# Patient Record
Sex: Female | Born: 1965 | Race: Black or African American | Hispanic: No | Smoking: Never smoker
Health system: Southern US, Community
[De-identification: ages and names within clinical notes are randomized; demographics above are authoritative.]

## PROBLEM LIST (undated history)

## (undated) DIAGNOSIS — I7300 Raynaud's syndrome without gangrene: Secondary | ICD-10-CM

## (undated) DIAGNOSIS — IMO0001 Reserved for inherently not codable concepts without codable children: Secondary | ICD-10-CM

## (undated) DIAGNOSIS — M349 Systemic sclerosis, unspecified: Secondary | ICD-10-CM

## (undated) DIAGNOSIS — C9000 Multiple myeloma not having achieved remission: Secondary | ICD-10-CM

## (undated) DIAGNOSIS — IMO0002 Reserved for concepts with insufficient information to code with codable children: Secondary | ICD-10-CM

## (undated) HISTORY — PX: NASAL SEPTUM SURGERY: SHX37

## (undated) HISTORY — DX: Reserved for inherently not codable concepts without codable children: IMO0001

## (undated) HISTORY — DX: Systemic sclerosis, unspecified: M34.9

## (undated) HISTORY — DX: Multiple myeloma not having achieved remission: C90.00

## (undated) HISTORY — DX: Reserved for concepts with insufficient information to code with codable children: IMO0002

## (undated) HISTORY — PX: UTERINE FIBROID EMBOLIZATION: SHX825

---

## 2013-02-14 ENCOUNTER — Emergency Department (INDEPENDENT_AMBULATORY_CARE_PROVIDER_SITE_OTHER)
Admission: EM | Admit: 2013-02-14 | Discharge: 2013-02-14 | Disposition: A | Discharge status: Home / Self Care | Payer: Self-pay | Source: Home / Self Care | Attending: Emergency Medicine | Admitting: Emergency Medicine

## 2013-02-14 ENCOUNTER — Encounter (HOSPITAL_COMMUNITY): Payer: Self-pay | Admitting: Emergency Medicine

## 2013-02-14 ENCOUNTER — Emergency Department (INDEPENDENT_AMBULATORY_CARE_PROVIDER_SITE_OTHER): Payer: Self-pay

## 2013-02-14 DIAGNOSIS — M65839 Other synovitis and tenosynovitis, unspecified forearm: Secondary | ICD-10-CM

## 2013-02-14 DIAGNOSIS — M65849 Other synovitis and tenosynovitis, unspecified hand: Secondary | ICD-10-CM

## 2013-02-14 DIAGNOSIS — M659 Synovitis and tenosynovitis, unspecified: Secondary | ICD-10-CM

## 2013-02-14 MED ORDER — METHYLPREDNISOLONE 4 MG PO KIT: PACK | ORAL | Status: DC

## 2013-02-14 NOTE — ED Provider Notes (Signed)
Medical screening examination/treatment/procedure(s) were performed by non-physician practitioner and as supervising physician I was immediately available for consultation/collaboration.  Raynald Blend, MD 02/14/13 [phone]

## 2013-02-14 NOTE — ED Provider Notes (Signed)
History    CSN: [id number] Arrival date & time 02/14/13  1243  First MD Initiated Contact with Patient 02/14/13 1408     Chief Complaint  Patient presents with  . Wrist Pain   (Consider location/radiation/quality/duration/timing/severity/associated sxs/prior Treatment) HPI  47 yo bf presents today with right wrist pain, stiffness, swelling.  States that she recently moved down to [place] and had been doing a lot of packing.  This activity aggravated current wrist symtpoms.  States that sep 2013 she did suffer a fall down some steps and landed on an outstretched right hand.  Was seen and advised that she did not have a fracture, but wrist has continued to be stiff since that injury.  Denies fever, chills, hx or inflammatory arthritis.  Pain aggravated with movement and lifting objects.    History reviewed. No pertinent past medical history. Past Surgical History  Procedure Laterality Date  . Nasal septum surgery     History reviewed. No pertinent family history. History  Substance Use Topics  . Smoking status: Never Smoker   . Smokeless tobacco: Not on file  . Alcohol Use: No   OB History   Grav Para Term Preterm Abortions TAB SAB Ect Mult Living                 Review of Systems  Constitutional: Negative.   HENT: Negative.   Eyes: Negative.   Respiratory: Negative.   Cardiovascular: Negative.   Gastrointestinal: Negative.   Endocrine: Negative.   Genitourinary: Negative.   Musculoskeletal:       Right wrist pain, swelling.    Skin: Negative.   Neurological: Negative.   Hematological: Negative.   Psychiatric/Behavioral: Negative.     Allergies  Review of patient's allergies indicates no known allergies.  Home Medications   Current Outpatient Rx  Name  Route  Sig  Dispense  Refill  . Multiple Vitamin (MULTIVITAMIN) tablet   Oral   Take 1 tablet by mouth daily.         . methylPREDNISolone (MEDROL DOSEPAK) 4 MG tablet      6 day dose pack.  Take as  directed.   21 tablet   0    BP 106/76  Pulse 73  Temp(Src) 97.8 F (36.6 C) (Oral)  Resp 16  SpO2 100% Physical Exam  Constitutional: She appears well-developed and well-nourished.  HENT:  Head: Normocephalic and atraumatic.  Eyes: Pupils are equal, round, and reactive to light.  Neck: Normal range of motion.  Cardiovascular: Normal rate and regular rhythm.   Pulmonary/Chest: Effort normal.  Musculoskeletal:  Right wrist decreased ROM.  Wrist flexor tendons tender to palpation.  Some swelling.  Positive tinel's  Pain with resisted wrist flexion.  No signs of infection.   Skin: Skin is warm and dry.  Psychiatric: She has a normal mood and affect.    ED Course  Procedures (including critical care time) Labs Reviewed - No data to display Dg Wrist Complete Right  02/14/2013   *RADIOLOGY REPORT*  Clinical Data: Wrist pain  RIGHT WRIST - COMPLETE 3+ VIEW  Comparison: None.  Findings: Four views of the wrist demonstrate no acute fracture or malalignment.  No significant widening of the scaphoid lunate interspace.  The scaphoid appears intact.  There is mild soft tissue swelling dorsal to the wrist joint.  No chondrocalcinosis. Normal bony mineralization.  Joint spaces are maintained.  There is mild volar tilt of the lunate.  IMPRESSION:  1.  No acute fracture or malalignment.  2.  Mild volar/palmar tilt of the lunate.  This can be seen with volar intercalated segmental instability (VISI), but can also be a normal finding in a relatively lax wrist.   Original Report Authenticated By: Malachy Moan, M.D.   1. Tenosynovitis, wrist     MDM  Will f/u with dr Salvatore Marvel in 1-2 weeks if continues to be symptomatic.  Instructions reviewed and she voices understanding.  Will work on ROM of her wrist.  May need formal therapy vs further imaging with MRI scan.   Meds ordered this encounter  Medications  . Multiple Vitamin (MULTIVITAMIN) tablet    Sig: Take 1 tablet by mouth daily.  .  methylPREDNISolone (MEDROL DOSEPAK) 4 MG tablet    Sig: 6 day dose pack.  Take as directed.    Dispense:  21 tablet    Refill:  0    Zonia Kief, PA-C 02/14/13 1620

## 2013-02-14 NOTE — ED Notes (Signed)
Pt c/o right wrist swelling x 3 days. Has been moving furniture and had lots of activity prior to onset of symptoms. Has used wrist bandage and Aleve for pain with relief. Was at PT today and told she should have it looked at by a doctor. Patient is alert and oriented.

## 2013-10-18 ENCOUNTER — Emergency Department (HOSPITAL_BASED_OUTPATIENT_CLINIC_OR_DEPARTMENT_OTHER)
Admission: EM | Admit: 2013-10-18 | Discharge: 2013-10-19 | Disposition: A | Discharge status: Home / Self Care | Payer: BC Managed Care – PPO | Attending: Emergency Medicine | Admitting: Emergency Medicine

## 2013-10-18 ENCOUNTER — Encounter (HOSPITAL_BASED_OUTPATIENT_CLINIC_OR_DEPARTMENT_OTHER): Payer: Self-pay | Admitting: Emergency Medicine

## 2013-10-18 ENCOUNTER — Emergency Department (HOSPITAL_BASED_OUTPATIENT_CLINIC_OR_DEPARTMENT_OTHER): Payer: BC Managed Care – PPO

## 2013-10-18 DIAGNOSIS — Y92838 Other recreation area as the place of occurrence of the external cause: Secondary | ICD-10-CM

## 2013-10-18 DIAGNOSIS — Z79899 Other long term (current) drug therapy: Secondary | ICD-10-CM | POA: Insufficient documentation

## 2013-10-18 DIAGNOSIS — S060X9A Concussion with loss of consciousness of unspecified duration, initial encounter: Secondary | ICD-10-CM | POA: Insufficient documentation

## 2013-10-18 DIAGNOSIS — W1809XA Striking against other object with subsequent fall, initial encounter: Secondary | ICD-10-CM | POA: Insufficient documentation

## 2013-10-18 DIAGNOSIS — S0990XA Unspecified injury of head, initial encounter: Secondary | ICD-10-CM

## 2013-10-18 DIAGNOSIS — Y93A1 Activity, exercise machines primarily for cardiorespiratory conditioning: Secondary | ICD-10-CM | POA: Insufficient documentation

## 2013-10-18 DIAGNOSIS — Y9239 Other specified sports and athletic area as the place of occurrence of the external cause: Secondary | ICD-10-CM | POA: Insufficient documentation

## 2013-10-18 MED ORDER — IBUPROFEN 800 MG PO TABS
ORAL_TABLET | ORAL | Status: AC
  Administered 2013-10-19: 800 mg
  Filled 2013-10-18: qty 1

## 2013-10-18 MED ORDER — ACETAMINOPHEN 325 MG PO TABS
ORAL_TABLET | ORAL | Status: AC
  Filled 2013-10-18: qty 2

## 2013-10-18 NOTE — ED Provider Notes (Signed)
CSN: [id number]     Arrival date & time 10/18/13  2302 History  This chart was scribed for Lety Cullens Alfonso Patten, MD by Eston Mould, ED Scribe. This patient was seen in room MH10/MH10 and the patient's care was started at 11:50 PM.   Chief Complaint  Patient presents with  . Headache   Patient is a 48 y.o. female presenting with headaches. The history is provided by the patient. No language interpreter was used.  Headache Pain location:  Generalized Quality:  Sharp and dull Radiates to:  Does not radiate Onset quality:  Gradual Duration:  2 weeks Timing:  Constant Progression:  Waxing and waning Chronicity:  New Context: not activity, not exposure to bright light, not caffeine, not eating, not intercourse and not loud noise   Relieved by:  Nothing Worsened by:  Nothing tried Ineffective treatments:  None tried Associated symptoms: no abdominal pain, no cough, no fever, no nausea, no neck pain, no neck stiffness, no numbness, no photophobia, no seizures, no sore throat, no swollen glands, no visual change and no vomiting   Risk factors: no family hx of SAH    HPI Comments: Cameo Schmiesing is a 48 y.o. female who presents to the Emergency Department complaining of waxing and waning HA that began 2 weeks ago. Pt states about 2 weeks ago she fell while at the gym and states she LOC but is unsure how long. She states she frequently goes to the gym and sits in the sauna for long periods of time. Pt denies taking any OTC medications. Pt denies nausea and emesis.   History reviewed. No pertinent past medical history. Past Surgical History  Procedure Laterality Date  . Nasal septum surgery     History reviewed. No pertinent family history. History  Substance Use Topics  . Smoking status: Never Smoker   . Smokeless tobacco: Not on file  . Alcohol Use: No   OB History   Grav Para Term Preterm Abortions TAB SAB Ect Mult Living                 Review of Systems   Constitutional: Negative for fever.  HENT: Negative for sore throat.   Eyes: Negative for photophobia.  Respiratory: Negative for cough.   Gastrointestinal: Negative for nausea, vomiting and abdominal pain.  Musculoskeletal: Negative for neck pain and neck stiffness.  Neurological: Positive for headaches. Negative for seizures, facial asymmetry, speech difficulty, weakness, light-headedness and numbness.  All other systems reviewed and are negative.    Allergies  Review of patient's allergies indicates no known allergies.  Home Medications   Current Outpatient Rx  Name  Route  Sig  Dispense  Refill  . methylPREDNISolone (MEDROL DOSEPAK) 4 MG tablet      6 day dose pack.  Take as directed.   21 tablet   0   . Multiple Vitamin (MULTIVITAMIN) tablet   Oral   Take 1 tablet by mouth daily.          There were no vitals taken for this visit.  Physical Exam  Constitutional: She is oriented to person, place, and time. She appears well-developed and well-nourished. No distress.  sITTING COMFORTABLY IN THE ROOM WITH THE LIGHTS ON  HENT:  Head: Normocephalic and atraumatic. Head is without raccoon's eyes and without Battle's sign.  Right Ear: No mastoid tenderness. Tympanic membrane is not injected. No hemotympanum.  Left Ear: No mastoid tenderness. Tympanic membrane is not injected. No hemotympanum.  Mouth/Throat: Oropharynx is clear and  moist. No oropharyngeal exudate.  No hemotympanum L and R. Mastoids are normal. Trachea is midline.  nO TEMPORAL TENDERNESS  Eyes: EOM are normal. Pupils are equal, round, and reactive to light.  Neck: Normal range of motion. Neck supple. No thyromegaly present.  No meningismus .   Cardiovascular: Normal rate and normal heart sounds.   Pulmonary/Chest: Effort normal and breath sounds normal. No stridor. She has no wheezes. She has no rales.  Abdominal: Soft. Bowel sounds are normal. There is no tenderness. There is no rebound and no guarding.   Soft non-tender abdomen.  Musculoskeletal: Normal range of motion. She exhibits no edema and no tenderness.  No midline or stepoffs. C-spine normal. 5/5 strength and sensation to extremities.  Neurological: She is alert and oriented to person, place, and time. She has normal reflexes. She displays normal reflexes. No cranial nerve deficit. Coordination normal.  Cranial nerves 1-12.   Skin: Skin is warm and dry. She is not diaphoretic.  Psychiatric: She has a normal mood and affect.    ED Course  Procedures  DIAGNOSTIC STUDIES:   COORDINATION OF CARE: 11:55 PM-Discussed treatment plan which includes informed pt will return to her room when CT results are in and will administer pain medication while in ED. Pt agreed to plan.   Labs Review Labs Reviewed - No data to display Imaging Review No results found.   EKG Interpretation None     MDM   Final diagnoses:  None   Post concussive syndrome Patient has tried nothing, have advised tylenol and motrin.  Follow up with your family doctor, no activity until symptoms resolve  No exam data present I personally performed the services described in this documentation, which was scribed in my presence. The recorded information has been reviewed and is accurate.     Carlisle Beers, MD 10/19/13 [phone]

## 2013-10-18 NOTE — ED Notes (Signed)
Pt states that she fell at the gym 2 weeks ago, hit right parietal/occipatal side of head on treadmill, states that she lost consiousness but unsure how long she was out, was helped up by other members to lounge, she sat there and felt much better. Pt states prior to episode she had not eaten and had sat in sauna for a longer period of time. Today presents because of headache that  Waxes and wanes to frontal portion of head and back of head.

## 2013-10-19 MED ORDER — ACETAMINOPHEN 500 MG PO TABS
ORAL_TABLET | ORAL | Status: AC
Start: 2013-10-19 — End: 2013-10-19
  Administered 2013-10-19: 1000 mg
  Filled 2013-10-19: qty 2

## 2013-10-19 NOTE — Discharge Instructions (Signed)
Concussion, Adult  A concussion is a brain injury. It is caused by:  · A hit to the head.  · A quick and sudden movement (jolt) of the head or neck.  A concussion is usually not life-threatening. Even so, it can cause serious problems. If you had a concussion before, you may have concussion-like problems after a hit to your head.  HOME CARE  General Instructions  · Follow your doctor's directions carefully.  · Take medicines only as told by your doctor.  · Only take medicines your doctor says are safe.  · Do not drink alcohol until your doctor says it is OK. Alcohol and some drugs can slow down healing. They can also put you at risk for further injury.  · If your are having trouble remembering things, write them down.  · Try to do one thing at a time if you get distracted easily. For example, do not watch TV while making dinner.  · Talk to your family members or close friends when making important decisions.  · Follow up with your doctor as told.  · Watch your symptoms. Tell others to do the same. Serious problems can sometimes happen after a concussion. Older adults are more likely to have these problems.  · Tell your teachers, school nurse, school counselor, coach, athletic trainer, or work manager about your concussion. Tell them about what you can or cannot do. They should watch to see if:  · It gets even harder for you to pay attention or concentrate.  · It gets even harder for you to remember things or learn new things.  · You need more time than normal to finish things.  · You become annoyed (irritable) more than before.  · You are not able to deal with stress as well.  · You have more problems than before.  · Rest. Make sure you:  · Get plenty of sleep at night.  · Go to sleep early.  · Go to bed at the same time every day. Try to wake up at the same time.  · Rest during the day.  · Take naps when you feel tired.  · Limit activities where you have to think a lot or concentrate. These include:  · Doing  homework.  · Doing work related to a job.  · Watching TV.  · Using the computer.  Returning To Your Regular Activities  Return to your normal activities slowly, not all at once. You must give your body and brain enough time to heal.   · Do not play sports or do other athletic activities until your doctor says it is OK.  · Ask your doctor when you can drive, ride a bicycle, or work other vehicles or machines. Never do these things if you feel dizzy.  · Ask your doctor about when you can return to work or school.  Preventing Another Concussion  It is very important to avoid another brain injury, especially before you have healed. In rare cases, another injury can lead to permanent brain damage, brain swelling, or death. The risk of this is greatest during the first 7 10 after your injury. Avoid injuries by:   · Wearing a seat belt when riding in a car.  · Not drinking too much alcohol.  · Avoiding activities that could lead to a second concussion (such as contact sports).  · Wearing a helmet when doing activities like:  · Biking.  · Skiing.  · Skateboarding.  · Skating.  ·   Making your home safer by:  · Removing things from the floor or stairways that could make you trip.  · Using grab bars in bathrooms and handrails by stairs.  · Placing non-slip mats on floors and in bathtubs.  · Improve lighting in dark areas.  GET HELP IF:  · It gets even harder for you to pay attention or concentrate.  · It gets even harder for you to remember things or learn new things.  · You need more time than normal to finish things.  · You become annoyed (irritable) more than before.  · You are not able to deal with stress as well.  · You have more problems than before.  · You have problems keeping your balance.  · You are not able to react quickly when you should.  Get help if you have any of these problems for more than 2 weeks:   · Lasting (chronic) headaches.  · Dizziness or trouble balancing.  · Feeling sick to your stomach  (nausea).  · Seeing (vision) problems.  · Being affected by noises or light more than normal.  · Feeling sad, low, down in the dumps, blue, gloomy, or empty (depressed).  · Mood changes (mood swings).  · Feeling of fear or nervousness about what may happen (anxiety).  · Feeling annoyed.  · Memory problems.  · Problems concentrating or paying attention.  · Sleep problems.  · Feeling tired all the time.  GET HELP RIGHT AWAY IF:   · You have bad headaches or your headaches get worse.  · You have weakness (even if it is in one hand, leg, or part of the face).  · You have loss of feeling (numbness).  · You feel off balance.  · You keep throwing up (vomiting).  · You feel tired.  · One black center of your eye (pupil) is larger than the other.  · You twitch or shake violently (convulse).  · Your speech is not clear (slurred).  · You are more confused, easily angered (agitated), or annoyed than before.  · You have more trouble resting than before.  · You are unable to recognize people or places.  · You have neck pain.  · It is difficult to wake you up.  · You have unusual behavior changes.  · You pass out (lose consciousness).  MAKE SURE YOU:   · Understand these instructions.  · Will watch your condition.  · Will get help right away if you are not doing well or get worse.  Document Released: 07/27/2009 Document Revised: 05/29/2013 Document Reviewed: 02/28/2013  [employer]® Patient Information ©2014 [employer].

## 2014-06-27 ENCOUNTER — Ambulatory Visit (INDEPENDENT_AMBULATORY_CARE_PROVIDER_SITE_OTHER): Payer: BC Managed Care – PPO | Admitting: Emergency Medicine

## 2014-06-27 ENCOUNTER — Other Ambulatory Visit: Payer: Self-pay | Admitting: Emergency Medicine

## 2014-06-27 ENCOUNTER — Ambulatory Visit (INDEPENDENT_AMBULATORY_CARE_PROVIDER_SITE_OTHER): Payer: BC Managed Care – PPO

## 2014-06-27 VITALS — BP 131/85 | HR 101 | Temp 98.0°F | Resp 18 | Ht 64.0 in | Wt 142.6 lb

## 2014-06-27 DIAGNOSIS — M349 Systemic sclerosis, unspecified: Secondary | ICD-10-CM

## 2014-06-27 DIAGNOSIS — M25512 Pain in left shoulder: Secondary | ICD-10-CM

## 2014-06-27 LAB — POCT CBC
Granulocyte percent: 66.3 %G (ref 37–80)
HCT, POC: 37 % — AB (ref 37.7–47.9)
Hemoglobin: 11.2 g/dL — AB (ref 12.2–16.2)
Lymph, poc: 3.2 (ref 0.6–3.4)
MCH: 29.6 pg (ref 27–31.2)
MCHC: 30.2 g/dL — AB (ref 31.8–35.4)
MCV: 97.7 fL — AB (ref 80–97)
MID (CBC): 0.8 (ref 0–0.9)
MPV: 6.7 fL (ref 0–99.8)
PLATELET COUNT, POC: 301 10*3/uL (ref 142–424)
POC Granulocyte: 7.9 — AB (ref 2–6.9)
POC LYMPH %: 27.2 % (ref 10–50)
POC MID %: 6.5 %M (ref 0–12)
RBC: 3.79 M/uL — AB (ref 4.04–5.48)
RDW, POC: 15.4 %
WBC: 11.9 10*3/uL — AB (ref 4.6–10.2)

## 2014-06-27 NOTE — Progress Notes (Addendum)
Subjective:    Patient ID: Samantha Moyer, female    DOB: 06/29/1966, 48 y.o.   MRN: [id number] This chart was scribed for [place]. Everlene Farrier, MD by Steva Colder, ED Scribe. The patient was seen in room 9 at 7:30 PM.   Chief Complaint  Patient presents with  . Arm Pain    l arm    HPI Samantha Moyer is a 48 y.o. female who presents today complaining of left arm pain onset 1 week. She states that she went to [facility] and was Rx for her left arm pain with no relief. She states that she didn't get an X-ray. She states that any movement or lifting over five lbs causing her pain. She denies hip pain and any other symptoms.   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 She states that no one follows her for her condition. 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 She states that she was informed by her doctors to move to a warmer climate. She states that her scleroderma comes on by stress. She states that she caught the H1N1 virus in 2009 and was intubated for 5 days and that is when she was dx with scelroderma. She states that she has an appointment in December.    There are no active problems to display for this patient.  Past Medical History  Diagnosis Date  . Scleroderma    Past Surgical History  Procedure Laterality Date  . Nasal septum surgery     No Known Allergies Prior to Admission medications   Medication Sig Start Date End Date Taking? Authorizing Provider  methylPREDNISolone (MEDROL DOSEPAK) 4 MG tablet 6 day dose pack.  Take as directed. 02/14/13   Benjiman Core, PA-C  Multiple Vitamin (MULTIVITAMIN) tablet Take 1 tablet by mouth daily.    Historical Provider, MD     Review of Systems  Musculoskeletal: Positive for arthralgias (left shoulder ).       No hip pain  All other systems reviewed and are negative.      Objective:   Physical Exam  Constitutional: She is  oriented to person, place, and time. She appears well-developed and well-nourished. No distress.  Alert and cooperative  HENT:  Head: Normocephalic and atraumatic.  Eyes: EOM are normal.  Neck: Neck supple. No tracheal deviation present.  Cardiovascular: Normal rate, regular rhythm and normal heart sounds.   Pulmonary/Chest: Effort normal. No respiratory distress.  Musculoskeletal: Normal range of motion. She exhibits tenderness.  Tenderness with any abduction of the L shoulder. Pain with internal and external rotation of the L shoulder. Tenderness of the proximal L arm. Resorption of the bone to the tips of all fingers. Radial and ulnar pulses are nl.   Neurological: She is alert and oriented to person, place, and time.  Skin: Skin is warm and dry.  Psychiatric: She has a normal mood and affect. Her behavior is normal.  Nursing note and vitals reviewed.    [facility] reading (PRIMARY) by  Dr. Fredrich Birks appears to be a radiolucency involving the humerus head. Normal bony architecture is gone very concerning for a lytic lesion please comment  Results for orders placed or performed in visit on 06/27/14  POCT CBC  Result Value Ref Range   WBC 11.9 (A) 4.6 - 10.2 K/uL   Lymph, poc 3.2 0.6 - 3.4   POC LYMPH  PERCENT 27.2 10 - 50 %L   MID (cbc) 0.8 0 - 0.9   POC MID % 6.5 0 - 12 %M   POC Granulocyte 7.9 (A) 2 - 6.9   Granulocyte percent 66.3 37 - 80 %G   RBC 3.79 (A) 4.04 - 5.48 M/uL   Hemoglobin 11.2 (A) 12.2 - 16.2 g/dL   HCT, POC 37.0 (A) 37.7 - 47.9 %   MCV 97.7 (A) 80 - 97 fL   MCH, POC 29.6 27 - 31.2 pg   MCHC 30.2 (A) 31.8 - 35.4 g/dL   RDW, POC 15.4 %   Platelet Count, POC 301 142 - 424 K/uL   MPV 6.7 0 - 99.8 fL     BP 131/85 mmHg  Pulse 101  Temp(Src) 98 F (36.7 C) (Oral)  Resp 18  Wt 142 lb 9.6 oz (64.683 kg)  Assessment & Plan:  COORDINATION OF CARE: 7:41 PM-Discussed treatment plan which includes left shoulder and left arm X-ray with pt at bedside and pt agreed to  plan. X-ray reveals a large lytic lesion involving the humerus. I did not see any suspicious lesions on chest x-ray. Differential includes cancer versus aseptic changes versus infection. We'll proceed with MRI of the shoulder referral to Dr. Amil Amen regarding her scleroderma referral to orthopedic surgery.  I personally performed the services described in this documentation, which was scribed in my presence. The recorded information has been reviewed and is accurate.

## 2014-06-28 LAB — COMPLETE METABOLIC PANEL WITH GFR
ALK PHOS: 81 U/L (ref 39–117)
ALT: 14 U/L (ref 0–35)
AST: 21 U/L (ref 0–37)
Albumin: 3.1 g/dL — ABNORMAL LOW (ref 3.5–5.2)
BUN: 11 mg/dL (ref 6–23)
CO2: 29 mEq/L (ref 19–32)
CREATININE: 0.8 mg/dL (ref 0.50–1.10)
Calcium: 9.7 mg/dL (ref 8.4–10.5)
Chloride: 99 mEq/L (ref 96–112)
GFR, Est African American: >89 mL/min
GFR, Est Non African American: 86 mL/min
Glucose, Bld: 86 mg/dL (ref 70–99)
Potassium: 4.4 mEq/L (ref 3.5–5.3)
Sodium: 139 mEq/L (ref 135–145)
Total Bilirubin: 0.2 mg/dL (ref 0.2–1.2)
Total Protein: 9.7 g/dL — ABNORMAL HIGH (ref 6.0–8.3)

## 2014-06-28 LAB — SEDIMENTATION RATE: SED RATE: 118 mm/h — AB (ref 0–22)

## 2014-07-01 ENCOUNTER — Telehealth: Payer: Self-pay | Admitting: Physician Assistant

## 2014-07-01 DIAGNOSIS — Z1239 Encounter for other screening for malignant neoplasm of breast: Secondary | ICD-10-CM

## 2014-07-01 LAB — PROTEIN, TOTAL: TOTAL PROTEIN: 9.7 g/dL — AB (ref 6.0–8.3)

## 2014-07-01 MED ORDER — HYDROCODONE-ACETAMINOPHEN 5-325 MG PO TABS: 1.0000 | ORAL_TABLET | Freq: Four times a day (QID) | ORAL | Status: DC | PRN

## 2014-07-01 MED ORDER — TRAMADOL HCL 50 MG PO TABS: 50.0000 mg | ORAL_TABLET | Freq: Three times a day (TID) | ORAL | Status: DC | PRN

## 2014-07-01 NOTE — Telephone Encounter (Signed)
Lm for pt to pick up scripts- rtn call if any questions.

## 2014-07-01 NOTE — Telephone Encounter (Signed)
Pt is only taking Naproxen. She is having trouble sleeping at night. She is afraid it is getting worse. She can not stand the feeling like her arm is coming out of socket. She would like something stronger for during the day that she will still be able to function and something for night.

## 2014-07-01 NOTE — Telephone Encounter (Signed)
Absolutely.  She will need to come into the office to pick up a Rx.

## 2014-07-01 NOTE — Telephone Encounter (Signed)
Patient called back has mri scheduled for this Thursday the 12th. She is having problems at night sleeping cause of the pain- she says this is because her bone is deterioting or not enough blood in bone- per radiologist. She has medication but the pain makes it very difficult to sleep; she says it feels like her bone is tearing on the the inside for about 3 weeks. What can we do to help her through this, please advise.   Best: [phone]

## 2014-07-01 NOTE — Telephone Encounter (Signed)
LMOM about getting patient to call and schedule her MRI.  Asked her to please call us back if she was having problems.

## 2014-07-02 ENCOUNTER — Ambulatory Visit (INDEPENDENT_AMBULATORY_CARE_PROVIDER_SITE_OTHER): Payer: BC Managed Care – PPO | Admitting: Family Medicine

## 2014-07-02 ENCOUNTER — Encounter (HOSPITAL_COMMUNITY): Payer: Self-pay | Admitting: Emergency Medicine

## 2014-07-02 ENCOUNTER — Ambulatory Visit (INDEPENDENT_AMBULATORY_CARE_PROVIDER_SITE_OTHER): Payer: BC Managed Care – PPO

## 2014-07-02 ENCOUNTER — Emergency Department (HOSPITAL_COMMUNITY)
Admission: EM | Admit: 2014-07-02 | Discharge: 2014-07-03 | Disposition: A | Discharge status: Home / Self Care | Payer: BC Managed Care – PPO | Attending: Emergency Medicine | Admitting: Emergency Medicine

## 2014-07-02 ENCOUNTER — Other Ambulatory Visit: Payer: BC Managed Care – PPO

## 2014-07-02 VITALS — BP 100/60 | HR 75 | Temp 97.6°F | Resp 16 | Ht 64.5 in | Wt 141.0 lb

## 2014-07-02 DIAGNOSIS — R51 Headache: Secondary | ICD-10-CM | POA: Insufficient documentation

## 2014-07-02 DIAGNOSIS — M899 Disorder of bone, unspecified: Secondary | ICD-10-CM | POA: Insufficient documentation

## 2014-07-02 DIAGNOSIS — R5383 Other fatigue: Secondary | ICD-10-CM

## 2014-07-02 DIAGNOSIS — T300 Burn of unspecified body region, unspecified degree: Secondary | ICD-10-CM

## 2014-07-02 DIAGNOSIS — R112 Nausea with vomiting, unspecified: Secondary | ICD-10-CM | POA: Diagnosis not present

## 2014-07-02 DIAGNOSIS — R519 Headache, unspecified: Secondary | ICD-10-CM

## 2014-07-02 DIAGNOSIS — Z79899 Other long term (current) drug therapy: Secondary | ICD-10-CM | POA: Diagnosis not present

## 2014-07-02 DIAGNOSIS — R42 Dizziness and giddiness: Secondary | ICD-10-CM

## 2014-07-02 DIAGNOSIS — M349 Systemic sclerosis, unspecified: Secondary | ICD-10-CM

## 2014-07-02 DIAGNOSIS — R110 Nausea: Secondary | ICD-10-CM

## 2014-07-02 DIAGNOSIS — IMO0002 Reserved for concepts with insufficient information to code with codable children: Secondary | ICD-10-CM

## 2014-07-02 DIAGNOSIS — R142 Eructation: Secondary | ICD-10-CM

## 2014-07-02 DIAGNOSIS — R012 Other cardiac sounds: Secondary | ICD-10-CM

## 2014-07-02 LAB — COMPREHENSIVE METABOLIC PANEL
ALK PHOS: 85 U/L (ref 39–117)
ALT: 11 U/L (ref 0–35)
AST: 21 U/L (ref 0–37)
Albumin: 2.8 g/dL — ABNORMAL LOW (ref 3.5–5.2)
Anion gap: 16 — ABNORMAL HIGH (ref 5–15)
BILIRUBIN TOTAL: 0.3 mg/dL (ref 0.3–1.2)
BUN: 10 mg/dL (ref 6–23)
CHLORIDE: 97 meq/L (ref 96–112)
CO2: 27 meq/L (ref 19–32)
CREATININE: 0.61 mg/dL (ref 0.50–1.10)
Calcium: 9.9 mg/dL (ref 8.4–10.5)
GFR calc Af Amer: >90 mL/min (ref >90)
Glucose, Bld: 84 mg/dL (ref 70–99)
POTASSIUM: 4.2 meq/L (ref 3.7–5.3)
Sodium: 140 mEq/L (ref 137–147)
Total Protein: 9.6 g/dL — ABNORMAL HIGH (ref 6.0–8.3)

## 2014-07-02 LAB — CBC WITH DIFFERENTIAL/PLATELET
BASOS ABS: 0 10*3/uL (ref 0.0–0.1)
Basophils Relative: 0 % (ref 0–1)
Eosinophils Absolute: 0.1 10*3/uL (ref 0.0–0.7)
Eosinophils Relative: 1 % (ref 0–5)
HEMATOCRIT: 34.4 % — AB (ref 36.0–46.0)
HEMOGLOBIN: 10.9 g/dL — AB (ref 12.0–15.0)
LYMPHS PCT: 24 % (ref 12–46)
Lymphs Abs: 2.8 10*3/uL (ref 0.7–4.0)
MCH: 30.7 pg (ref 26.0–34.0)
MCHC: 31.7 g/dL (ref 30.0–36.0)
MCV: 96.9 fL (ref 78.0–100.0)
MONO ABS: 0.7 10*3/uL (ref 0.1–1.0)
Monocytes Relative: 7 % (ref 3–12)
NEUTROS ABS: 7.8 10*3/uL — AB (ref 1.7–7.7)
NEUTROS PCT: 68 % (ref 43–77)
Platelets: 282 10*3/uL (ref 150–400)
RBC: 3.55 MIL/uL — ABNORMAL LOW (ref 3.87–5.11)
RDW: 12.9 % (ref 11.5–15.5)
WBC: 11.4 10*3/uL — AB (ref 4.0–10.5)

## 2014-07-02 LAB — TROPONIN I

## 2014-07-02 MED ORDER — ONDANSETRON 4 MG PO TBDP
4.0000 mg | ORAL_TABLET | Freq: Once | ORAL | Status: AC
  Administered 2014-07-02: 4 mg via ORAL
  Filled 2014-07-02: qty 1

## 2014-07-02 MED ORDER — SILVER SULFADIAZINE 1 % EX CREA: 1.0000 "application " | TOPICAL_CREAM | Freq: Every day | CUTANEOUS | Status: DC

## 2014-07-02 MED ORDER — ONDANSETRON HCL 4 MG/2ML IJ SOLN
4.0000 mg | Freq: Once | INTRAMUSCULAR | Status: DC
  Filled 2014-07-02: qty 2

## 2014-07-02 MED ORDER — MECLIZINE HCL 25 MG PO TABS: 25.0000 mg | ORAL_TABLET | Freq: Once | ORAL | Status: DC

## 2014-07-02 MED ORDER — SODIUM CHLORIDE 0.9 % IV SOLN
1000.0000 mL | INTRAVENOUS | Status: DC
  Administered 2014-07-03: 1000 mL via INTRAVENOUS

## 2014-07-02 MED ORDER — SODIUM CHLORIDE 0.9 % IV SOLN
1000.0000 mL | Freq: Once | INTRAVENOUS | Status: AC
  Administered 2014-07-03: 1000 mL via INTRAVENOUS

## 2014-07-02 MED ORDER — ONDANSETRON 4 MG PO TBDP
4.0000 mg | ORAL_TABLET | Freq: Once | ORAL | Status: AC
  Administered 2014-07-02: 4 mg via ORAL

## 2014-07-02 NOTE — ED Notes (Signed)
Pt. reports nausea , vomitting , lightheaded with headache onset today seen at [facility]  today sent here for further evaluation .

## 2014-07-02 NOTE — Telephone Encounter (Signed)
If the nausea and dizziness persist, she should be evaluated either here or in the ED.  If it's gone, I recommend that she try either 1/2 dose of the tramadol, or try the hydrocodone (she could start with a half dose of that if desired).  We expect to know more when we get the MRI results.

## 2014-07-02 NOTE — Telephone Encounter (Signed)
Pt called again. States she took the Tramadol last night at 9 and woke up this morning feeling very nauseous and dizzy. Has not taken the Hydrocodone. Pt doesn't know what to do. Please advise. Thanks

## 2014-07-02 NOTE — Progress Notes (Addendum)
Subjective:   This chart was scribed for Samantha Agreste, MD at [facility] by Rayfield Citizen, medical scribe. This patient was seen in room 10 and the patient's care was started at 6:41 PM.     Patient ID: Samantha Moyer, female    DOB: 07-21-1966, 48 y.o.   MRN: [id number]  Chief Complaint  Patient presents with  . Nausea    From medication   There are no active problems to display for this patient.  Past Medical History  Diagnosis Date  . Scleroderma    Past Surgical History  Procedure Laterality Date  . Nasal septum surgery     No Known Allergies Prior to Admission medications   Medication Sig Start Date End Date Taking? Authorizing Provider  HYDROcodone-acetaminophen (NORCO/VICODIN) 5-325 MG per tablet Take 1 tablet by mouth every 6 (six) hours as needed for moderate pain. 07/01/14  Yes Sarah Alleen Borne, PA-C  methylPREDNISolone (MEDROL DOSEPAK) 4 MG tablet 6 day dose pack.  Take as directed. 02/14/13  Yes Benjiman Core, PA-C  Multiple Vitamin (MULTIVITAMIN) tablet Take 1 tablet by mouth daily.   Yes Historical Provider, MD  traMADol (ULTRAM) 50 MG tablet Take 1 tablet (50 mg total) by mouth every 8 (eight) hours as needed. 07/01/14  Yes Mancel Bale, PA-C   History   Social History  . Marital Status: Divorced    Spouse Name: N/A    Number of Children: N/A  . Years of Education: N/A   Occupational History  . Not on file.   Social History Main Topics  . Smoking status: Never Smoker   . Smokeless tobacco: Not on file  . Alcohol Use: No  . Drug Use: No  . Sexual Activity: Not on file   Other Topics Concern  . Not on file   Social History Narrative   HPI  Samantha Moyer is a 48 y.o. female  PCP: Salena Saner., MD  Patient was seen 5 days PTA by Dr. Everlene Farrier for left shoulder pain and left arm pain. History of scleroderma without specific treatment. She had an x-ray that showed a lytic lesion involving the humerus. Scheduled for an MRI of the  shoulder for further evaluation and was referred to Dr. Amil Amen for the scleroderma as well as orthopedic surgery for the shoulder pain. MRI is scheduled for tomorrow; see multiple phone messages in chart. (Patient notes that this was rescheduled for today but she was unable to make the appointment due to symptoms below.) Prescribed hydrocodone for increased pain. Phone call this morning; reported that the tramadol caused her to feel nauseous and dizzy.   HPI Comments: Samantha Moyer is a 48 y.o. female with past medical history of scleroderma who presents to the [facility] complaining of 1 day of nausea and lightheadedness, worsening on ambulation. She took one dose of the tramadol at 21:30 last night; she has not taken the hydrocodone. She reports that her symptoms began this morning, about thirty minutes after waking; she was reading at onset. She ate a light meal at lunch and vomited 1x; she has been drinking small amounts of water. She denies diarrhea, urinary symptoms (dysuria, hematuria). 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   She also reports an intermittent frontal and right temple headache, described as a "brain freeze" sensation, for the past several weeks. The headaches resurfaced this afternoon and  were similar to prior headaches. She has a small burn from a hair styling injury on her scalp; she reports no change in her headache symptoms from this burn. She denies slurred speech or hemiparesis. She has no temporal headache at this time.   She also reports flushed feelings and sweating, particularly on her arms and groin, beginning last night before bed and continuing today.   She reports that she has been somnolent and fatigued today; she is concerned about falling asleep tonight. She lives alone and this bothers her in particular.   Patient  reports a history of a heart murmur; when she was "in [place], they told her there was a skip," based on an EKG. She denies any chest pain, pressure or heaviness.   Review of Systems  Cardiovascular: Negative for chest pain.  Gastrointestinal: Positive for nausea and vomiting. Negative for diarrhea.  Genitourinary: Negative for dysuria and hematuria.  Neurological: Positive for light-headedness and headaches. Negative for speech difficulty and weakness.      Objective:   Physical Exam  Constitutional: She is oriented to person, place, and time. She appears well-developed and well-nourished.  HENT:  Head: Normocephalic and atraumatic.  No tenderness along the temple or along the frontal sinuse; moist oral mucosa, no intraoral lesions  Eyes:  Slight headache with EOM testing; no nystagmus   Neck: No tracheal deviation present.  Cardiovascular: Normal rate.   Split S1 but otherwise no murmur  Pulmonary/Chest: Effort normal.  Abdominal: Soft. She exhibits no distension. There is no tenderness.  Neurological: She is alert and oriented to person, place, and time.  Grip strength equal; unsteady on Rhomberg but corrects; no pronator drift (guarding with left shoulder pain in elevation of left arm)   Skin: Skin is warm and dry.  Neck: No visible rash but faint erythema on anterior lateral neck Scalp: small (approx 1cm) second degree burn of the right frontal scalp Hands: callused ulcers on hands and clubbing on fingertips  Psychiatric: She has a normal mood and affect. Her behavior is normal.  Nursing note and vitals reviewed.  7:40 PM EKG; sinus rhythm; RSR prime in V1; left atrial enlargement; no acute findings otherwise   Filed Vitals:   07/02/14 1754  BP: 100/60  Pulse: 75  Temp: 97.6 F (36.4 C)  TempSrc: Oral  Resp: 16  Height: 5' 4.5" (1.638 m)  Weight: 141 lb (63.957 kg)  SpO2: 98%      Assessment & Plan:   7:19 PM Discussed coordination of care; patient will travel in a  private vehicle to the ED for further testing (including blood work, as patient is a tough stick). Will check EKG and abdominal xray in office.   8:18 PM [facility] Primary reading by Dr. Carlota Raspberry for acute abdominal series. Nonspecific bowel gas findings with suspected gastric bubble on left.   Meline Russaw is a 48 y.o. female Dizziness - Plan: EKG 12-Lead  Non-intractable vomiting with nausea, vomiting of unspecified type - Plan: ondansetron (ZOFRAN-ODT) disintegrating tablet 4 mg, DG Abd Acute W/Chest  Nonintractable headache, unspecified chronicity pattern, unspecified headache type  Split S1 (first heart sound) - Plan: EKG 12-Lead  Other fatigue  Scleroderma - Plan: DG Abd Acute W/Chest  Belching - Plan: DG Abd Acute W/Chest  Burn, second degree - Plan: silver sulfADIAZINE (SILVADENE) 1 % cream  Acute onset of nausea/vomiting, dizziness, and fatigue about 9:30 this am. Did have single tramadol yesterday evening, but persistent symptoms as day has progressed.  Underlying scleroderma may  impact gastric motility, but also with fatigue/sedation and dizziness.  Has had fronto-temporal HA's past 3 weeks with intermittent HA today, but not initially in office. Overall nonfocal neuro exam.  NO acute findings on EKG - possible atrial enlargement and split S1 On exam - may need echo at some point, but no chest pain or objective fever.   -Discussed CBC in office, send out labs vs ER eval tonight for possible labwork +/- imaging. She has had difficulty with blood draws by report, so decided on ER eval for bloodwork and other eval TBD. Triage nurse advised, sent by private vehicle.   2nd degree burn on scalp - appears stable.healing. Silvadene cream Rx for next 5-7 days, rtc precautions.     Meds ordered this encounter  Medications  . ondansetron (ZOFRAN-ODT) disintegrating tablet 4 mg    Sig:   . silver sulfADIAZINE (SILVADENE) 1 % cream    Sig: Apply 1 application topically daily.    Dispense:   50 g    Refill:  0   Patient Instructions  Go to [facility] for further evaluation as discussed for your lightheadedness, dizziness, nausea, and fatigue/sedation today. It would be less likely that the ultram yesterday would still be causing these symptoms, especially as not improving as day has progressed. I advised the triage staff at [facility], and they are expecting you.   For the burn on your head, I prescribed silvadene cream to use for the next week once per day and wash with soap and water each day. Return to the clinic or go to the nearest emergency room if any of your symptoms worsen or new symptoms occur.      I personally performed the services described in this documentation, which was scribed in my presence. The recorded information has been reviewed and considered, and addended by me as needed.

## 2014-07-02 NOTE — ED Notes (Signed)
IV insertion unsuccessful x 2. IV team to be consulted.

## 2014-07-02 NOTE — Telephone Encounter (Signed)
Pt called again and stated she may have to go to the ER because she doesn't think it is anything else we can do for her, spoke with the clinical and she agreed that if she doesn't want to come back Her that would be her bet, pt can be reached at [phone] if needed

## 2014-07-02 NOTE — Telephone Encounter (Signed)
States she rescheduled MRI for this coming Monday because the meds she was prescribed are making her sleepy and she cannot drive.

## 2014-07-02 NOTE — Patient Instructions (Signed)
Go to [facility] ER for further evaluation as discussed for your lightheadedness, dizziness, nausea, and fatigue/sedation today. It would be less likely that the ultram yesterday would still be causing these symptoms, especially as not improving as day has progressed. I advised the triage staff at [facility], and they are expecting you.   For the burn on your head, I prescribed silvadene cream to use for the next week once per day and wash with soap and water each day. Return to the clinic or go to the nearest emergency room if any of your symptoms worsen or new symptoms occur.

## 2014-07-02 NOTE — ED Provider Notes (Signed)
CSN: [id number]     Arrival date & time 07/02/14  2058 History   First MD Initiated Contact with Patient 07/02/14 2206     Chief Complaint  Patient presents with  . Emesis     (Consider location/radiation/quality/duration/timing/severity/associated sxs/prior Treatment) Patient is a 48 y.o. female presenting with vomiting. The history is provided by the patient.  Emesis She had nausea which started this morning. She vomited once but has had persistent nausea through the day. There is an associated dizziness which she described as a near syncopal sensation. Dizziness is worse with standing. She denies vertigo. She is also had a mild headache. She was seen at urgent care and was sent here for further evaluation. She relates symptoms to taking a dose of tramadol last night. Of note, she is treated for scleroderma. She had been receiving medication for left shoulder pain and has been on naproxen. She denies hearing loss or tinnitus. There's been no fever or chills. She denies chest pain.  Past Medical History  Diagnosis Date  . Scleroderma    Past Surgical History  Procedure Laterality Date  . Nasal septum surgery     Family History  Problem Relation Age of Onset  . Heart disease Mother   . Diabetes Mother   . Hypertension Mother   . Hyperlipidemia Mother    History  Substance Use Topics  . Smoking status: Never Smoker   . Smokeless tobacco: Not on file  . Alcohol Use: No   OB History    No data available     Review of Systems  Gastrointestinal: Positive for vomiting.  All other systems reviewed and are negative.     Allergies  Codeine and Tramadol  Home Medications   Prior to Admission medications   Medication Sig Start Date End Date Taking? Authorizing Provider  Multiple Vitamins-Minerals (ALIVE WOMENS ENERGY) TABS Take 1 tablet by mouth daily.   Yes Historical Provider, MD  naproxen (NAPROSYN) 500 MG tablet Take 500 mg by mouth 2 (two) times daily as needed for  moderate pain.   Yes Historical Provider, MD  naproxen sodium (ANAPROX) 220 MG tablet Take 220 mg by mouth daily as needed (for pain).   Yes Historical Provider, MD  traMADol (ULTRAM) 50 MG tablet Take 1 tablet (50 mg total) by mouth every 8 (eight) hours as needed. 07/01/14  Yes Mancel Bale, PA-C  HYDROcodone-acetaminophen (NORCO/VICODIN) 5-325 MG per tablet Take 1 tablet by mouth every 6 (six) hours as needed for moderate pain. 07/01/14   Mancel Bale, PA-C   BP 143/77 mmHg  Pulse 68  Temp(Src) 97.6 F (36.4 C)  Resp 16  Ht 5\' 5"  (1.651 m)  Wt 141 lb (63.957 kg)  BMI 23.46 kg/m2  SpO2 86% Physical Exam  Nursing note and vitals reviewed.  48 year old female, resting comfortably and in no acute distress. Vital signs are significant for borderline hypertension. Oxygen saturation is 86%, which is hypoxic, but follow-up oxygen saturation was 96% which is normal. Head is normocephalic and atraumatic. PERRLA, EOMI. Oropharynx is clear.healing burn is present in the midline of her forehead. Finding most Herbert Pun is present on and lateral gaze. This does not reproduce dizziness but she states that that has precipitated her headache. Neck is nontender and supple without adenopathy or JVD. Back is nontender and there is no CVA tenderness. Lungs are clear without rales, wheezes, or rhonchi. Chest is nontender. Heart has regular rate and rhythm without murmur. Abdomen is soft, flat, nontender  without masses or hepatosplenomegaly and peristalsis is hypoactive. Extremities have no cyanosis or edema, full range of motion is present. Skin is warm and dry without rash. Neurologic: Mental status is normal, cranial nerves are intact, there are no motor or sensory deficits. Dizziness is not reproduced by head movement.  ED Course  Procedures (including critical care time) Labs Review Results for orders placed or performed during the hospital encounter of 07/02/14  CBC with Differential  Result Value  Ref Range   WBC 11.4 (H) 4.0 - 10.5 K/uL   RBC 3.55 (L) 3.87 - 5.11 MIL/uL   Hemoglobin 10.9 (L) 12.0 - 15.0 g/dL   HCT 34.4 (L) 36.0 - 46.0 %   MCV 96.9 78.0 - 100.0 fL   MCH 30.7 26.0 - 34.0 pg   MCHC 31.7 30.0 - 36.0 g/dL   RDW 12.9 11.5 - 15.5 %   Platelets 282 150 - 400 K/uL   Neutrophils Relative % 68 43 - 77 %   Neutro Abs 7.8 (H) 1.7 - 7.7 K/uL   Lymphocytes Relative 24 12 - 46 %   Lymphs Abs 2.8 0.7 - 4.0 K/uL   Monocytes Relative 7 3 - 12 %   Monocytes Absolute 0.7 0.1 - 1.0 K/uL   Eosinophils Relative 1 0 - 5 %   Eosinophils Absolute 0.1 0.0 - 0.7 K/uL   Basophils Relative 0 0 - 1 %   Basophils Absolute 0.0 0.0 - 0.1 K/uL  Comprehensive metabolic panel  Result Value Ref Range   Sodium 140 137 - 147 mEq/L   Potassium 4.2 3.7 - 5.3 mEq/L   Chloride 97 96 - 112 mEq/L   CO2 27 19 - 32 mEq/L   Glucose, Bld 84 70 - 99 mg/dL   BUN 10 6 - 23 mg/dL   Creatinine, Ser 0.61 0.50 - 1.10 mg/dL   Calcium 9.9 8.4 - 10.5 mg/dL   Total Protein 9.6 (H) 6.0 - 8.3 g/dL   Albumin 2.8 (L) 3.5 - 5.2 g/dL   AST 21 0 - 37 U/L   ALT 11 0 - 35 U/L   Alkaline Phosphatase 85 39 - 117 U/L   Total Bilirubin 0.3 0.3 - 1.2 mg/dL   GFR calc non Af Amer >90 >90 mL/min   GFR calc Af Amer >90 >90 mL/min   Anion gap 16 (H) 5 - 15  Urinalysis, Routine w reflex microscopic  Result Value Ref Range   Color, Urine YELLOW YELLOW   APPearance CLOUDY (A) CLEAR   Specific Gravity, Urine 1.013 1.005 - 1.030   pH 6.0 5.0 - 8.0   Glucose, UA NEGATIVE NEGATIVE mg/dL   Hgb urine dipstick NEGATIVE NEGATIVE   Bilirubin Urine NEGATIVE NEGATIVE   Ketones, ur NEGATIVE NEGATIVE mg/dL   Protein, ur 30 (A) NEGATIVE mg/dL   Urobilinogen, UA 0.2 0.0 - 1.0 mg/dL   Nitrite NEGATIVE NEGATIVE   Leukocytes, UA NEGATIVE NEGATIVE  Troponin I  Result Value Ref Range   Troponin I <0.30 <0.30 ng/mL  Urine microscopic-add on  Result Value Ref Range   Squamous Epithelial / LPF FEW (A) RARE   WBC, UA 3-6 <3 WBC/hpf    Bacteria, UA RARE RARE   Dg Chest 2 View  06/28/2014   CLINICAL DATA:  Metastatic lesion involving the left humerus.  EXAM: CHEST  2 VIEW  COMPARISON:  Left humerus and shoulder radiographs - earlier same day  FINDINGS: Borderline enlarged cardiac silhouette and mediastinal contours with atherosclerotic plaque within the thoracic  aorta. Evaluation of the retrosternal clear space is obscured secondary to overlying soft tissues. Bibasilar heterogeneous interstitial opacities. No discrete focal airspace opacities or discrete pulmonary mass. No pleural effusion or pneumothorax. Known lytic lesion involving the medial aspect of the proximal humeral metaphysis is incompletely visualized. Regional soft tissues appear normal.  IMPRESSION: 1. Bibasilar interstitial opacities, nonspecific and while possibly represent atelectasis, atypical infection could have a similar appearance. 2. No discrete pulmonary mass. 3. Cardiomegaly without evidence of edema. 4. Known indeterminate lytic lesion involving the proximal humeral metaphysis is incompletely evaluated on this examination, though again worrisome for metastatic disease. Again, further evaluation with CT of the chest, abdomen and pelvis is recommended to evaluate for a primary malignancy.   Electronically Signed   By: Sandi Mariscal M.D.   On: 06/28/2014 08:02   Dg Shoulder Left  06/27/2014   CLINICAL DATA:  Shoulder pain, no trauma.  History of scleroderma.  EXAM: LEFT SHOULDER - 2+ VIEW  COMPARISON:  None.  FINDINGS: There is lytic destruction of the medial aspect of the left humeral neck. No displaced fracture is identified. The humeral head is properly located. No soft tissue abnormality.  IMPRESSION: Lytic lesion involving the medial aspect of the left proximal humerus. Differential considerations include metastasis, myeloma, or less likely osteomyelitis. Consider further evaluation with bone scan and/or correlation with lab exams to determine further appropriate  study such as PET-CT.   Electronically Signed   By: Conchita Paris M.D.   On: 06/27/2014 20:26   Dg Abd Acute W/chest  07/02/2014   CLINICAL DATA:  Nausea, lightheadedness, worsening with ambulation. Symptoms for 1 day. Diaphoresis.  EXAM: ACUTE ABDOMEN SERIES (ABDOMEN 2 VIEW & CHEST 1 VIEW)  COMPARISON:  06/27/2014  FINDINGS: Heart is borderline in size. Bibasilar interstitial opacities are stable since prior study. This could reflect atelectasis, a is scratch head atypical infection or chronic interstitial lung disease. No effusions.  Nonobstructive bowel gas pattern. No free air. No organomegaly or suspicious calcification. No acute bony abnormality.  IMPRESSION: No evidence of bowel obstruction or free air.  Stable interstitial opacities in the lung bases which could represent atelectasis, atypical infection or chronic interstitial lung disease.  Mild cardiomegaly.   Electronically Signed   By: Rolm Baptise M.D.   On: 07/02/2014 20:26   Dg Humerus Left  06/27/2014   CLINICAL DATA:  Shoulder pain.  EXAM: LEFT HUMERUS - 2+ VIEW  COMPARISON:  None.  FINDINGS: There is cortical erosion involving the proximal humerus extending from the metaphysis into the radial humeral head along the medial border. There is an irregular margin to this lytic process. No evidence of acute fracture dislocation.  IMPRESSION: Aggressive lytic lesion in the proximal femur is consistent with a metastatic lesion, primary bone tumor or less likely aggressive infection. Consider chest CT with contrast to evaluate for primary neoplasm.  These results will be called to the ordering clinician or representative by the Radiologist Assistant, and communication documented in the PACS or zVision Dashboard.   Electronically Signed   By: Suzy Bouchard M.D.   On: 06/27/2014 20:07     Imaging Review Dg Abd Acute W/chest  07/02/2014   CLINICAL DATA:  Nausea, lightheadedness, worsening with ambulation. Symptoms for 1 day. Diaphoresis.   EXAM: ACUTE ABDOMEN SERIES (ABDOMEN 2 VIEW & CHEST 1 VIEW)  COMPARISON:  06/27/2014  FINDINGS: Heart is borderline in size. Bibasilar interstitial opacities are stable since prior study. This could reflect atelectasis, a is scratch head atypical infection  or chronic interstitial lung disease. No effusions.  Nonobstructive bowel gas pattern. No free air. No organomegaly or suspicious calcification. No acute bony abnormality.  IMPRESSION: No evidence of bowel obstruction or free air.  Stable interstitial opacities in the lung bases which could represent atelectasis, atypical infection or chronic interstitial lung disease.  Mild cardiomegaly.   Electronically Signed   By: Rolm Baptise M.D.   On: 07/02/2014 20:26     EKG Interpretation   Date/Time:  Wednesday July 02 2014 23:42:35 EST Ventricular Rate:  65 PR Interval:  141 QRS Duration: 81 QT Interval:  468 QTC Calculation: 487 R Axis:   57 Text Interpretation:  Sinus rhythm Borderline prolonged QT interval  Otherwise within normal limits No old tracing to compare Confirmed by  [facility]  MD, Rochell Puett ([id number]) on 07/02/2014 11:58:48 PM      MDM   Final diagnoses:  None    Dizziness and nausea of uncertain cause. Possible labyrinthine disorder. Dizziness sounds orthostatic and orthostatic vital signs will be obtained. She'll be given therapeutic trial of meclizine. Of note, she had received a dose of ondansetron at urgent care and this did give some relief of her nausea.  Orthostatic vital signs do not show any significant change, but there is a moderate drop of blood pressure which does not reach the significant level. She states that there was only minimal dizziness with standing up. She has refused meclizine, but if she is feeling considerably better. She states that she is scheduled for an MRI of her shoulder and wonders if it can be done in the ED. I reviewed those records and she is being evaluated for lytic lesion in the proximal left  humerus. MRI has been ordered with and without contrast. There was extreme difficulty in getting an IV established for the patient. She is currently getting IV fluids. It seems reasonable to try to get MRI done while IV is already established. She'll be kept in the ED in order to obtain the MRI. Case is signed out to Dr. Randal Buba.  Delora Fuel, MD 51/70/01 [id number]

## 2014-07-02 NOTE — ED Notes (Signed)
IV team at bedside 

## 2014-07-03 ENCOUNTER — Telehealth: Payer: Self-pay | Admitting: Hematology & Oncology

## 2014-07-03 ENCOUNTER — Encounter: Payer: Self-pay | Admitting: Emergency Medicine

## 2014-07-03 ENCOUNTER — Ambulatory Visit (INDEPENDENT_AMBULATORY_CARE_PROVIDER_SITE_OTHER): Payer: BC Managed Care – PPO | Admitting: Emergency Medicine

## 2014-07-03 ENCOUNTER — Emergency Department (HOSPITAL_COMMUNITY): Payer: BC Managed Care – PPO

## 2014-07-03 ENCOUNTER — Other Ambulatory Visit: Payer: BC Managed Care – PPO

## 2014-07-03 VITALS — BP 114/76 | HR 80 | Temp 97.9°F | Resp 16 | Ht 65.0 in | Wt 141.0 lb

## 2014-07-03 DIAGNOSIS — M7592 Shoulder lesion, unspecified, left shoulder: Secondary | ICD-10-CM | POA: Insufficient documentation

## 2014-07-03 DIAGNOSIS — D649 Anemia, unspecified: Secondary | ICD-10-CM | POA: Insufficient documentation

## 2014-07-03 DIAGNOSIS — M349 Systemic sclerosis, unspecified: Secondary | ICD-10-CM

## 2014-07-03 DIAGNOSIS — R799 Abnormal finding of blood chemistry, unspecified: Secondary | ICD-10-CM | POA: Insufficient documentation

## 2014-07-03 LAB — URINALYSIS, ROUTINE W REFLEX MICROSCOPIC
BILIRUBIN URINE: NEGATIVE
GLUCOSE, UA: NEGATIVE mg/dL
HGB URINE DIPSTICK: NEGATIVE
KETONES UR: NEGATIVE mg/dL
Leukocytes, UA: NEGATIVE
Nitrite: NEGATIVE
PH: 6 (ref 5.0–8.0)
PROTEIN: 30 mg/dL — AB
Specific Gravity, Urine: 1.013 (ref 1.005–1.030)
Urobilinogen, UA: 0.2 mg/dL (ref 0.0–1.0)

## 2014-07-03 LAB — URINE MICROSCOPIC-ADD ON

## 2014-07-03 MED ORDER — GADOBENATE DIMEGLUMINE 529 MG/ML IV SOLN
10.0000 mL | Freq: Once | INTRAVENOUS | Status: AC | PRN
  Administered 2014-07-03: 10 mL via INTRAVENOUS

## 2014-07-03 NOTE — ED Provider Notes (Signed)
Results for orders placed or performed during the hospital encounter of 07/02/14  CBC with Differential  Result Value Ref Range   WBC 11.4 (H) 4.0 - 10.5 K/uL   RBC 3.55 (L) 3.87 - 5.11 MIL/uL   Hemoglobin 10.9 (L) 12.0 - 15.0 g/dL   HCT 34.4 (L) 36.0 - 46.0 %   MCV 96.9 78.0 - 100.0 fL   MCH 30.7 26.0 - 34.0 pg   MCHC 31.7 30.0 - 36.0 g/dL   RDW 12.9 11.5 - 15.5 %   Platelets 282 150 - 400 K/uL   Neutrophils Relative % 68 43 - 77 %   Neutro Abs 7.8 (H) 1.7 - 7.7 K/uL   Lymphocytes Relative 24 12 - 46 %   Lymphs Abs 2.8 0.7 - 4.0 K/uL   Monocytes Relative 7 3 - 12 %   Monocytes Absolute 0.7 0.1 - 1.0 K/uL   Eosinophils Relative 1 0 - 5 %   Eosinophils Absolute 0.1 0.0 - 0.7 K/uL   Basophils Relative 0 0 - 1 %   Basophils Absolute 0.0 0.0 - 0.1 K/uL  Comprehensive metabolic panel  Result Value Ref Range   Sodium 140 137 - 147 mEq/L   Potassium 4.2 3.7 - 5.3 mEq/L   Chloride 97 96 - 112 mEq/L   CO2 27 19 - 32 mEq/L   Glucose, Bld 84 70 - 99 mg/dL   BUN 10 6 - 23 mg/dL   Creatinine, Ser 0.61 0.50 - 1.10 mg/dL   Calcium 9.9 8.4 - 10.5 mg/dL   Total Protein 9.6 (H) 6.0 - 8.3 g/dL   Albumin 2.8 (L) 3.5 - 5.2 g/dL   AST 21 0 - 37 U/L   ALT 11 0 - 35 U/L   Alkaline Phosphatase 85 39 - 117 U/L   Total Bilirubin 0.3 0.3 - 1.2 mg/dL   GFR calc non Af Amer >90 >90 mL/min   GFR calc Af Amer >90 >90 mL/min   Anion gap 16 (H) 5 - 15  Urinalysis, Routine w reflex microscopic  Result Value Ref Range   Color, Urine YELLOW YELLOW   APPearance CLOUDY (A) CLEAR   Specific Gravity, Urine 1.013 1.005 - 1.030   pH 6.0 5.0 - 8.0   Glucose, UA NEGATIVE NEGATIVE mg/dL   Hgb urine dipstick NEGATIVE NEGATIVE   Bilirubin Urine NEGATIVE NEGATIVE   Ketones, ur NEGATIVE NEGATIVE mg/dL   Protein, ur 30 (A) NEGATIVE mg/dL   Urobilinogen, UA 0.2 0.0 - 1.0 mg/dL   Nitrite NEGATIVE NEGATIVE   Leukocytes, UA NEGATIVE NEGATIVE  Troponin I  Result Value Ref Range   Troponin I <0.30 <0.30 ng/mL   Urine microscopic-add on  Result Value Ref Range   Squamous Epithelial / LPF FEW (A) RARE   WBC, UA 3-6 <3 WBC/hpf   Bacteria, UA RARE RARE   Dg Chest 2 View  06/28/2014   CLINICAL DATA:  Metastatic lesion involving the left humerus.  EXAM: CHEST  2 VIEW  COMPARISON:  Left humerus and shoulder radiographs - earlier same day  FINDINGS: Borderline enlarged cardiac silhouette and mediastinal contours with atherosclerotic plaque within the thoracic aorta. Evaluation of the retrosternal clear space is obscured secondary to overlying soft tissues. Bibasilar heterogeneous interstitial opacities. No discrete focal airspace opacities or discrete pulmonary mass. No pleural effusion or pneumothorax. Known lytic lesion involving the medial aspect of the proximal humeral metaphysis is incompletely visualized. Regional soft tissues appear normal.  IMPRESSION: 1. Bibasilar interstitial opacities, nonspecific and while possibly  represent atelectasis, atypical infection could have a similar appearance. 2. No discrete pulmonary mass. 3. Cardiomegaly without evidence of edema. 4. Known indeterminate lytic lesion involving the proximal humeral metaphysis is incompletely evaluated on this examination, though again worrisome for metastatic disease. Again, further evaluation with CT of the chest, abdomen and pelvis is recommended to evaluate for a primary malignancy.   Electronically Signed   By: Sandi Mariscal M.D.   On: 06/28/2014 08:02   Mr Shoulder Left W Wo Contrast  07/03/2014   CLINICAL DATA:  Lytic lesion in the left proximal humerus.  EXAM: MRI OF THE LEFT SHOULDER WITHOUT AND WITH CONTRAST  TECHNIQUE: Multiplanar, multisequence MR imaging was performed both before and after administration of intravenous contrast.  CONTRAST:  31m MULTIHANCE GADOBENATE DIMEGLUMINE 529 MG/ML IV SOLN  COMPARISON:  None.  FINDINGS: There is a well-circumscribed, expansile mass in the surgical neck of the left proximal humerus measuring 3.9  x 3.8 x 3.8 cm. There is surrounding marrow edema. There is cortical destruction of the anterior medial cortex. There is an extraosseous soft tissue component along the inferior margin of the mass along the medial aspect of the proximal humeral diaphysis measuring 2.4 x 0.8 cm.  There is a second lesion measuring 12.5 mm in the proximal left humeral diaphysis with minimal endosteal scalloping. There are 2 other smaller T1 hypointense lesions measuring less than 1 cm.  There is left axillary lymphadenopathy with the largest lymph node measuring 11 mm in short axis. There is a enlarged subpectoral lymph node measuring 10 mm in short axis.  There is mild insertional tendinosis of the supraspinatus tendon and infraspinatus tendon without a discrete tear. The subscapularis tendon is intact. The teres minor tendon is intact. The labrum is grossly intact, but evaluation is limited by lack of intraarticular fluid. There is no joint effusion. There is no chondral defect. There is no dislocation. There are mild degenerative changes of the acromioclavicular joint. There is a Type II acromion. There is no subacromial/subdeltoid bursal fluid. The long head of the biceps tendon is intact. The muscles are normal in signal.  IMPRESSION: 1. Well-circumscribed expansile mass in the surgical neck of the left proximal humerus as described above. Smaller lesions in the proximal humeral diaphysis. Left axillary lymphadenopathy is present. Differential diagnosis includes multiple myeloma versus metastatic disease. Correlation with mammography is recommended as well as serum and urine protein electrophoresis.   Electronically Signed   By: HKathreen Devoid  On: 07/03/2014 08:35   Dg Shoulder Left  06/27/2014   CLINICAL DATA:  Shoulder pain, no trauma.  History of scleroderma.  EXAM: LEFT SHOULDER - 2+ VIEW  COMPARISON:  None.  FINDINGS: There is lytic destruction of the medial aspect of the left humeral neck. No displaced fracture is  identified. The humeral head is properly located. No soft tissue abnormality.  IMPRESSION: Lytic lesion involving the medial aspect of the left proximal humerus. Differential considerations include metastasis, myeloma, or less likely osteomyelitis. Consider further evaluation with bone scan and/or correlation with lab exams to determine further appropriate study such as PET-CT.   Electronically Signed   By: GConchita ParisM.D.   On: 06/27/2014 20:26   Dg Abd Acute W/chest  07/02/2014   CLINICAL DATA:  Nausea, lightheadedness, worsening with ambulation. Symptoms for 1 day. Diaphoresis.  EXAM: ACUTE ABDOMEN SERIES (ABDOMEN 2 VIEW & CHEST 1 VIEW)  COMPARISON:  06/27/2014  FINDINGS: Heart is borderline in size. Bibasilar interstitial opacities are stable since prior  study. This could reflect atelectasis, a is scratch head atypical infection or chronic interstitial lung disease. No effusions.  Nonobstructive bowel gas pattern. No free air. No organomegaly or suspicious calcification. No acute bony abnormality.  IMPRESSION: No evidence of bowel obstruction or free air.  Stable interstitial opacities in the lung bases which could represent atelectasis, atypical infection or chronic interstitial lung disease.  Mild cardiomegaly.   Electronically Signed   By: Rolm Baptise M.D.   On: 07/02/2014 20:26   Dg Humerus Left  06/27/2014   CLINICAL DATA:  Shoulder pain.  EXAM: LEFT HUMERUS - 2+ VIEW  COMPARISON:  None.  FINDINGS: There is cortical erosion involving the proximal humerus extending from the metaphysis into the radial humeral head along the medial border. There is an irregular margin to this lytic process. No evidence of acute fracture dislocation.  IMPRESSION: Aggressive lytic lesion in the proximal femur is consistent with a metastatic lesion, primary bone tumor or less likely aggressive infection. Consider chest CT with contrast to evaluate for primary neoplasm.  These results will be called to the ordering  clinician or representative by the Radiologist Assistant, and communication documented in the PACS or zVision Dashboard.   Electronically Signed   By: Suzy Bouchard M.D.   On: 06/27/2014 20:07    MRI results of given to the patient for follow-up with her primary care doctor. MRI was done because patient was a difficult IV stick MRI was to be done as an outpatient but since they've been she had an IV in her they decided to go forward with the MRI patient stayed in the department overnight awaiting MRI. MRI results didn't have anything directly to do with the patient's of treatment here or care plan here.  Fredia Sorrow, MD 07/03/14 [phone]

## 2014-07-03 NOTE — Discharge Instructions (Signed)
Follow-up with your doctors at the urgent care. Will most likely require evaluation by hematology oncology. Continue your current meds. Return as needed.

## 2014-07-03 NOTE — Telephone Encounter (Signed)
Pt is in the office signing in to be seen.

## 2014-07-03 NOTE — Telephone Encounter (Signed)
Called and spoke with patient.  She had a mammogram about a year ago in [place] which was normal and she has done a SBE monthly since then and has felt nothing abnl.  I will do a referral for screening mammogram so patient can be set up here in [place].

## 2014-07-03 NOTE — Telephone Encounter (Signed)
FYI

## 2014-07-03 NOTE — Progress Notes (Signed)
   Subjective:    Patient ID: Samantha Moyer, female    DOB: 10/03/1965, 48 y.o.   MRN: [id number]  HPIpatient here to follow-up recent visit to the emergency room. She has a lytic lesion in the proximal humerus. MRI was done and showed a large lytic lesion in the proximal humerus associated with secondary lesions and axillary adenopathy. Patient wants to review the findings on her MRI. She was seen last night in the emergency room and evaluated for dizziness and the symptoms were felt to be secondary to medication and are resolved today. Of note she has had a previous CT of the head.   Review of Systems     Objective:   Physical Exam No examination performed today       Assessment & Plan:  I discussed the results of her MRI with the patient and a member of her Bible study class. I spoke with Dr. Marin Olp. He is going to contact the patient this afternoon and make arrangements to see the patient with consultation to be obtained with Dr. Dr. Latanya Maudlin.patient agrees with treatment plan.

## 2014-07-03 NOTE — Telephone Encounter (Signed)
Dr. Everlene Farrier,   Patient is at [facility] - has the results of MRI - she is coming in to see you after ten this morning.    Several Holes in her bone.    [phone] (H)

## 2014-07-03 NOTE — ED Notes (Signed)
MD at bedside. 

## 2014-07-03 NOTE — Telephone Encounter (Signed)
Left pt message to call for appointment °

## 2014-07-03 NOTE — Telephone Encounter (Signed)
I spoke w NEW PATIENT today to remind them of their appointment with Dr. Ennever. Also, advised them to bring all medication bottles and insurance card information. ° °

## 2014-07-03 NOTE — ED Notes (Addendum)
Pt returned from MRI °

## 2014-07-04 ENCOUNTER — Ambulatory Visit (HOSPITAL_BASED_OUTPATIENT_CLINIC_OR_DEPARTMENT_OTHER)
Admission: RE | Admit: 2014-07-04 | Discharge: 2014-07-04 | Disposition: A | Discharge status: Home / Self Care | Payer: BC Managed Care – PPO | Source: Ambulatory Visit | Attending: Hematology & Oncology | Admitting: Hematology & Oncology

## 2014-07-04 ENCOUNTER — Encounter: Payer: Self-pay | Admitting: Family

## 2014-07-04 ENCOUNTER — Ambulatory Visit (HOSPITAL_BASED_OUTPATIENT_CLINIC_OR_DEPARTMENT_OTHER): Payer: BC Managed Care – PPO | Admitting: Lab

## 2014-07-04 ENCOUNTER — Ambulatory Visit: Payer: BC Managed Care – PPO

## 2014-07-04 ENCOUNTER — Ambulatory Visit (HOSPITAL_BASED_OUTPATIENT_CLINIC_OR_DEPARTMENT_OTHER): Payer: BC Managed Care – PPO | Admitting: Family

## 2014-07-04 VITALS — BP 119/79 | HR 81 | Temp 97.8°F | Resp 14 | Ht 65.0 in | Wt 141.0 lb

## 2014-07-04 DIAGNOSIS — R799 Abnormal finding of blood chemistry, unspecified: Secondary | ICD-10-CM

## 2014-07-04 DIAGNOSIS — M5136 Other intervertebral disc degeneration, lumbar region: Secondary | ICD-10-CM | POA: Diagnosis not present

## 2014-07-04 DIAGNOSIS — M7592 Shoulder lesion, unspecified, left shoulder: Secondary | ICD-10-CM

## 2014-07-04 DIAGNOSIS — C9000 Multiple myeloma not having achieved remission: Secondary | ICD-10-CM | POA: Insufficient documentation

## 2014-07-04 DIAGNOSIS — M899 Disorder of bone, unspecified: Secondary | ICD-10-CM

## 2014-07-04 DIAGNOSIS — M25512 Pain in left shoulder: Secondary | ICD-10-CM

## 2014-07-04 LAB — CMP (CANCER CENTER ONLY)
ALT(SGPT): 15 U/L (ref 10–47)
AST: 25 U/L (ref 11–38)
Albumin: 2.9 g/dL — ABNORMAL LOW (ref 3.3–5.5)
Alkaline Phosphatase: 59 U/L (ref 26–84)
BILIRUBIN TOTAL: 0.5 mg/dL (ref 0.20–1.60)
BUN, Bld: 13 mg/dL (ref 7–22)
CALCIUM: 10 mg/dL (ref 8.0–10.3)
CO2: 27 mEq/L (ref 18–33)
CREATININE: 0.7 mg/dL (ref 0.6–1.2)
Chloride: 97 mEq/L — ABNORMAL LOW (ref 98–108)
Glucose, Bld: 86 mg/dL (ref 73–118)
Potassium: 3.9 mEq/L (ref 3.3–4.7)
Sodium: 143 mEq/L (ref 128–145)
Total Protein: 9.8 g/dL — ABNORMAL HIGH (ref 6.4–8.1)

## 2014-07-04 LAB — CBC WITH DIFFERENTIAL (CANCER CENTER ONLY)
BASO#: 0 10*3/uL (ref 0.0–0.2)
BASO%: 0.2 % (ref 0.0–2.0)
EOS%: 1.3 % (ref 0.0–7.0)
Eosinophils Absolute: 0.1 10*3/uL (ref 0.0–0.5)
HCT: 35.7 % (ref 34.8–46.6)
HEMOGLOBIN: 11.2 g/dL — AB (ref 11.6–15.9)
LYMPH#: 1.8 10*3/uL (ref 0.9–3.3)
LYMPH%: 20.6 % (ref 14.0–48.0)
MCH: 30.8 pg (ref 26.0–34.0)
MCHC: 31.4 g/dL — ABNORMAL LOW (ref 32.0–36.0)
MCV: 98 fL (ref 81–101)
MONO#: 0.6 10*3/uL (ref 0.1–0.9)
MONO%: 6.2 % (ref 0.0–13.0)
NEUT#: 6.4 10*3/uL (ref 1.5–6.5)
NEUT%: 71.7 % (ref 39.6–80.0)
Platelets: 270 10*3/uL (ref 145–400)
RBC: 3.64 10*6/uL — ABNORMAL LOW (ref 3.70–5.32)
RDW: 12.7 % (ref 11.1–15.7)
WBC: 8.9 10*3/uL (ref 3.9–10.0)

## 2014-07-04 LAB — CHCC SATELLITE - SMEAR

## 2014-07-04 NOTE — Progress Notes (Signed)
Hematology/Oncology Consultation   Name: Samantha Moyer      MRN: [id number]    Location: Room/bed info not found  Date: 07/04/2014 Time:1:38 PM   REFERRING PHYSICIAN:  Remo Lipps A Daub  REASON FOR CONSULT:  MRI revealed large lytic lesion in the proximal humerus associated with secondary lesions and axillary adenopathy.    DIAGNOSIS: Probable Myeloma  HISTORY OF PRESENT ILLNESS:  Samantha Moyer is a very pleasant 48 yo African American female. She began having pain in her left shoulder and arm the last week of October. She went to the urgent care and was given naproxen and flexeril. These did not help her. Sh returned to the urgent care on 11/6 and xray revealed what appeared to be "tears" in the humerus. On 11/11 she became sick with nausea and vomiting after taking tramedol for pain. On 11/12 an MRI showed large lytic lesion in the proximal humerus associated with secondary lesions and axillary adenopathy.  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 She has skin tightening which makes the sensitivity to cold much worse. She also has feels that there is a lump in her throat sometimes when she swallows.  She has also been having headaches she describes as "brain freezes." They last 10-15 seconds and are worse when cold air hits her head. She has been wearing a hat.  Her sister had a benign brain tumor. Maternal grandmother had a brain tumor. She is unsure if it was malignant or benign. Her brother passed away from metastatic cancer but she is unsure of the primary source.   She is a Patent attorney. She is originally from [place], [place] but has been living in [facility] for the last year. She does not smoke or drink.  Her only surgeries are for a deviated septum and a laser removal of fibroids from her uterus. She denies fever, chills, n/v, cough, rash, dizziness, SOB,  chest pain, palpitations, abdominal pain, diarrhea, blood in urine or stool. She has had a little constipation with her pain medication.  No swelling, tenderness, numbness or tingling in her extremities.  Her appetite is ok and she drinks plenty of water. Her weight is stable.    ROS: All other 10 point review of systems is negative.   PAST MEDICAL HISTORY:   Past Medical History  Diagnosis Date  . Scleroderma     ALLERGIES: Allergies  Allergen Reactions  . Codeine Nausea Only  . Tramadol Nausea And Vomiting and Other (See Comments)      MEDICATIONS:  Current Outpatient Prescriptions on File Prior to Visit  Medication Sig Dispense Refill  . Multiple Vitamins-Minerals (ALIVE WOMENS ENERGY) TABS Take 1 tablet by mouth daily.    . naproxen (NAPROSYN) 500 MG tablet Take 500 mg by mouth 2 (two) times daily as needed for moderate pain.    . traMADol (ULTRAM) 50 MG tablet Take 1 tablet (50 mg total) by mouth every 8 (eight) hours as needed. 30 tablet 0  . HYDROcodone-acetaminophen (NORCO/VICODIN) 5-325 MG per tablet Take 1 tablet by mouth every 6 (six) hours as needed for moderate pain. 20 tablet 0   No current facility-administered medications on file prior to visit.     PAST SURGICAL HISTORY Past Surgical History  Procedure Laterality Date  . Nasal septum surgery      FAMILY HISTORY: Family History  Problem Relation Age  of Onset  . Heart disease Mother   . Diabetes Mother   . Hypertension Mother   . Hyperlipidemia Mother     SOCIAL HISTORY:  reports that she has never smoked. She has never used smokeless tobacco. She reports that she does not drink alcohol or use illicit drugs.  PERFORMANCE STATUS: The patient's performance status is 1 - Symptomatic but completely ambulatory  PHYSICAL EXAM: Most Recent Vital Signs: Blood pressure 119/79, pulse 81, temperature 97.8 F (36.6 C), temperature source Oral, resp. rate 14, height 5' 5"  (1.651 m), weight 141 lb (63.957  kg). BP 119/79 mmHg  Pulse 81  Temp(Src) 97.8 F (36.6 C) (Oral)  Resp 14  Ht 5' 5"  (1.651 m)  Wt 141 lb (63.957 kg)  BMI 23.46 kg/m2  General Appearance:    Alert, cooperative, no distress, appears stated age  Head:    Normocephalic, without obvious abnormality, atraumatic  Eyes:    PERRL, conjunctiva/corneas clear, EOM's intact, fundi    benign, both eyes        Throat:   Lips, mucosa, and tongue normal; teeth and gums normal  Neck:   Supple, symmetrical, trachea midline, no adenopathy;    thyroid:  no enlargement/tenderness/nodules; no carotid   bruit or JVD  Back:     Symmetric, no curvature, ROM normal, no CVA tenderness  Lungs:     Clear to auscultation bilaterally, respirations unlabored  Chest Wall:    No tenderness or deformity   Heart:    Regular rate and rhythm, S1 and S2 normal, no murmur, rub   or gallop     Abdomen:     Soft, non-tender, bowel sounds active all four quadrants,    no masses, no organomegaly        Extremities:   She has her left arm in a sling today, this helps with the pain, no trauma, no cyanosis or edema  Pulses:   2+ and symmetric all extremities  Skin:   Skin color, texture, turgor normal, no rashes or lesions  Lymph nodes:   Cervical, supraclavicular, and axillary nodes normal  Neurologic:   CNII-XII intact, normal strength, sensation and reflexes    throughout    LABORATORY DATA:  Results for orders placed or performed in visit on 07/04/14 (from the past 48 hour(s))  CBC with Differential [facility])     Status: Abnormal   Collection Time: 07/04/14  9:38 AM  Result Value Ref Range   WBC 8.9 3.9 - 10.0 10e3/uL   RBC 3.64 (L) 3.70 - 5.32 10e6/uL   HGB 11.2 (L) 11.6 - 15.9 g/dL   HCT 35.7 34.8 - 46.6 %   MCV 98 81 - 101 fL   MCH 30.8 26.0 - 34.0 pg   MCHC 31.4 (L) 32.0 - 36.0 g/dL   RDW 12.7 11.1 - 15.7 %   Platelets 270 145 - 400 10e3/uL   NEUT# 6.4 1.5 - 6.5 10e3/uL   LYMPH# 1.8 0.9 - 3.3 10e3/uL   MONO# 0.6 0.1 - 0.9  10e3/uL   Eosinophils Absolute 0.1 0.0 - 0.5 10e3/uL   BASO# 0.0 0.0 - 0.2 10e3/uL   NEUT% 71.7 39.6 - 80.0 %   LYMPH% 20.6 14.0 - 48.0 %   MONO% 6.2 0.0 - 13.0 %   EOS% 1.3 0.0 - 7.0 %   BASO% 0.2 0.0 - 2.0 %  [facility] - Smear     Status: None   Collection Time: 07/04/14  9:38 AM  Result Value  Ref Range   Smear Result Smear Available   COMPREHENSIVE METABOLIC PANEL ([facility] REFLEX ONLY)     Status: Abnormal   Collection Time: 07/04/14  9:38 AM  Result Value Ref Range   Sodium 143 128 - 145 mEq/L   Potassium 3.9 3.3 - 4.7 mEq/L   Chloride 97 (L) 98 - 108 mEq/L   CO2 27 18 - 33 mEq/L   Glucose, Bld 86 73 - 118 mg/dL   BUN, Bld 13 7 - 22 mg/dL   Creat 0.7 0.6 - 1.2 mg/dl   Total Bilirubin 0.50 0.20 - 1.60 mg/dl   Alkaline Phosphatase 59 26 - 84 U/L   AST 25 11 - 38 U/L   ALT(SGPT) 15 10 - 47 U/L   Total Protein 9.8 (H) 6.4 - 8.1 g/dL   Albumin 2.9 (L) 3.3 - 5.5 g/dL   Calcium 10.0 8.0 - 10.3 mg/dL      RADIOGRAPHY: Mr Shoulder Left W Wo Contrast  07/03/2014   CLINICAL DATA:  Lytic lesion in the left proximal humerus.  EXAM: MRI OF THE LEFT SHOULDER WITHOUT AND WITH CONTRAST  TECHNIQUE: Multiplanar, multisequence MR imaging was performed both before and after administration of intravenous contrast.  CONTRAST:  79m MULTIHANCE GADOBENATE DIMEGLUMINE 529 MG/ML IV SOLN  COMPARISON:  None.  FINDINGS: There is a well-circumscribed, expansile mass in the surgical neck of the left proximal humerus measuring 3.9 x 3.8 x 3.8 cm. There is surrounding marrow edema. There is cortical destruction of the anterior medial cortex. There is an extraosseous soft tissue component along the inferior margin of the mass along the medial aspect of the proximal humeral diaphysis measuring 2.4 x 0.8 cm.  There is a second lesion measuring 12.5 mm in the proximal left humeral diaphysis with minimal endosteal scalloping. There are 2 other smaller T1 hypointense lesions measuring less than 1 cm.  There is  left axillary lymphadenopathy with the largest lymph node measuring 11 mm in short axis. There is a enlarged subpectoral lymph node measuring 10 mm in short axis.  There is mild insertional tendinosis of the supraspinatus tendon and infraspinatus tendon without a discrete tear. The subscapularis tendon is intact. The teres minor tendon is intact. The labrum is grossly intact, but evaluation is limited by lack of intraarticular fluid. There is no joint effusion. There is no chondral defect. There is no dislocation. There are mild degenerative changes of the acromioclavicular joint. There is a Type II acromion. There is no subacromial/subdeltoid bursal fluid. The long head of the biceps tendon is intact. The muscles are normal in signal.  IMPRESSION: 1. Well-circumscribed expansile mass in the surgical neck of the left proximal humerus as described above. Smaller lesions in the proximal humeral diaphysis. Left axillary lymphadenopathy is present. Differential diagnosis includes multiple myeloma versus metastatic disease. Correlation with mammography is recommended as well as serum and urine protein electrophoresis.   Electronically Signed   By: HKathreen Devoid  On: 07/03/2014 08:35   Dg Abd Acute W/chest  07/02/2014   CLINICAL DATA:  Nausea, lightheadedness, worsening with ambulation. Symptoms for 1 day. Diaphoresis.  EXAM: ACUTE ABDOMEN SERIES (ABDOMEN 2 VIEW & CHEST 1 VIEW)  COMPARISON:  06/27/2014  FINDINGS: Heart is borderline in size. Bibasilar interstitial opacities are stable since prior study. This could reflect atelectasis, a is scratch head atypical infection or chronic interstitial lung disease. No effusions.  Nonobstructive bowel gas pattern. No free air. No organomegaly or suspicious calcification. No acute bony abnormality.  IMPRESSION:  No evidence of bowel obstruction or free air.  Stable interstitial opacities in the lung bases which could represent atelectasis, atypical infection or chronic  interstitial lung disease.  Mild cardiomegaly.   Electronically Signed   By: Rolm Baptise M.D.   On: 07/02/2014 20:26       PATHOLOGY: None   ASSESSMENT/PLAN:  Ms. Stubbe is a very pleasant 48 yo African American female. She began having pain in her left shoulder and arm the last week of October. On 11/12 an MRI showed large lytic lesion in the proximal humerus associated with secondary lesions and axillary adenopathy. She has her left arm in a sling today. This helps with pain.  This appears to be myeloma.  She is going to have a bone survey today and also go se Dr. Karin Golden.  Her total protein was 9.8 and albumin 2.9. We will wait and see what the rest of her lab testing shows.   She may need a bone marrow biopsy for confirmation of diagnosis.  All questions were answered. She knows to call the clinic with any problems, questions or concerns. We can certainly see her much sooner if necessary. The patient was discussed with and also seen by Dr. Marin Olp and he is in agreement with the aforementioned.   [facility]    Addendum:  I saw and examined the patient with Sarah. I have to believe that she will have myeloma.I did do a bone survey on her. She does have lytic lesions in the calvarium.  I referred her to Dr. Rhona Raider of orthopedic surgery. I think that her left arm will need to be fixed. I would then received with radiation therapy for this.  She has an elevated total protein and a decreased albumin.  Her IgA level is always 4000. I think this is highly indicative of what we are looking at.  We did give her a 24-hour urine to do.  On her exam, I really cannot find anything specific. There is no hepatosplenomegaly. There is no lymphadenopathy.  It is not surprising that she has an elevated IgA level. Typically, IgA myeloma tends to be more aggressive.  We will see what Dr. Rhona Raider has to say. Again, I would like to think that she will need surgery to fix the left  humerus.  We did give her a prayer blanket. She is very appreciative of this.  We did have an excellent prayer session.  We spent about an hour with her today.

## 2014-07-07 ENCOUNTER — Other Ambulatory Visit: Payer: BC Managed Care – PPO

## 2014-07-07 ENCOUNTER — Other Ambulatory Visit: Payer: Self-pay | Admitting: Family

## 2014-07-07 DIAGNOSIS — M7592 Shoulder lesion, unspecified, left shoulder: Secondary | ICD-10-CM

## 2014-07-07 LAB — PROTEIN ELECTROPHORESIS, SERUM
ALBUMIN ELP: 34.3 % — AB (ref 55.8–66.1)
ALPHA-1-GLOBULIN: 2.6 % — AB (ref 2.9–4.9)
Alpha-2-Globulin: 7.7 % (ref 7.1–11.8)
Beta 2: 3.9 % (ref 3.2–6.5)
Beta Globulin: 42.8 % — ABNORMAL HIGH (ref 4.7–7.2)
GAMMA GLOBULIN: 8.7 % — AB (ref 11.1–18.8)
M-Spike, %: 0.25 g/dL
Total Protein, Serum Electrophoresis: 9.9 g/dL — ABNORMAL HIGH (ref 6.0–8.3)

## 2014-07-08 LAB — LACTATE DEHYDROGENASE: LDH: 135 U/L (ref 94–250)

## 2014-07-08 LAB — PROTEIN ELECTROPHORESIS, SERUM, WITH REFLEX
ALPHA-1-GLOBULIN: 3 % (ref 2.9–4.9)
ALPHA-2-GLOBULIN: 7.9 % (ref 7.1–11.8)
Albumin ELP: 33 % — ABNORMAL LOW (ref 55.8–66.1)
BETA 2: 4.4 % (ref 3.2–6.5)
BETA GLOBULIN: 42.7 % — AB (ref 4.7–7.2)
GAMMA GLOBULIN: 9 % — AB (ref 11.1–18.8)
M-Spike, %: 0.19 g/dL
Total Protein, Serum Electrophoresis: 9.1 g/dL — ABNORMAL HIGH (ref 6.0–8.3)

## 2014-07-08 LAB — KAPPA/LAMBDA LIGHT CHAINS
KAPPA FREE LGHT CHN: 0.24 mg/dL — AB (ref 0.33–1.94)
Kappa:Lambda Ratio: 0 — ABNORMAL LOW (ref 0.26–1.65)
Lambda Free Lght Chn: 461 mg/dL — ABNORMAL HIGH (ref 0.57–2.63)

## 2014-07-08 LAB — IGG, IGA, IGM
IGA: 3890 mg/dL — AB (ref 69–380)
IGG (IMMUNOGLOBIN G), SERUM: 837 mg/dL (ref 690–1700)
IGM, SERUM: 17 mg/dL — AB (ref 52–322)

## 2014-07-08 LAB — IFE INTERPRETATION

## 2014-07-08 LAB — BETA 2 MICROGLOBULIN, SERUM: Beta-2 Microglobulin: 4.07 mg/L — ABNORMAL HIGH (ref <=2.51)

## 2014-07-09 LAB — UIFE/LIGHT CHAINS/TP QN, 24-HR UR
ALPHA 1 UR: DETECTED — AB
ALPHA 2 UR: DETECTED — AB
Albumin, U: DETECTED
BETA UR: DETECTED — AB
Gamma Globulin, Urine: DETECTED — AB
TOTAL PROTEIN, URINE-UR/DAY: 3620 mg/d — AB (ref <150)
Time: 24 hours
Total Protein, Urine: 181 mg/dL — ABNORMAL HIGH (ref 5–24)
Volume, Urine: 2000 mL

## 2014-07-09 LAB — KAPPA/LAMBDA LIGHT CHAINS, FREE, WITH RATIO, 24HR. URINE
KAPPA LIGHT CHAIN, FREE U: 9.59 mg/L (ref 1.35–24.19)
Kappa/Lambda, Free Ratio: 0 — ABNORMAL LOW (ref 2.04–10.37)
Lambda Light Chain, Free U: 4390 mg/L — ABNORMAL HIGH (ref 0.24–6.66)

## 2014-07-10 ENCOUNTER — Telehealth: Payer: Self-pay | Admitting: Hematology & Oncology

## 2014-07-10 ENCOUNTER — Other Ambulatory Visit: Payer: Self-pay

## 2014-07-10 NOTE — Telephone Encounter (Signed)
Faxed medical records today to:  [facility]  P: [phone] [phone] F: [phone]       COPY SCANNED

## 2014-07-16 ENCOUNTER — Ambulatory Visit: Payer: BC Managed Care – PPO

## 2014-07-21 ENCOUNTER — Ambulatory Visit (HOSPITAL_BASED_OUTPATIENT_CLINIC_OR_DEPARTMENT_OTHER): Payer: BC Managed Care – PPO | Admitting: Hematology & Oncology

## 2014-07-21 ENCOUNTER — Telehealth: Payer: Self-pay | Admitting: Hematology & Oncology

## 2014-07-21 ENCOUNTER — Encounter: Payer: Self-pay | Admitting: Hematology & Oncology

## 2014-07-21 ENCOUNTER — Other Ambulatory Visit (HOSPITAL_BASED_OUTPATIENT_CLINIC_OR_DEPARTMENT_OTHER): Payer: BC Managed Care – PPO | Admitting: Lab

## 2014-07-21 VITALS — BP 100/58 | HR 85 | Temp 97.3°F | Resp 14 | Ht 65.0 in | Wt 138.0 lb

## 2014-07-21 DIAGNOSIS — C9000 Multiple myeloma not having achieved remission: Secondary | ICD-10-CM

## 2014-07-21 DIAGNOSIS — M7592 Shoulder lesion, unspecified, left shoulder: Secondary | ICD-10-CM

## 2014-07-21 HISTORY — DX: Multiple myeloma not having achieved remission: C90.00

## 2014-07-21 LAB — CBC WITH DIFFERENTIAL (CANCER CENTER ONLY)
BASO#: 0 10*3/uL (ref 0.0–0.2)
BASO%: 0.1 % (ref 0.0–2.0)
EOS%: 1.6 % (ref 0.0–7.0)
Eosinophils Absolute: 0.1 10*3/uL (ref 0.0–0.5)
HCT: 32.5 % — ABNORMAL LOW (ref 34.8–46.6)
HGB: 9.7 g/dL — ABNORMAL LOW (ref 11.6–15.9)
LYMPH#: 2.1 10*3/uL (ref 0.9–3.3)
LYMPH%: 24.1 % (ref 14.0–48.0)
MCH: 29.7 pg (ref 26.0–34.0)
MCHC: 29.8 g/dL — AB (ref 32.0–36.0)
MCV: 99 fL (ref 81–101)
MONO#: 1 10*3/uL — ABNORMAL HIGH (ref 0.1–0.9)
MONO%: 11.7 % (ref 0.0–13.0)
NEUT#: 5.4 10*3/uL (ref 1.5–6.5)
NEUT%: 62.5 % (ref 39.6–80.0)
Platelets: 408 10*3/uL — ABNORMAL HIGH (ref 145–400)
RBC: 3.27 10*6/uL — ABNORMAL LOW (ref 3.70–5.32)
RDW: 12.6 % (ref 11.1–15.7)
WBC: 8.6 10*3/uL (ref 3.9–10.0)

## 2014-07-21 LAB — CMP (CANCER CENTER ONLY)
ALBUMIN: 2.5 g/dL — AB (ref 3.3–5.5)
ALK PHOS: 64 U/L (ref 26–84)
ALT(SGPT): 18 U/L (ref 10–47)
AST: 23 U/L (ref 11–38)
BILIRUBIN TOTAL: 0.4 mg/dL (ref 0.20–1.60)
BUN: 8 mg/dL (ref 7–22)
CO2: 31 mEq/L (ref 18–33)
CREATININE: 0.7 mg/dL (ref 0.6–1.2)
Calcium: 10 mg/dL (ref 8.0–10.3)
Chloride: 99 mEq/L (ref 98–108)
GLUCOSE: 96 mg/dL (ref 73–118)
POTASSIUM: 5.2 meq/L — AB (ref 3.3–4.7)
Sodium: 146 mEq/L — ABNORMAL HIGH (ref 128–145)
Total Protein: 10.2 g/dL — ABNORMAL HIGH (ref 6.4–8.1)

## 2014-07-21 LAB — CHCC SATELLITE - SMEAR

## 2014-07-21 LAB — LACTATE DEHYDROGENASE: LDH: 99 U/L (ref 94–250)

## 2014-07-21 MED ORDER — FAMCICLOVIR 500 MG PO TABS: ORAL_TABLET | ORAL | Status: DC

## 2014-07-21 MED ORDER — DEXAMETHASONE 4 MG PO TABS: ORAL_TABLET | ORAL | Status: DC

## 2014-07-21 MED ORDER — LENALIDOMIDE 25 MG PO CAPS: ORAL_CAPSULE | ORAL | Status: DC

## 2014-07-21 NOTE — Telephone Encounter (Signed)
Samantha Moyer in scheduling will put ct under review. Pt aware they will call to schedule

## 2014-07-21 NOTE — Progress Notes (Signed)
Hematology and Oncology Follow Up Visit  Samantha Moyer [id number] 07/04/1966 48 y.o. 07/21/2014   Principle Diagnosis:   IgA lambda myeloma  Current Therapy:    Status post surgical fixation of pathologic left humerus fracture  Patient to start radiation therapy to the left humerus.  Patient to start Velcade/Revlimid/Decadron/Zometa next week     Interim History:  Ms.  Samantha Moyer is back for follow-up. This is her second office visit. When we first saw her, she had a pathologic fracture of the left humerus. We sent her to Dr. Dalldorf. He went ahead and fixed the fracture. He per arrived in her left humerus.  We went ahead and did staging studies on her. She had a bone survey done area and she had numerous lytic lesions. No obvious fractures were noted outside that with the left humerus.  Blood studies showed an SPEP with 5 monoclonal spikes. I think she had a total of approximately 4 g/L of monoclonal protein. She had an IgA spike of 3890 milligrams per deciliter. Her serum lambda light chain was 461 mg/dL.  A 24-hour urine showed 3620 milligrams per day of protein. The vast majority of this was lambda light chain.  Her LDH is only 99. Her beta-2 microglobulin is 4.08. Her albumin is 2.5.  Is clear that she has very aggressive myeloma. We need to do a bone marrow test on her. I need to see what her cytogenetics are. I suspect that she will have abnormal cytogenetics.  Her appetite is down a little bit. I told her how important was to eat. She really needs to get in good protein. I told her that the protein that she eats is different than the myeloma protein and that which she takes in will not affect her myeloma.  She recovered well from her surgery. She still not moving her left arm all that much. I told her how important it was to move the left arm. I don't want to see her shoulder become "frozen".  Overall, her performance status is ECOG 2.  I told her that she really is not  able to work for another month or so. We can fill out disability forms for her.            Medications: Current outpatient prescriptions: HYDROcodone-acetaminophen (NORCO/VICODIN) 5-325 MG per tablet, Take 1 tablet by mouth every 6 (six) hours as needed for moderate pain., Disp: 20 tablet, Rfl: 0;  Multiple Vitamins-Minerals (ALIVE WOMENS ENERGY) TABS, Take 1 tablet by mouth daily., Disp: , Rfl: ;  dexamethasone (DECADRON) 4 MG tablet, Take 5 pills once a week with food, Disp: 80 tablet, Rfl: 3 famciclovir (FAMVIR) 500 MG tablet, Take 1 pill a day, Disp: 30 tablet, Rfl: 6;  Ibuprofen 200 MG CAPS, Take by mouth as needed., Disp: , Rfl: ;  lenalidomide (REVLIMID) 25 MG capsule, Take 1 pill at nighttime for 21 days on and 7 days off., Disp: 21 capsule, Rfl: 6  Allergies:  Allergies  Allergen Reactions  . Codeine Nausea Only  . Tramadol Nausea And Vomiting and Other (See Comments)    Past Medical History, Surgical history, Social history, and Family History were reviewed and updated.  Review of Systems: As above  Physical Exam:  height is 5' 5" (1.651 m) and weight is 138 lb (62.596 kg). Her oral temperature is 97.3 F (36.3 C). Her blood pressure is 100/58 and her pulse is 85. Her respiration is 14.   Thin African-American female. Head and neck exam shows no   ocular or oral lesions. She has no palpable cervical or supraclavicular lymph nodes. Lungs are clear. Cardiac exam regular rate and rhythm with no murmurs, rubs or bruits. Abdomen is soft. She has good bowel sounds in there is no fluid wave. There is no palpable liver or spleen tip. Extremities shows the healing left humeral repair scar. There is no swelling in her legs. She has good range of motion of her joints. She has good strength in her extremities. Skin exam shows no rashes, ecchymoses or petechia. Neurological exam shows no focal neurological deficits.  Lab Results  Component Value Date   WBC 8.6 07/21/2014   HGB 9.7*  07/21/2014   HCT 32.5* 07/21/2014   MCV 99 07/21/2014   PLT 408* 07/21/2014     Chemistry      Component Value Date/Time   NA 146* 07/21/2014 0950   NA 140 07/02/2014 2249   K 5.2* 07/21/2014 0950   K 4.2 07/02/2014 2249   CL 99 07/21/2014 0950   CL 97 07/02/2014 2249   CO2 31 07/21/2014 0950   CO2 27 07/02/2014 2249   BUN 8 07/21/2014 0950   BUN 10 07/02/2014 2249   CREATININE 0.7 07/21/2014 0950   CREATININE 0.61 07/02/2014 2249      Component Value Date/Time   CALCIUM 10.0 07/21/2014 0950   CALCIUM 9.9 07/02/2014 2249   ALKPHOS 64 07/21/2014 0950   ALKPHOS 85 07/02/2014 2249   AST 23 07/21/2014 0950   AST 21 07/02/2014 2249   ALT 18 07/21/2014 0950   ALT 11 07/02/2014 2249   BILITOT 0.40 07/21/2014 0950   BILITOT 0.3 07/02/2014 2249         Impression and Plan: Ms. Encalade is 48 year old African American female with IgA lambda myeloma. She presented with a pathologic fracture of the left upper arm.  I think the last's we have so far are highly indicative of aggressiveness of her disease.  Of note, her beta-2 microglobulin is high and her albumin is low. These are negative prognostic factors.  I spent about an hour with she and her friend. Her friend had a little tape recorder that she uses. Pap I went over her labs. I spleen or her that we really have to start her on systemic therapy. She will need to have a bone marrow biopsy done. I need to see what the cytogenetics are. I cannot have to suspect that they will be abnormal.  I think that she would do well with the Velcade/Revlimid/Decadron protocol. I think she has a very good chance of responding to this. We will also do monthly Zometa. The Velcade will be weekly for 3 weeks on and 1 week off. The Revlimid will be daily for 21 days on and 7 days off.  I spent most of the time told her about the complications. I gave her a prescription for Famvir. I also told her to take a full dose aspirin to help with  thromboembolic disease from the Revlimid. I told her to take the aspirin with food.  I explained the side effects. I told her to take the Revlimid at night as this can make her feel tired. I explained her about the blood counts possibly going down. I splinted her about the risk of shingles. I spleen or her about the possibility of a rash, nausea/vomiting. She understands all this.  I spoke with Dr. Sondra Come of radiation oncology. He will get her in this week to start radiation therapy. I think we  can do systemic therapy along with radiation therapy at the same time.  Again, I told her that ultimately, she will need a bone marrow biopsy. This probably would not be for another 4-6 months.  I answered all their questions.  She'll see Sarah in a couple weeks. I want to see her back the first day of her second cycle of treatment.   ENNEVER,PETER R, MD 11/30/20156:11 PM 

## 2014-07-22 ENCOUNTER — Other Ambulatory Visit: Payer: Self-pay | Admitting: *Deleted

## 2014-07-22 DIAGNOSIS — C9000 Multiple myeloma not having achieved remission: Secondary | ICD-10-CM

## 2014-07-23 ENCOUNTER — Telehealth: Payer: Self-pay | Admitting: Hematology & Oncology

## 2014-07-23 ENCOUNTER — Encounter: Payer: Self-pay | Admitting: *Deleted

## 2014-07-23 NOTE — Telephone Encounter (Signed)
Pt's FMLA papers completed and faxed today to:  [employer]: [phone] Ms. Roselie Awkward: [phone]  Pt will pick up hard copy on next appt 12/9

## 2014-07-23 NOTE — Progress Notes (Signed)
Received notice from [employer] that patient was enrolled into the Co-Pay program and her Revlimid will be available to her for a $25 copay.

## 2014-07-23 NOTE — Telephone Encounter (Signed)
S/w pt confirming new D/T for chemo edu class..... Samantha Moyer

## 2014-07-23 NOTE — Telephone Encounter (Signed)
[employer] has APPROVED the REVLIMID CAP and is good until 07/22/2017.       COPY SCANNED

## 2014-07-23 NOTE — Progress Notes (Addendum)
Histology and Location of Primary Cancer: IgA lambda myeloma  Sites of Visceral and Bony Metastatic Disease: left humerus  Location(s) of Symptomatic Metastases: left humerus  Past/Anticipated chemotherapy by medical oncology, if any: Per Dr. Marin Olp - patient to start Velcade/Revlimid/Decadron/Zometa next week.   Pain on a scale of 0-10 is: 7 in her left upper arm.  She has occasional pain in her hand.  She is taking dilaudid 2 mg q 4 hours.   Ambulatory status? Walker? Wheelchair?: ambulatory  SAFETY ISSUES:  Prior radiation? no  Pacemaker/ICD? no  Possible current pregnancy? no  Is the patient on methotrexate? no  Current Complaints / other details:  Had her left humerus pinned on 07/10/14 by Dr. Rhona Raider at [facility].  Patient is here with her friend.

## 2014-07-24 ENCOUNTER — Telehealth: Payer: Self-pay | Admitting: Hematology & Oncology

## 2014-07-24 ENCOUNTER — Ambulatory Visit
Admission: RE | Admit: 2014-07-24 | Discharge: 2014-07-24 | Disposition: A | Discharge status: Home / Self Care | Payer: BC Managed Care – PPO | Source: Ambulatory Visit | Attending: Radiation Oncology | Admitting: Radiation Oncology

## 2014-07-24 ENCOUNTER — Encounter: Payer: Self-pay | Admitting: Radiation Oncology

## 2014-07-24 VITALS — BP 105/51 | HR 92 | Temp 97.9°F | Resp 16 | Ht 65.0 in | Wt 138.9 lb

## 2014-07-24 DIAGNOSIS — Z510 Encounter for antineoplastic radiation therapy: Secondary | ICD-10-CM | POA: Diagnosis not present

## 2014-07-24 DIAGNOSIS — C9000 Multiple myeloma not having achieved remission: Secondary | ICD-10-CM | POA: Insufficient documentation

## 2014-07-24 HISTORY — DX: Raynaud's syndrome without gangrene: I73.00

## 2014-07-24 NOTE — Progress Notes (Signed)
Please see the Nurse Progress Note in the MD Initial Consult Encounter for this patient. 

## 2014-07-24 NOTE — Telephone Encounter (Signed)
Pt has been been enrolled in the [employer] Program for the calendar year of 2015.  Under this program, [employer] will provide assistance to reduce your patient's co-pay to $25 per prescription for REVLIMID. Benefit maximum of $10,000   Exp: 08/21/2014 Grant: $10,000 ID: [id number]     COPY SCANNED

## 2014-07-24 NOTE — Progress Notes (Signed)
Radiation Oncology         [id number]) [phone] ________________________________  Initial outpatient Consultation  Name: Samantha Moyer MRN: [id number]  Date: 07/24/2014  DOB: 1965-11-30  WJ:XBJY, Samantha Sayre, MD  Volanda Napoleon, MD   REFERRING PHYSICIAN: Volanda Napoleon, MD  DIAGNOSIS: IgA lambda myeloma   HISTORY OF PRESENT ILLNESS::Samantha Moyer is a 48 y.o. female who is seen out courtesy of Dr. Marin Olp for an opinion concerning radiation therapy as part of management of patient's recently diagnosed and advanced myeloma. The patient presented with pain involving theleft shoulder/ left humerus. Imaging revealed a lytic area in the proximal left humerus. MRI confirmed a large lesion in this area with some soft tissue extension. She was seen by Dr. Kathi Der and had orthopedic stabilization.  A biopsy was performed of the left proximal humerus which revealed plasma cell neoplasm. the patient is felt to have an aggressive myeloma on the basis of workup thus far. She will proceed with Velcade Revlimid and Decadron as well as Zometa for systemic management. Patient is been consultation for consideration for postoperative treatments directed at the left humerus.Marland Kitchen  PREVIOUS RADIATION THERAPY: No  PAST MEDICAL HISTORY:  has a past medical history of Scleroderma and IgA myeloma (07/21/2014).    PAST SURGICAL HISTORY: Past Surgical History  Procedure Laterality Date  . Nasal septum surgery      FAMILY HISTORY: family history includes Diabetes in her mother; Heart disease in her mother; Hyperlipidemia in her mother; Hypertension in her mother.  SOCIAL HISTORY:  reports that she has never smoked. She has never used smokeless tobacco. She reports that she does not drink alcohol or use illicit drugs.  ALLERGIES: Codeine and Tramadol  MEDICATIONS:  Current Outpatient Prescriptions  Medication Sig Dispense Refill  . dexamethasone (DECADRON) 4 MG tablet Take 5 pills once a week with food 80 tablet 3  .  famciclovir (FAMVIR) 500 MG tablet Take 1 pill a day 30 tablet 6  . HYDROcodone-acetaminophen (NORCO/VICODIN) 5-325 MG per tablet Take 1 tablet by mouth every 6 (six) hours as needed for moderate pain. 20 tablet 0  . Ibuprofen 200 MG CAPS Take by mouth as needed.    Marland Kitchen lenalidomide (REVLIMID) 25 MG capsule Take 1 pill at nighttime for 21 days on and 7 days off. 21 capsule 6  . Multiple Vitamins-Minerals (ALIVE WOMENS ENERGY) TABS Take 1 tablet by mouth daily.     No current facility-administered medications for this encounter.    REVIEW OF SYSTEMS:  A 15 point review of systems is documented in the electronic medical record. This was obtained by the nursing staff. However, I reviewed this with the patient to discuss relevant findings and make appropriate changes.  She continues to have pain in the left shoulder and left proximal arm. She also feels she may have some mild weakness in her left hand. Range of motion is limited in light of her pain. she denies any other areas of pain.   PHYSICAL EXAM:  BP 105/51 mmHg  Pulse 92  Temp(Src) 97.9 F (36.6 C) (Oral)  Resp 16  Ht 5' 5"  (1.651 m)  Wt 138 lb 14.4 oz (63.005 kg)  BMI 23.11 kg/m2  General Appearance:    Alert, cooperative, no distress, appears stated age  Head:    Normocephalic, without obvious abnormality, atraumatic  Eyes:    PERRL, conjunctiva/corneas clear, EOM's intact,         Nose:   Nares normal, septum midline, mucosa normal, no drainage  or sinus tenderness  Throat:   Lips, mucosa, and tongue normal; partial in place and gums normal  Neck:   Supple, symmetrical, trachea midline, no adenopathy;    thyroid:  no enlargement/tenderness/nodules; no carotid   bruit or JVD  Back:     Symmetric, no curvature, ROM normal, no CVA tenderness  Lungs:     Clear to auscultation bilaterally, respirations unlabored  Chest Wall:    No tenderness or deformity   Heart:    Regular rate and rhythm, S1 and S2 normal, no murmur, rub   or  gallop     Abdomen:     Soft, non-tender, bowel sounds active all four quadrants,    no masses, no organomegaly        Extremities:   Extremities normal, atraumatic, no cyanosis or edema  Pulses:   2+ and symmetric all extremities  Skin:   Skin color, texture, turgor normal, no rashes or lesions  Lymph nodes:   Cervical, supraclavicular, and axillary nodes normal  Neurologic:   normal strength except for mild decreased grip strength along the left side, the patient has limited left arm shoulder mobility in light of her pain,  sensation and reflexes    throughout     ECOG = 2  2 - Symptomatic, <50% in bed during the day (Ambulatory and capable of all self care but unable to carry out any work activities. Up and about more than 50% of waking hours)  LABORATORY DATA:  Lab Results  Component Value Date   WBC 8.6 07/21/2014   HGB 9.7* 07/21/2014   HCT 32.5* 07/21/2014   MCV 99 07/21/2014   PLT 408* 07/21/2014   NEUTROABS 5.4 07/21/2014   Lab Results  Component Value Date   NA 146* 07/21/2014   K 5.2* 07/21/2014   CL 99 07/21/2014   CO2 31 07/21/2014   GLUCOSE 96 07/21/2014   CREATININE 0.7 07/21/2014   CALCIUM 10.0 07/21/2014      RADIOGRAPHY: Dg Chest 2 View  06/28/2014   CLINICAL DATA:  Metastatic lesion involving the left humerus.  EXAM: CHEST  2 VIEW  COMPARISON:  Left humerus and shoulder radiographs - earlier same day  FINDINGS: Borderline enlarged cardiac silhouette and mediastinal contours with atherosclerotic plaque within the thoracic aorta. Evaluation of the retrosternal clear space is obscured secondary to overlying soft tissues. Bibasilar heterogeneous interstitial opacities. No discrete focal airspace opacities or discrete pulmonary mass. No pleural effusion or pneumothorax. Known lytic lesion involving the medial aspect of the proximal humeral metaphysis is incompletely visualized. Regional soft tissues appear normal.  IMPRESSION: 1. Bibasilar interstitial  opacities, nonspecific and while possibly represent atelectasis, atypical infection could have a similar appearance. 2. No discrete pulmonary mass. 3. Cardiomegaly without evidence of edema. 4. Known indeterminate lytic lesion involving the proximal humeral metaphysis is incompletely evaluated on this examination, though again worrisome for metastatic disease. Again, further evaluation with CT of the chest, abdomen and pelvis is recommended to evaluate for a primary malignancy.   Electronically Signed   By: Sandi Mariscal M.D.   On: 06/28/2014 08:02   Mr Shoulder Left W Wo Contrast  07/03/2014   CLINICAL DATA:  Lytic lesion in the left proximal humerus.  EXAM: MRI OF THE LEFT SHOULDER WITHOUT AND WITH CONTRAST  TECHNIQUE: Multiplanar, multisequence MR imaging was performed both before and after administration of intravenous contrast.  CONTRAST:  73m MULTIHANCE GADOBENATE DIMEGLUMINE 529 MG/ML IV SOLN  COMPARISON:  None.  FINDINGS: There is a  well-circumscribed, expansile mass in the surgical neck of the left proximal humerus measuring 3.9 x 3.8 x 3.8 cm. There is surrounding marrow edema. There is cortical destruction of the anterior medial cortex. There is an extraosseous soft tissue component along the inferior margin of the mass along the medial aspect of the proximal humeral diaphysis measuring 2.4 x 0.8 cm.  There is a second lesion measuring 12.5 mm in the proximal left humeral diaphysis with minimal endosteal scalloping. There are 2 other smaller T1 hypointense lesions measuring less than 1 cm.  There is left axillary lymphadenopathy with the largest lymph node measuring 11 mm in short axis. There is a enlarged subpectoral lymph node measuring 10 mm in short axis.  There is mild insertional tendinosis of the supraspinatus tendon and infraspinatus tendon without a discrete tear. The subscapularis tendon is intact. The teres minor tendon is intact. The labrum is grossly intact, but evaluation is limited by  lack of intraarticular fluid. There is no joint effusion. There is no chondral defect. There is no dislocation. There are mild degenerative changes of the acromioclavicular joint. There is a Type II acromion. There is no subacromial/subdeltoid bursal fluid. The long head of the biceps tendon is intact. The muscles are normal in signal.  IMPRESSION: 1. Well-circumscribed expansile mass in the surgical neck of the left proximal humerus as described above. Smaller lesions in the proximal humeral diaphysis. Left axillary lymphadenopathy is present. Differential diagnosis includes multiple myeloma versus metastatic disease. Correlation with mammography is recommended as well as serum and urine protein electrophoresis.   Electronically Signed   By: Kathreen Devoid   On: 07/03/2014 08:35   Dg Shoulder Left  06/27/2014   CLINICAL DATA:  Shoulder pain, no trauma.  History of scleroderma.  EXAM: LEFT SHOULDER - 2+ VIEW  COMPARISON:  None.  FINDINGS: There is lytic destruction of the medial aspect of the left humeral neck. No displaced fracture is identified. The humeral head is properly located. No soft tissue abnormality.  IMPRESSION: Lytic lesion involving the medial aspect of the left proximal humerus. Differential considerations include metastasis, myeloma, or less likely osteomyelitis. Consider further evaluation with bone scan and/or correlation with lab exams to determine further appropriate study such as PET-CT.   Electronically Signed   By: Conchita Paris M.D.   On: 06/27/2014 20:26   Dg Abd Acute W/chest  07/02/2014   CLINICAL DATA:  Nausea, lightheadedness, worsening with ambulation. Symptoms for 1 day. Diaphoresis.  EXAM: ACUTE ABDOMEN SERIES (ABDOMEN 2 VIEW & CHEST 1 VIEW)  COMPARISON:  06/27/2014  FINDINGS: Heart is borderline in size. Bibasilar interstitial opacities are stable since prior study. This could reflect atelectasis, a is scratch head atypical infection or chronic interstitial lung disease. No  effusions.  Nonobstructive bowel gas pattern. No free air. No organomegaly or suspicious calcification. No acute bony abnormality.  IMPRESSION: No evidence of bowel obstruction or free air.  Stable interstitial opacities in the lung bases which could represent atelectasis, atypical infection or chronic interstitial lung disease.  Mild cardiomegaly.   Electronically Signed   By: Rolm Baptise M.D.   On: 07/02/2014 20:26   Dg Bone Survey Met  07/04/2014   CLINICAL DATA:  Initial staging for multiple myeloma ; known lytic lesion of the proximal left humerus.  EXAM: METASTATIC BONE SURVEY  COMPARISON:  PA and lateral chest x-ray of June 27, 2014.  FINDINGS: The lungs are adequately inflated. The interstitial markings remain increased and are stable. There is no alveolar  infiltrate. The cardiac silhouette is normal in size. The pulmonary vascularity is not engorged. The trachea is midline.  The calvarium exhibits numerous lucent lesions of varying sizes up to 7 mm consistent with myelomatous involvement. The cervical, thoracic, and lumbar spines exhibit no lytic nor blastic lesion. There is mild disc space narrowing at L4-5. The bony pelvis is intact. There are no abnormal bony findings in the right humerus. On the left there is a lytic process involving the base of the femoral head and much of the femoral neck extending into the proximal diaphysis. There is no soft tissue calcification. More distally the shaft of the left humerus is normal. The forearms are unremarkable.  There is mild osteoarthritic change of both hips greater on the left than on the right. No lytic or blastic lesions of the femurs or tibias or fibulas are demonstrated.  IMPRESSION: 1. There are numerous subcentimeter lytic lesions within the calvarium. There is a lytic lesion involving the proximal left humeral as described. No other similar lytic processes are demonstrated elsewhere. 2. There are persistently increased interstitial markings in  both lungs without evidence of discrete infiltrate or mass. 3. There is mild degenerative disc space narrowing at L4-5. There is mild degenerative change of both hips.   Electronically Signed   By: David  Martinique   On: 07/04/2014 14:31   Dg Humerus Left  06/27/2014   CLINICAL DATA:  Shoulder pain.  EXAM: LEFT HUMERUS - 2+ VIEW  COMPARISON:  None.  FINDINGS: There is cortical erosion involving the proximal humerus extending from the metaphysis into the radial humeral head along the medial border. There is an irregular margin to this lytic process. No evidence of acute fracture dislocation.  IMPRESSION: Aggressive lytic lesion in the proximal femur is consistent with a metastatic lesion, primary bone tumor or less likely aggressive infection. Consider chest CT with contrast to evaluate for primary neoplasm.  These results will be called to the ordering clinician or representative by the Radiologist Assistant, and communication documented in the PACS or zVision Dashboard.   Electronically Signed   By: Suzy Bouchard M.D.   On: 06/27/2014 20:07      IMPRESSION:  IgA lambda myeloma with significant involvement of the left humerus.  The patient would be a good candidate for postoperative radiation treatments directed at the left proximal arm. I discussed the treatment course side effects and potential toxicities of radiation therapy in this situation with the patient. She appears to understand and wishes to proceed with planned course of treatment.  PLAN:simulation and planning in the near future with treatments to begin next week. Anticipate 10 postoperative treatments.   I spent 60 minutes minutes face to face with the patient and more than 50% of that time was spent in counseling and/or coordination of care.   ------------------------------------------------  Blair Promise, PhD, MD

## 2014-07-25 ENCOUNTER — Other Ambulatory Visit: Payer: Self-pay | Admitting: *Deleted

## 2014-07-25 ENCOUNTER — Other Ambulatory Visit: Payer: BC Managed Care – PPO

## 2014-07-25 DIAGNOSIS — C9000 Multiple myeloma not having achieved remission: Secondary | ICD-10-CM

## 2014-07-25 MED ORDER — HYDROMORPHONE HCL 2 MG PO TABS: 2.0000 mg | ORAL_TABLET | ORAL | Status: DC | PRN

## 2014-07-28 ENCOUNTER — Other Ambulatory Visit: Payer: BC Managed Care – PPO

## 2014-07-29 ENCOUNTER — Ambulatory Visit
Admission: RE | Admit: 2014-07-29 | Discharge: 2014-07-29 | Disposition: A | Discharge status: Home / Self Care | Payer: BC Managed Care – PPO | Source: Ambulatory Visit | Attending: Radiation Oncology | Admitting: Radiation Oncology

## 2014-07-29 DIAGNOSIS — Z510 Encounter for antineoplastic radiation therapy: Secondary | ICD-10-CM | POA: Diagnosis not present

## 2014-07-29 DIAGNOSIS — C9000 Multiple myeloma not having achieved remission: Secondary | ICD-10-CM

## 2014-07-29 NOTE — Progress Notes (Signed)
  Radiation Oncology         [phone]) [phone] ________________________________  Name: Dawson Hollman MRN: [id number]  Date: 07/29/2014  DOB: 11-15-1965  SIMULATION AND TREATMENT PLANNING NOTE    ICD-9-CM ICD-10-CM   1. IgA myeloma 203.00 C90.00     DIAGNOSIS:  Multiple myeloma involving the left humerus  NARRATIVE:  The patient was brought to the [facility].  Identity was confirmed.  All relevant records and images related to the planned course of therapy were reviewed.  The patient freely provided informed written consent to proceed with treatment after reviewing the details related to the planned course of therapy. The consent form was witnessed and verified by the simulation staff.  Then, the patient was set-up in a stable reproducible  supine position for radiation therapy.  CT images were obtained.  Surface markings were placed.  The CT images were loaded into the planning software.  Then the target and avoidance structures were contoured.  Treatment planning then occurred.  The radiation prescription was entered and confirmed.  Then, I designed and supervised the construction of a total of 3 medically necessary complex treatment devices.  I have requested : Isodose Plan.  I have ordered: dose calc.  PLAN:  The patient will receive 30 Gy in 10 fractions directed at the left humerus area.  ________________________________  -----------------------------------  Blair Promise, PhD, MD

## 2014-07-30 ENCOUNTER — Telehealth: Payer: Self-pay | Admitting: Hematology & Oncology

## 2014-07-30 ENCOUNTER — Encounter: Payer: Self-pay | Admitting: Hematology & Oncology

## 2014-07-30 ENCOUNTER — Ambulatory Visit (HOSPITAL_BASED_OUTPATIENT_CLINIC_OR_DEPARTMENT_OTHER): Payer: BC Managed Care – PPO

## 2014-07-30 DIAGNOSIS — C9000 Multiple myeloma not having achieved remission: Secondary | ICD-10-CM

## 2014-07-30 DIAGNOSIS — Z510 Encounter for antineoplastic radiation therapy: Secondary | ICD-10-CM | POA: Diagnosis not present

## 2014-07-30 DIAGNOSIS — Z5112 Encounter for antineoplastic immunotherapy: Secondary | ICD-10-CM

## 2014-07-30 MED ORDER — SODIUM CHLORIDE 0.9 % IJ SOLN
10.0000 mL | INTRAMUSCULAR | Status: DC | PRN
  Filled 2014-07-30: qty 10

## 2014-07-30 MED ORDER — PROCHLORPERAZINE MALEATE 10 MG PO TABS: 10.0000 mg | ORAL_TABLET | Freq: Four times a day (QID) | ORAL | Status: DC | PRN

## 2014-07-30 MED ORDER — ONDANSETRON HCL 8 MG PO TABS
8.0000 mg | ORAL_TABLET | Freq: Once | ORAL | Status: AC
  Administered 2014-07-30: 8 mg via ORAL

## 2014-07-30 MED ORDER — SODIUM CHLORIDE 0.9 % IV SOLN
Freq: Once | INTRAVENOUS | Status: AC
  Administered 2014-07-30: 09:00:00 via INTRAVENOUS

## 2014-07-30 MED ORDER — ONDANSETRON HCL 8 MG PO TABS: 8.0000 mg | ORAL_TABLET | Freq: Two times a day (BID) | ORAL | Status: DC

## 2014-07-30 MED ORDER — ZOLEDRONIC ACID 4 MG/100ML IV SOLN
4.0000 mg | Freq: Once | INTRAVENOUS | Status: AC
  Administered 2014-07-30: 4 mg via INTRAVENOUS
  Filled 2014-07-30: qty 100

## 2014-07-30 MED ORDER — ONDANSETRON HCL 8 MG PO TABS
ORAL_TABLET | ORAL | Status: AC
  Filled 2014-07-30: qty 1

## 2014-07-30 MED ORDER — BORTEZOMIB CHEMO SQ INJECTION 3.5 MG (2.5MG/ML)
1.3000 mg/m2 | Freq: Once | INTRAMUSCULAR | Status: AC
  Administered 2014-07-30: 2.25 mg via SUBCUTANEOUS
  Filled 2014-07-30: qty 2.25

## 2014-07-30 MED ORDER — LORAZEPAM 0.5 MG PO TABS: 0.5000 mg | ORAL_TABLET | Freq: Four times a day (QID) | ORAL | Status: DC | PRN

## 2014-07-30 NOTE — Patient Instructions (Addendum)
[facility] Discharge Instructions for Patients Receiving Chemotherapy  Today you received the following chemotherapy agents:  Velcade  To help prevent nausea and vomiting after your treatment, we encourage you to take your nausea medication.   If you develop nausea and vomiting that is not controlled by your nausea medication, call the clinic.   BELOW ARE SYMPTOMS THAT SHOULD BE REPORTED IMMEDIATELY:  *FEVER GREATER THAN 100.5 F  *CHILLS WITH OR WITHOUT FEVER  NAUSEA AND VOMITING THAT IS NOT CONTROLLED WITH YOUR NAUSEA MEDICATION  *UNUSUAL SHORTNESS OF BREATH  *UNUSUAL BRUISING OR BLEEDING  TENDERNESS IN MOUTH AND THROAT WITH OR WITHOUT PRESENCE OF ULCERS  *URINARY PROBLEMS  *BOWEL PROBLEMS  UNUSUAL RASH Items with * indicate a potential emergency and should be followed up as soon as possible.  Feel free to call the clinic you have any questions or concerns. The clinic phone number is [phone].   Zoledronic Acid injection (Hypercalcemia, Oncology) What is this medicine? ZOLEDRONIC ACID (ZOE le dron ik AS id) lowers the amount of calcium loss from bone. It is used to treat too much calcium in your blood from cancer. It is also used to prevent complications of cancer that has spread to the bone. This medicine may be used for other purposes; ask your health care provider or pharmacist if you have questions. COMMON BRAND NAME(S): Zometa What should I tell my health care provider before I take this medicine? They need to know if you have any of these conditions: -aspirin-sensitive asthma -cancer, especially if you are receiving medicines used to treat cancer -dental disease or wear dentures -infection -kidney disease -receiving corticosteroids like dexamethasone or prednisone -an unusual or allergic reaction to zoledronic acid, other medicines, foods, dyes, or preservatives -pregnant or trying to get pregnant -breast-feeding How should I use this  medicine? This medicine is for infusion into a vein. It is given by a health care professional in a hospital or clinic setting. Talk to your pediatrician regarding the use of this medicine in children. Special care may be needed. Overdosage: If you think you have taken too much of this medicine contact a poison control center or emergency room at once. NOTE: This medicine is only for you. Do not share this medicine with others. What if I miss a dose? It is important not to miss your dose. Call your doctor or health care professional if you are unable to keep an appointment. What may interact with this medicine? -certain antibiotics given by injection -NSAIDs, medicines for pain and inflammation, like ibuprofen or naproxen -some diuretics like bumetanide, furosemide -teriparatide -thalidomide This list may not describe all possible interactions. Give your health care provider a list of all the medicines, herbs, non-prescription drugs, or dietary supplements you use. Also tell them if you smoke, drink alcohol, or use illegal drugs. Some items may interact with your medicine. What should I watch for while using this medicine? Visit your doctor or health care professional for regular checkups. It may be some time before you see the benefit from this medicine. Do not stop taking your medicine unless your doctor tells you to. Your doctor may order blood tests or other tests to see how you are doing. Women should inform their doctor if they wish to become pregnant or think they might be pregnant. There is a potential for serious side effects to an unborn child. Talk to your health care professional or pharmacist for more information. You should make sure that you get enough  calcium and vitamin D while you are taking this medicine. Discuss the foods you eat and the vitamins you take with your health care professional. Some people who take this medicine have severe bone, joint, and/or muscle pain. This  medicine may also increase your risk for jaw problems or a broken thigh bone. Tell your doctor right away if you have severe pain in your jaw, bones, joints, or muscles. Tell your doctor if you have any pain that does not go away or that gets worse. Tell your dentist and dental surgeon that you are taking this medicine. You should not have major dental surgery while on this medicine. See your dentist to have a dental exam and fix any dental problems before starting this medicine. Take good care of your teeth while on this medicine. Make sure you see your dentist for regular follow-up appointments. What side effects may I notice from receiving this medicine? Side effects that you should report to your doctor or health care professional as soon as possible: -allergic reactions like skin rash, itching or hives, swelling of the face, lips, or tongue -anxiety, confusion, or depression -breathing problems -changes in vision -eye pain -feeling faint or lightheaded, falls -jaw pain, especially after dental work -mouth sores -muscle cramps, stiffness, or weakness -trouble passing urine or change in the amount of urine Side effects that usually do not require medical attention (report to your doctor or health care professional if they continue or are bothersome): -bone, joint, or muscle pain -constipation -diarrhea -fever -hair loss -irritation at site where injected -loss of appetite -nausea, vomiting -stomach upset -trouble sleeping -trouble swallowing -weak or tired This list may not describe all possible side effects. Call your doctor for medical advice about side effects. You may report side effects to FDA at 1-800-FDA-1088. Where should I keep my medicine? This drug is given in a hospital or clinic and will not be stored at home. NOTE: This sheet is a summary. It may not cover all possible information. If you have questions about this medicine, talk to your doctor, pharmacist, or health  care provider.  2015, [employer]vier/Gold Standard. (2013-01-17 13:03:13) Bortezomib injection What is this medicine? BORTEZOMIB (bor TEZ oh mib) is a chemotherapy drug. It slows the growth of cancer cells. This medicine is used to treat multiple myeloma, and certain lymphomas, such as mantle-cell lymphoma. This medicine may be used for other purposes; ask your health care provider or pharmacist if you have questions. COMMON BRAND NAME(S): Velcade What should I tell my health care provider before I take this medicine? They need to know if you have any of these conditions: -diabetes -heart disease -irregular heartbeat -liver disease -on hemodialysis -low blood counts, like low white blood cells, platelets, or hemoglobin -peripheral neuropathy -taking medicine for blood pressure -an unusual or allergic reaction to bortezomib, mannitol, boron, other medicines, foods, dyes, or preservatives -pregnant or trying to get pregnant -breast-feeding How should I use this medicine? This medicine is for injection into a vein or for injection under the skin. It is given by a health care professional in a hospital or clinic setting. Talk to your pediatrician regarding the use of this medicine in children. Special care may be needed. Overdosage: If you think you have taken too much of this medicine contact a poison control center or emergency room at once. NOTE: This medicine is only for you. Do not share this medicine with others. What if I miss a dose? It is important not to miss your dose.  Call your doctor or health care professional if you are unable to keep an appointment. What may interact with this medicine? This medicine may interact with the following medications: -ketoconazole -rifampin -ritonavir -St. John's Wort This list may not describe all possible interactions. Give your health care provider a list of all the medicines, herbs, non-prescription drugs, or dietary supplements you use. Also  tell them if you smoke, drink alcohol, or use illegal drugs. Some items may interact with your medicine. What should I watch for while using this medicine? Visit your doctor for checks on your progress. This drug may make you feel generally unwell. This is not uncommon, as chemotherapy can affect healthy cells as well as cancer cells. Report any side effects. Continue your course of treatment even though you feel ill unless your doctor tells you to stop. You may get drowsy or dizzy. Do not drive, use machinery, or do anything that needs mental alertness until you know how this medicine affects you. Do not stand or sit up quickly, especially if you are an older patient. This reduces the risk of dizzy or fainting spells. In some cases, you may be given additional medicines to help with side effects. Follow all directions for their use. Call your doctor or health care professional for advice if you get a fever, chills or sore throat, or other symptoms of a cold or flu. Do not treat yourself. This drug decreases your body's ability to fight infections. Try to avoid being around people who are sick. This medicine may increase your risk to bruise or bleed. Call your doctor or health care professional if you notice any unusual bleeding. You may need blood work done while you are taking this medicine. In some patients, this medicine may cause a serious brain infection that may cause death. If you have any problems seeing, thinking, speaking, walking, or standing, tell your doctor right away. If you cannot reach your doctor, urgently seek other source of medical care. Do not become pregnant while taking this medicine. Women should inform their doctor if they wish to become pregnant or think they might be pregnant. There is a potential for serious side effects to an unborn child. Talk to your health care professional or pharmacist for more information. Do not breast-feed an infant while taking this medicine. Check  with your doctor or health care professional if you get an attack of severe diarrhea, nausea and vomiting, or if you sweat a lot. The loss of too much body fluid can make it dangerous for you to take this medicine. What side effects may I notice from receiving this medicine? Side effects that you should report to your doctor or health care professional as soon as possible: -allergic reactions like skin rash, itching or hives, swelling of the face, lips, or tongue -breathing problems -changes in hearing -changes in vision -fast, irregular heartbeat -feeling faint or lightheaded, falls -pain, tingling, numbness in the hands or feet -right upper belly pain -seizures -swelling of the ankles, feet, hands -unusual bleeding or bruising -unusually weak or tired -vomiting -yellowing of the eyes or skin Side effects that usually do not require medical attention (report to your doctor or health care professional if they continue or are bothersome): -changes in emotions or moods -constipation -diarrhea -loss of appetite -headache -irritation at site where injected -nausea This list may not describe all possible side effects. Call your doctor for medical advice about side effects. You may report side effects to FDA at 1-800-FDA-1088. Where should  I keep my medicine? This drug is given in a hospital or clinic and will not be stored at home. NOTE: This sheet is a summary. It may not cover all possible information. If you have questions about this medicine, talk to your doctor, pharmacist, or health care provider.  2015, Elsevier/Gold Standard. (2013-06-03 12:46:32)

## 2014-07-30 NOTE — Telephone Encounter (Addendum)
[employer] -NPR for CHEMO   J3490 ondansetron 8 MG Tabs      J3489 Zoledronic Acid 4 MG/100ML Soln 100 mL Plas Cont    J7050 sodium chloride 0.9 % Soln    J9041 bortezomib IV 3.5 MG Solr   IgA Myeloma dx

## 2014-07-31 ENCOUNTER — Other Ambulatory Visit: Payer: Self-pay | Admitting: Radiology

## 2014-07-31 ENCOUNTER — Ambulatory Visit
Admission: RE | Admit: 2014-07-31 | Discharge: 2014-07-31 | Disposition: A | Discharge status: Home / Self Care | Payer: BC Managed Care – PPO | Source: Ambulatory Visit | Attending: Radiation Oncology | Admitting: Radiation Oncology

## 2014-07-31 DIAGNOSIS — Z510 Encounter for antineoplastic radiation therapy: Secondary | ICD-10-CM | POA: Diagnosis not present

## 2014-07-31 DIAGNOSIS — C9000 Multiple myeloma not having achieved remission: Secondary | ICD-10-CM

## 2014-07-31 NOTE — Progress Notes (Signed)
  Radiation Oncology         [phone] ________________________________  Name: Samantha Moyer MRN: [id number]  Date: 07/31/2014  DOB: 1966/01/08  Simulation Verification Note    ICD-9-CM ICD-10-CM   1. IgA myeloma 203.00 C90.00     Status: outpatient  NARRATIVE: The patient was brought to the treatment unit and placed in the planned treatment position. The clinical setup was verified. Then port films were obtained and uploaded to the radiation oncology medical record software.  The treatment beams were carefully compared against the planned radiation fields. The position location and shape of the radiation fields was reviewed. They targeted volume of tissue appears to be appropriately covered by the radiation beams. Organs at risk appear to be excluded as planned.  Based on my personal review, I approved the simulation verification. The patient's treatment will proceed as planned.  -----------------------------------  Blair Promise, PhD, MD

## 2014-08-01 ENCOUNTER — Encounter: Payer: Self-pay | Admitting: *Deleted

## 2014-08-01 ENCOUNTER — Ambulatory Visit
Admission: RE | Admit: 2014-08-01 | Discharge: 2014-08-01 | Disposition: A | Discharge status: Home / Self Care | Payer: BC Managed Care – PPO | Source: Ambulatory Visit | Attending: Radiation Oncology | Admitting: Radiation Oncology

## 2014-08-01 DIAGNOSIS — Z510 Encounter for antineoplastic radiation therapy: Secondary | ICD-10-CM | POA: Diagnosis not present

## 2014-08-01 DIAGNOSIS — C9000 Multiple myeloma not having achieved remission: Secondary | ICD-10-CM | POA: Insufficient documentation

## 2014-08-01 MED ORDER — RADIAPLEXRX EX GEL
Freq: Once | CUTANEOUS | Status: AC
  Administered 2014-08-01: 15:00:00 via TOPICAL

## 2014-08-01 NOTE — Progress Notes (Signed)
Samantha Moyer was given the Radiation Therapy and You book and discussed potential side effects/management of fatigue and skin changes.  She was given the skin care handout.  She was also given radiaplex gel and was instructed to apply it to the treatment area on her left arm twice a day, after treatment and bedtime.  She was educated about under treat day with Dr. Sondra Come on Tuesday's.  She was encouraged to call with any questions or concerns.  Samantha Moyer verbalized agreement.

## 2014-08-01 NOTE — Progress Notes (Signed)
Samantha Moyer Psychosocial Distress Screening Clinical Social Work  Clinical Social Work was referred by distress screening protocol.  The patient scored a 6 on the Psychosocial Distress Thermometer which indicates moderate distress. Clinical Social Worker met with pt after teaching to assess for distress and other psychosocial needs. Pt had her friend Samantha Moyer bring her today and she reports to have lots of support people to help her with transportation, emotional support, etc. Pt is concerned about finances and insurance CSW will refer to Development worker, community. Pt is also interested in completing HCPOA paperwork and CSW will meet with pt next Friday at 1pm to complete. CSW also reviewed Patient and [facility] resources. Pt registered for cooking class and is very interested in additional supports. Pt very appreciative of emotional support and discussion today. CSW to follow and see next week.   ONCBCN DISTRESS SCREENING 07/24/2014  Screening Type Initial Screening  Distress experienced in past week (1-10) 6  Practical problem type Insurance;Work/school  Emotional problem type Adjusting to illness  Physical Problem type Pain;Sleep/insomnia;Getting around;Bathing/dressing;Loss of appetitie  Physician notified of physical symptoms Yes    Clinical Social Worker follow up needed: Yes.    If yes, follow up plan: See above Loren Racer, [place] Worker [place]  [facility] Phone: [phone] Fax: [phone]

## 2014-08-03 ENCOUNTER — Inpatient Hospital Stay (HOSPITAL_COMMUNITY)
Admission: EM | Admit: 2014-08-03 | Discharge: 2014-08-08 | DRG: 871 | Disposition: A | Discharge status: Home / Self Care | Payer: BC Managed Care – PPO | Attending: Internal Medicine | Admitting: Internal Medicine

## 2014-08-03 ENCOUNTER — Encounter (HOSPITAL_COMMUNITY): Payer: Self-pay | Admitting: Emergency Medicine

## 2014-08-03 ENCOUNTER — Emergency Department (HOSPITAL_COMMUNITY): Payer: BC Managed Care – PPO

## 2014-08-03 DIAGNOSIS — E876 Hypokalemia: Secondary | ICD-10-CM | POA: Diagnosis not present

## 2014-08-03 DIAGNOSIS — A419 Sepsis, unspecified organism: Principal | ICD-10-CM | POA: Diagnosis present

## 2014-08-03 DIAGNOSIS — Z885 Allergy status to narcotic agent status: Secondary | ICD-10-CM | POA: Diagnosis not present

## 2014-08-03 DIAGNOSIS — D649 Anemia, unspecified: Secondary | ICD-10-CM | POA: Diagnosis present

## 2014-08-03 DIAGNOSIS — M25519 Pain in unspecified shoulder: Secondary | ICD-10-CM

## 2014-08-03 DIAGNOSIS — C9000 Multiple myeloma not having achieved remission: Secondary | ICD-10-CM

## 2014-08-03 DIAGNOSIS — M7592 Shoulder lesion, unspecified, left shoulder: Secondary | ICD-10-CM

## 2014-08-03 DIAGNOSIS — D6481 Anemia due to antineoplastic chemotherapy: Secondary | ICD-10-CM | POA: Diagnosis present

## 2014-08-03 DIAGNOSIS — R509 Fever, unspecified: Secondary | ICD-10-CM

## 2014-08-03 DIAGNOSIS — D6959 Other secondary thrombocytopenia: Secondary | ICD-10-CM | POA: Diagnosis present

## 2014-08-03 DIAGNOSIS — Z6823 Body mass index (BMI) 23.0-23.9, adult: Secondary | ICD-10-CM

## 2014-08-03 DIAGNOSIS — R21 Rash and other nonspecific skin eruption: Secondary | ICD-10-CM | POA: Diagnosis present

## 2014-08-03 DIAGNOSIS — M25512 Pain in left shoulder: Secondary | ICD-10-CM | POA: Diagnosis present

## 2014-08-03 DIAGNOSIS — D899 Disorder involving the immune mechanism, unspecified: Secondary | ICD-10-CM

## 2014-08-03 DIAGNOSIS — L03115 Cellulitis of right lower limb: Secondary | ICD-10-CM | POA: Diagnosis present

## 2014-08-03 DIAGNOSIS — E43 Unspecified severe protein-calorie malnutrition: Secondary | ICD-10-CM | POA: Diagnosis present

## 2014-08-03 DIAGNOSIS — A408 Other streptococcal sepsis: Secondary | ICD-10-CM

## 2014-08-03 DIAGNOSIS — E871 Hypo-osmolality and hyponatremia: Secondary | ICD-10-CM | POA: Diagnosis present

## 2014-08-03 DIAGNOSIS — D849 Immunodeficiency, unspecified: Secondary | ICD-10-CM

## 2014-08-03 DIAGNOSIS — I7300 Raynaud's syndrome without gangrene: Secondary | ICD-10-CM | POA: Diagnosis present

## 2014-08-03 LAB — CBC WITH DIFFERENTIAL/PLATELET
Basophils Absolute: 0 10*3/uL (ref 0.0–0.1)
Basophils Relative: 0 % (ref 0–1)
Eosinophils Absolute: 0.1 10*3/uL (ref 0.0–0.7)
Eosinophils Relative: 1 % (ref 0–5)
HCT: 30.3 % — ABNORMAL LOW (ref 36.0–46.0)
HEMOGLOBIN: 9.5 g/dL — AB (ref 12.0–15.0)
LYMPHS ABS: 0.2 10*3/uL — AB (ref 0.7–4.0)
Lymphocytes Relative: 4 % — ABNORMAL LOW (ref 12–46)
MCH: 30.3 pg (ref 26.0–34.0)
MCHC: 31.4 g/dL (ref 30.0–36.0)
MCV: 96.5 fL (ref 78.0–100.0)
MONOS PCT: 2 % — AB (ref 3–12)
Monocytes Absolute: 0.2 10*3/uL (ref 0.1–1.0)
Neutro Abs: 5.8 10*3/uL (ref 1.7–7.7)
Neutrophils Relative %: 93 % — ABNORMAL HIGH (ref 43–77)
Platelets: 124 10*3/uL — ABNORMAL LOW (ref 150–400)
RBC: 3.14 MIL/uL — AB (ref 3.87–5.11)
RDW: 13 % (ref 11.5–15.5)
WBC: 6.2 10*3/uL (ref 4.0–10.5)

## 2014-08-03 LAB — COMPREHENSIVE METABOLIC PANEL
ALBUMIN: 2.5 g/dL — AB (ref 3.5–5.2)
ALK PHOS: 78 U/L (ref 39–117)
ALT: 20 U/L (ref 0–35)
ANION GAP: 17 — AB (ref 5–15)
AST: 34 U/L (ref 0–37)
BILIRUBIN TOTAL: 0.4 mg/dL (ref 0.3–1.2)
BUN: 9 mg/dL (ref 6–23)
CO2: 20 mEq/L (ref 19–32)
CREATININE: 0.7 mg/dL (ref 0.50–1.10)
Calcium: 8.3 mg/dL — ABNORMAL LOW (ref 8.4–10.5)
Chloride: 96 mEq/L (ref 96–112)
GFR calc non Af Amer: >90 mL/min (ref >90)
GLUCOSE: 94 mg/dL (ref 70–99)
POTASSIUM: 3.9 meq/L (ref 3.7–5.3)
Sodium: 133 mEq/L — ABNORMAL LOW (ref 137–147)
TOTAL PROTEIN: 9.5 g/dL — AB (ref 6.0–8.3)

## 2014-08-03 LAB — URINALYSIS, ROUTINE W REFLEX MICROSCOPIC
BILIRUBIN URINE: NEGATIVE
Glucose, UA: NEGATIVE mg/dL
Ketones, ur: NEGATIVE mg/dL
Leukocytes, UA: NEGATIVE
NITRITE: NEGATIVE
Protein, ur: NEGATIVE mg/dL
SPECIFIC GRAVITY, URINE: 1.009 (ref 1.005–1.030)
UROBILINOGEN UA: 1 mg/dL (ref 0.0–1.0)
pH: 7 (ref 5.0–8.0)

## 2014-08-03 LAB — URINE MICROSCOPIC-ADD ON

## 2014-08-03 LAB — I-STAT CG4 LACTIC ACID, ED
LACTIC ACID, VENOUS: 0.95 mmol/L (ref 0.5–2.2)
Lactic Acid, Venous: 0.49 mmol/L — ABNORMAL LOW (ref 0.5–2.2)

## 2014-08-03 MED ORDER — VANCOMYCIN HCL IN DEXTROSE 1-5 GM/200ML-% IV SOLN
1000.0000 mg | Freq: Once | INTRAVENOUS | Status: AC
  Administered 2014-08-03: 1000 mg via INTRAVENOUS
  Filled 2014-08-03: qty 200

## 2014-08-03 MED ORDER — ACETAMINOPHEN 325 MG PO TABS
650.0000 mg | ORAL_TABLET | Freq: Once | ORAL | Status: AC
Start: 2014-08-03 — End: 2014-08-03
  Administered 2014-08-03: 650 mg via ORAL
  Filled 2014-08-03: qty 2

## 2014-08-03 MED ORDER — SODIUM CHLORIDE 0.9 % IV BOLUS (SEPSIS)
1000.0000 mL | Freq: Once | INTRAVENOUS | Status: AC
  Administered 2014-08-03: 1000 mL via INTRAVENOUS

## 2014-08-03 MED ORDER — PIPERACILLIN-TAZOBACTAM 3.375 G IVPB 30 MIN
3.3750 g | Freq: Once | INTRAVENOUS | Status: AC
  Administered 2014-08-03: 3.375 g via INTRAVENOUS
  Filled 2014-08-03: qty 50

## 2014-08-03 NOTE — ED Notes (Signed)
Pt arrived to the ED with the complaint of a fever and a rash.  Pt has received chemo and radiation treatment on Wednesday and Thursday.  Pt states she has had a slightly elevated fever since yesterday but today her fever spiked at 103.  Pt states she has a rash on her right leg that developed over the last few days.  Pt also is scheduled for surgery in the morning for a bone marrow biopsy

## 2014-08-03 NOTE — ED Notes (Signed)
MD at bedside at this time.

## 2014-08-03 NOTE — ED Provider Notes (Signed)
CSN: [id number]     Arrival date & time 08/03/14  1856 History   First MD Initiated Contact with Patient 08/03/14 1906     Chief Complaint  Patient presents with  . Fever  . Rash     (Consider location/radiation/quality/duration/timing/severity/associated sxs/prior Treatment) The history is provided by the patient. No language interpreter was used.  Samantha Moyer is a 48 year old female with past medical history myeloma currently going through chemotherapy and radiation-last chemotherapy was Wednesday, radiation was Thursday presenting to the ED with fever that started yesterday. Patient reported that she's been having intermittent chills and dizziness more so with walking. Reported that she's been experiencing mild abdominal pain feeling as if she has to pass gas continuously-reported that she has been passing gas without difficulty. Patient reported that she had a BM today - stated that the bowel movement was normal. Patient reported that she noticed a rash earlier today starting on her right thigh - stated that the rash is red without pain or discomfort - reported that she noticed spreading of the rash to her chest and nose. Stated that she has been experiencing shortness of breath even at rest and stated that she has been feeling fatigued very easily. Reported decreased appetite. Reported that her fever was approximately one of 3.54F today. Denied chest pain, shortness of breath, difficulty breathing, diarrhea, melena, hematochezia, vomiting, numbness, tingling, dysuria, hematuria, neck pain, neck stiffness, tongue swelling, difficulty swallowing, head injury, blurred vision, sudden loss of vision. PCP Dr. Everlene Farrier Oncologist Dr. Marin Olp   Past Medical History  Diagnosis Date  . Scleroderma   . IgA myeloma 07/21/2014  . Raynaud disease    Past Surgical History  Procedure Laterality Date  . Nasal septum surgery    . Uterine fibroid embolization     Family History  Problem Relation Age of  Onset  . Heart disease Mother   . Diabetes Mother   . Hypertension Mother   . Hyperlipidemia Mother   . Cancer Brother    History  Substance Use Topics  . Smoking status: Never Smoker   . Smokeless tobacco: Never Used     Comment: NEVER USED TOBACCO  . Alcohol Use: No   OB History    No data available     Review of Systems  Constitutional: Positive for fever and fatigue. Negative for chills.  Respiratory: Negative for chest tightness and shortness of breath.   Cardiovascular: Negative for chest pain.  Gastrointestinal: Positive for abdominal pain. Negative for nausea, vomiting, diarrhea, constipation, blood in stool and anal bleeding.  Genitourinary: Negative for dysuria, decreased urine volume, vaginal bleeding, vaginal discharge and vaginal pain.  Musculoskeletal: Negative for back pain and neck pain.  Neurological: Negative for dizziness, weakness and headaches.      Allergies  Codeine and Tramadol  Home Medications   Prior to Admission medications   Medication Sig Start Date End Date Taking? Authorizing Provider  aspirin 325 MG EC tablet Take 325 mg by mouth daily.   Yes Volanda Napoleon, MD  dexamethasone (DECADRON) 4 MG tablet Take 5 pills once a week with food Patient taking differently: Take 20 mg by mouth once a week. Take 5 pills once a week with food 07/21/14  Yes Volanda Napoleon, MD  famciclovir (FAMVIR) 500 MG tablet Take 1 pill a day Patient taking differently: Take 500 mg by mouth daily.  07/21/14  Yes Volanda Napoleon, MD  hyaluronate sodium (RADIAPLEXRX) GEL Apply 1 application topically once.   Yes Historical  Provider, MD  HYDROmorphone (DILAUDID) 2 MG tablet Take 1 tablet (2 mg total) by mouth every 4 (four) hours as needed for severe pain. 07/25/14  Yes Volanda Napoleon, MD  lenalidomide (REVLIMID) 25 MG capsule Take 1 pill at nighttime for 21 days on and 7 days off. Patient taking differently: Take 25 mg by mouth See admin instructions. Take 1 pill at  nighttime for 21 days on and 7 days off. 07/21/14  Yes Volanda Napoleon, MD  LORazepam (ATIVAN) 0.5 MG tablet Take 1 tablet (0.5 mg total) by mouth every 6 (six) hours as needed (Nausea or vomiting). 07/30/14  Yes Volanda Napoleon, MD  Multiple Vitamins-Minerals (ALIVE WOMENS ENERGY) TABS Take 1 tablet by mouth daily.   Yes Historical Provider, MD  ondansetron (ZOFRAN) 8 MG tablet Take 1 tablet (8 mg total) by mouth 2 (two) times daily. Start the day after chemo for 2 days. Then take as needed for nausea or vomiting. 07/30/14  Yes Volanda Napoleon, MD  PRESCRIPTION MEDICATION Dr. Marin Olp Comes on Wednesdays for treatment at the Cancer center   Yes Historical Provider, MD  prochlorperazine (COMPAZINE) 10 MG tablet Take 1 tablet (10 mg total) by mouth every 6 (six) hours as needed (Nausea or vomiting). 07/30/14  Yes Volanda Napoleon, MD   BP 109/56 mmHg  Pulse 98  Temp(Src) 101.5 F (38.6 C) (Rectal)  Resp 22  Ht 5\' 4"  (1.626 m)  Wt 137 lb (62.143 kg)  BMI 23.50 kg/m2  SpO2 100% Physical Exam  Constitutional: She is oriented to person, place, and time. She appears well-developed and well-nourished. No distress.  HENT:  Head: Normocephalic and atraumatic.  Mouth/Throat: Oropharynx is clear and moist. No oropharyngeal exudate.  Eyes: Conjunctivae and EOM are normal. Pupils are equal, round, and reactive to light. Right eye exhibits no discharge. Left eye exhibits no discharge.  Neck: Normal range of motion. Neck supple. No tracheal deviation present.  Cardiovascular: Regular rhythm and normal heart sounds.  Tachycardia present.   No murmur heard. Pulses:      Radial pulses are 2+ on the right side, and 2+ on the left side.       Dorsalis pedis pulses are 2+ on the right side, and 2+ on the left side.  Pulmonary/Chest: Effort normal and breath sounds normal. No respiratory distress. She has no wheezes. She has no rales. She exhibits no tenderness.  Patient is able to speak in full sentences without  difficulty  Negative use of accessory muscles Negative stridor Negative pain upon palpation to the chest wall  Abdominal: Soft. Bowel sounds are normal. She exhibits no distension. There is no tenderness. There is no rebound and no guarding.  Musculoskeletal: Normal range of motion. She exhibits no tenderness.  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 Negative swelling, erythema, warmth upon palpation to the left shoulder. Negative drainage. Steri-Strips properly placed.  Lymphadenopathy:    She has no cervical adenopathy.  Neurological: She is alert and oriented to person, place, and time. No cranial nerve deficit. She exhibits normal muscle tone. Coordination normal.  Cranial nerves III-XII grossly intact Strength 5+/5+ to upper and lower extremities bilaterally with resistance applied, equal distribution noted Equal grip strength bilaterally  Negative facial drooping Negative slurred speech  Negative aphasia Patient follows commands well  Patient responds to questions appropriately  Negative arm drift Fine motor skills intact  Skin: Skin is warm and dry. Rash noted. She is not diaphoretic.  Macular rash identified to the lateral aspect of the right upper thigh, left collar, and tip of nose. Negative vesicles identified. Negative pain upon palpation. Negative drainage. Rash is not unilateral.  Psychiatric: She has a normal mood and affect. Her behavior is normal. Thought content normal.  Nursing note and vitals reviewed.   ED Course  Procedures (including critical care time)  Results for orders placed or performed during the hospital encounter of 08/03/14  CBC WITH DIFFERENTIAL  Result Value Ref Range   WBC 6.2 4.0 - 10.5 K/uL   RBC 3.14 (L) 3.87 - 5.11 MIL/uL   Hemoglobin 9.5 (L) 12.0 - 15.0 g/dL   HCT 30.3 (L) 36.0 - 46.0 %   MCV 96.5 78.0 - 100.0 fL   MCH  30.3 26.0 - 34.0 pg   MCHC 31.4 30.0 - 36.0 g/dL   RDW 13.0 11.5 - 15.5 %   Platelets 124 (L) 150 - 400 K/uL   Neutrophils Relative % 93 (H) 43 - 77 %   Neutro Abs 5.8 1.7 - 7.7 K/uL   Lymphocytes Relative 4 (L) 12 - 46 %   Lymphs Abs 0.2 (L) 0.7 - 4.0 K/uL   Monocytes Relative 2 (L) 3 - 12 %   Monocytes Absolute 0.2 0.1 - 1.0 K/uL   Eosinophils Relative 1 0 - 5 %   Eosinophils Absolute 0.1 0.0 - 0.7 K/uL   Basophils Relative 0 0 - 1 %   Basophils Absolute 0.0 0.0 - 0.1 K/uL  Comprehensive metabolic panel  Result Value Ref Range   Sodium 133 (L) 137 - 147 mEq/L   Potassium 3.9 3.7 - 5.3 mEq/L   Chloride 96 96 - 112 mEq/L   CO2 20 19 - 32 mEq/L   Glucose, Bld 94 70 - 99 mg/dL   BUN 9 6 - 23 mg/dL   Creatinine, Ser 0.70 0.50 - 1.10 mg/dL   Calcium 8.3 (L) 8.4 - 10.5 mg/dL   Total Protein 9.5 (H) 6.0 - 8.3 g/dL   Albumin 2.5 (L) 3.5 - 5.2 g/dL   AST 34 0 - 37 U/L   ALT 20 0 - 35 U/L   Alkaline Phosphatase 78 39 - 117 U/L   Total Bilirubin 0.4 0.3 - 1.2 mg/dL   GFR calc non Af Amer >90 >90 mL/min   GFR calc Af Amer >90 >90 mL/min   Anion gap 17 (H) 5 - 15  Urinalysis, Routine w reflex microscopic  Result Value Ref Range   Color, Urine YELLOW YELLOW   APPearance CLEAR CLEAR   Specific Gravity, Urine 1.009 1.005 - 1.030   pH 7.0 5.0 - 8.0   Glucose, UA NEGATIVE NEGATIVE mg/dL   Hgb urine dipstick TRACE (A) NEGATIVE   Bilirubin Urine NEGATIVE NEGATIVE   Ketones, ur NEGATIVE NEGATIVE mg/dL   Protein, ur NEGATIVE NEGATIVE mg/dL   Urobilinogen, UA 1.0 0.0 - 1.0 mg/dL   Nitrite NEGATIVE NEGATIVE   Leukocytes, UA NEGATIVE NEGATIVE  Urine microscopic-add on  Result Value Ref Range   WBC, UA 0-2 <3 WBC/hpf  I-Stat CG4 Lactic Acid, ED  Result Value Ref Range   Lactic Acid, Venous 0.95 0.5 - 2.2 mmol/L    Labs Review Labs Reviewed  CBC WITH DIFFERENTIAL - Abnormal; Notable for the following:    RBC 3.14 (*)    Hemoglobin 9.5 (*)    HCT 30.3 (*)    Platelets 124 (*)  Neutrophils Relative % 93 (*)    Lymphocytes Relative 4 (*)    Lymphs Abs 0.2 (*)    Monocytes Relative 2 (*)    All other components within normal limits  COMPREHENSIVE METABOLIC PANEL - Abnormal; Notable for the following:    Sodium 133 (*)    Calcium 8.3 (*)    Total Protein 9.5 (*)    Albumin 2.5 (*)    Anion gap 17 (*)    All other components within normal limits  URINALYSIS, ROUTINE W REFLEX MICROSCOPIC - Abnormal; Notable for the following:    Hgb urine dipstick TRACE (*)    All other components within normal limits  CULTURE, BLOOD (ROUTINE X 2)  CULTURE, BLOOD (ROUTINE X 2)  URINE CULTURE  URINE MICROSCOPIC-ADD ON  I-STAT CG4 LACTIC ACID, ED  I-STAT CG4 LACTIC ACID, ED    Imaging Review Dg Chest Port 1 View  08/03/2014   CLINICAL DATA:  Recent cancer therapy with fever and cough  EXAM: PORTABLE CHEST - 1 VIEW  COMPARISON:  07/04/2014  FINDINGS: Cardiac shadow is within normal limits. The lungs are well aerated bilaterally. No focal confluent infiltrate is noted. Diffuse interstitial changes are again noted. Postsurgical changes are seen in the proximal left humerus. Lytic lesion is again noted in the proximal left humerus.  IMPRESSION: Chronic interstitial changes without acute abnormality.   Electronically Signed   By: Inez Catalina M.D.   On: 08/03/2014 20:02     EKG Interpretation None       Patient seen and assessed by attending physician, Dr. Doy Mince. Does not believe patient needs to have CT scan of abdomen secondary to benign exam as well for the abdomen.   10:12 PM Spoke with Dr. Marin Olp, Oncologist. Discussed case, labs, imaging, vitals, ED course in great detail. Agreed to admission. Oncology to see patient.  10:26 PM This provider spoke with Dr. Georges Mouse, on-call for [facility]. Discussed case, labs, imaging, vitals, ED course in great detail. Discussed discussion with oncology. Patient to be admitted to stepdown.   MDM   Final diagnoses:  Sepsis, due  to unspecified organism  Myeloma  Immunocompromised    Medications  vancomycin (VANCOCIN) IVPB 1000 mg/200 mL premix (1,000 mg Intravenous New Bag/Given 08/03/14 2206)  sodium chloride 0.9 % bolus 1,000 mL (1,000 mLs Intravenous New Bag/Given 08/03/14 2131)  acetaminophen (TYLENOL) tablet 650 mg (650 mg Oral Given 08/03/14 2050)  piperacillin-tazobactam (ZOSYN) IVPB 3.375 g (0 g Intravenous Stopped 08/03/14 2205)   Filed Vitals:   08/03/14 1957 08/03/14 2207 08/03/14 2207 08/03/14 2220  BP:  109/56    Pulse:  98    Temp: 103.1 F (39.5 C)  99.1 F (37.3 C) 101.5 F (38.6 C)  TempSrc: Rectal  Oral Rectal  Resp:  22    Height:      Weight:      SpO2:  100%     CBC negative elevated leukocytosis-patient does not appear to be neutropenic. Hemoglobin 9.5, hematocrit 30.3. CMP noted elevated anion gap of 17.0 mg/L, glucose 94. Proteins elevated at 9.5. Urinalysis unremarkable-trace of hemoglobin noted-negative findings of nitrates and leukocytes identified. Urine culture and blood cultures 2 are pending. Chest x-ray negative for acute cardiac pulmonary disease. Upon arrival to the ED for temperature identified to be 103.75F with a blood pressure of 117/72. Patient started on IV fluids, Tylenol administered. Patient started on IV antibiotics while in ED setting - vanco and zosyn for unknown cause of fever. Patient is  immunocompromised with chemo and radiation. Discussed plan for admission with patient in great detail agrees to plan of care. Patient to be admitted under the care of [facility], oncology to follow. Patient made it to [facility] for sepsis.    Jamse Mead, PA-C 08/03/14 2228  Artis Delay, MD 08/06/14 [phone]

## 2014-08-03 NOTE — H&P (Addendum)
Samantha Moyer is an 48 y.o. female.    Arlyss Queen (pcp) Burney Gauze (oncologist) Tobie Lords (rheumatologist)  Chief Complaint: fever HPI: 48 yo female with IgA myeloma c/o fever and lightheadedness starting last nite.  Pt notes slight cough (nonproductive).  Denies cp, palp, sob, n/v, diarrhea, dysuria, hematuria.  Pt notes having chemo this past Wed, and radiation on this past Thursday and Friday.  Pt presented to ED due to fever and source is unclear.  Pt admitted for fever and possible sepsis.   Past Medical History  Diagnosis Date  . Scleroderma     Tobie Lords  . IgA myeloma 07/21/2014  . Raynaud disease     Past Surgical History  Procedure Laterality Date  . Nasal septum surgery    . Uterine fibroid embolization      Family History  Problem Relation Age of Onset  . Heart disease Mother   . Diabetes Mother   . Hypertension Mother   . Hyperlipidemia Mother   . Cancer Brother    Social History:  reports that she has never smoked. She has never used smokeless tobacco. She reports that she does not drink alcohol or use illicit drugs.  Allergies:  Allergies  Allergen Reactions  . Codeine Nausea Only  . Tramadol Nausea And Vomiting and Other (See Comments)     (Not in a hospital admission)  Results for orders placed or performed during the hospital encounter of 08/03/14 (from the past 48 hour(s))  Urinalysis, Routine w reflex microscopic     Status: Abnormal   Collection Time: 08/03/14  7:40 PM  Result Value Ref Range   Color, Urine YELLOW YELLOW   APPearance CLEAR CLEAR   Specific Gravity, Urine 1.009 1.005 - 1.030   pH 7.0 5.0 - 8.0   Glucose, UA NEGATIVE NEGATIVE mg/dL   Hgb urine dipstick TRACE (A) NEGATIVE   Bilirubin Urine NEGATIVE NEGATIVE   Ketones, ur NEGATIVE NEGATIVE mg/dL   Protein, ur NEGATIVE NEGATIVE mg/dL   Urobilinogen, UA 1.0 0.0 - 1.0 mg/dL   Nitrite NEGATIVE NEGATIVE   Leukocytes, UA NEGATIVE NEGATIVE  Urine microscopic-add on      Status: None   Collection Time: 08/03/14  7:40 PM  Result Value Ref Range   WBC, UA 0-2 <3 WBC/hpf  CBC WITH DIFFERENTIAL     Status: Abnormal   Collection Time: 08/03/14  8:30 PM  Result Value Ref Range   WBC 6.2 4.0 - 10.5 K/uL   RBC 3.14 (L) 3.87 - 5.11 MIL/uL   Hemoglobin 9.5 (L) 12.0 - 15.0 g/dL   HCT 30.3 (L) 36.0 - 46.0 %   MCV 96.5 78.0 - 100.0 fL   MCH 30.3 26.0 - 34.0 pg   MCHC 31.4 30.0 - 36.0 g/dL   RDW 13.0 11.5 - 15.5 %   Platelets 124 (L) 150 - 400 K/uL   Neutrophils Relative % 93 (H) 43 - 77 %   Neutro Abs 5.8 1.7 - 7.7 K/uL   Lymphocytes Relative 4 (L) 12 - 46 %   Lymphs Abs 0.2 (L) 0.7 - 4.0 K/uL   Monocytes Relative 2 (L) 3 - 12 %   Monocytes Absolute 0.2 0.1 - 1.0 K/uL   Eosinophils Relative 1 0 - 5 %   Eosinophils Absolute 0.1 0.0 - 0.7 K/uL   Basophils Relative 0 0 - 1 %   Basophils Absolute 0.0 0.0 - 0.1 K/uL  Comprehensive metabolic panel     Status: Abnormal   Collection Time:  08/03/14  8:30 PM  Result Value Ref Range   Sodium 133 (L) 137 - 147 mEq/L   Potassium 3.9 3.7 - 5.3 mEq/L   Chloride 96 96 - 112 mEq/L   CO2 20 19 - 32 mEq/L   Glucose, Bld 94 70 - 99 mg/dL   BUN 9 6 - 23 mg/dL   Creatinine, Ser 0.70 0.50 - 1.10 mg/dL   Calcium 8.3 (L) 8.4 - 10.5 mg/dL   Total Protein 9.5 (H) 6.0 - 8.3 g/dL   Albumin 2.5 (L) 3.5 - 5.2 g/dL   AST 34 0 - 37 U/L   ALT 20 0 - 35 U/L   Alkaline Phosphatase 78 39 - 117 U/L   Total Bilirubin 0.4 0.3 - 1.2 mg/dL   GFR calc non Af Amer >90 >90 mL/min   GFR calc Af Amer >90 >90 mL/min    Comment: (NOTE) The eGFR has been calculated using the CKD EPI equation. This calculation has not been validated in all clinical situations. eGFR's persistently <90 mL/min signify possible Chronic Kidney Disease.    Anion gap 17 (H) 5 - 15  I-Stat CG4 Lactic Acid, ED     Status: None   Collection Time: 08/03/14  8:43 PM  Result Value Ref Range   Lactic Acid, Venous 0.95 0.5 - 2.2 mmol/L   Dg Chest Port 1  View  08/03/2014   CLINICAL DATA:  Recent cancer therapy with fever and cough  EXAM: PORTABLE CHEST - 1 VIEW  COMPARISON:  07/04/2014  FINDINGS: Cardiac shadow is within normal limits. The lungs are well aerated bilaterally. No focal confluent infiltrate is noted. Diffuse interstitial changes are again noted. Postsurgical changes are seen in the proximal left humerus. Lytic lesion is again noted in the proximal left humerus.  IMPRESSION: Chronic interstitial changes without acute abnormality.   Electronically Signed   By: Inez Catalina M.D.   On: 08/03/2014 20:02    Review of Systems  Constitutional: Positive for fever. Negative for chills, weight loss, malaise/fatigue and diaphoresis.  HENT: Negative for congestion, ear discharge, ear pain, hearing loss, nosebleeds, sore throat and tinnitus.   Eyes: Negative for blurred vision, double vision, photophobia, pain, discharge and redness.  Respiratory: Positive for cough. Negative for hemoptysis, sputum production, shortness of breath, wheezing and stridor.   Cardiovascular: Negative for chest pain, palpitations, orthopnea, claudication, leg swelling and PND.  Gastrointestinal: Negative for heartburn, nausea, vomiting, abdominal pain, diarrhea, constipation, blood in stool and melena.  Genitourinary: Negative for dysuria, urgency, frequency, hematuria and flank pain.  Musculoskeletal: Negative for myalgias, back pain, joint pain, falls and neck pain.  Skin: Positive for rash. Negative for itching.  Neurological: Negative for dizziness, tingling, tremors, sensory change, speech change, focal weakness, seizures, loss of consciousness, weakness and headaches.  Endo/Heme/Allergies: Negative for environmental allergies and polydipsia. Does not bruise/bleed easily.  Psychiatric/Behavioral: Negative for depression, suicidal ideas, hallucinations and substance abuse. The patient is not nervous/anxious and does not have insomnia.     Blood pressure 109/56,  pulse 98, temperature 101.5 F (38.6 C), temperature source Rectal, resp. rate 22, height 5' 4"  (1.626 m), weight 62.143 kg (137 lb), SpO2 100 %. Physical Exam  Constitutional: She is oriented to person, place, and time. She appears well-developed and well-nourished.  HENT:  Head: Normocephalic and atraumatic.  Eyes: Conjunctivae and EOM are normal. Pupils are equal, round, and reactive to light.  Neck: Normal range of motion. Neck supple. No JVD present. No tracheal deviation present. No thyromegaly  present.  Cardiovascular: Normal rate and regular rhythm.  Exam reveals no gallop and no friction rub.   No murmur heard. Respiratory: Effort normal and breath sounds normal. No respiratory distress. She has no wheezes. She has no rales.  GI: Soft. Bowel sounds are normal. She exhibits no distension. There is no tenderness. There is no rebound and no guarding.  Musculoskeletal: Normal range of motion. She exhibits no edema or tenderness.  Lymphadenopathy:    She has no cervical adenopathy.  Neurological: She is alert and oriented to person, place, and time. She has normal reflexes. She displays normal reflexes. No cranial nerve deficit. She exhibits normal muscle tone. Coordination normal.  Skin: Skin is warm and dry. Rash noted. No erythema. No pallor.  Maculopapular rash over the right proximal distal lower ext, + warmth,  ? Cellulitis   Psychiatric: She has a normal mood and affect. Her behavior is normal. Judgment and thought content normal.  steri strips in place over the left shoulder, radiation marks in place over left upper arm + sclerodactly  Assessment/Plan Fever  ? Cellulitis of the right thigh Blood culture x 2 sets, check esr, crp,  Check cardiac echo Pt received vanco and zosyn in ed, add levaquin due to complaints of cough Flu swab  Anemia/ Myeloma Check cbc in am Appreciate Dr. Dicie Beam input  Thrombocytopenia Check cbc in am  Hyponatremia Hydrate gently with normal  saline  Deran Barro 08/03/2014, 10:38 PM

## 2014-08-04 ENCOUNTER — Ambulatory Visit (HOSPITAL_COMMUNITY)
Admission: RE | Admit: 2014-08-04 | Discharge: 2014-08-04 | Disposition: A | Discharge status: Home / Self Care | Payer: BC Managed Care – PPO | Source: Ambulatory Visit | Attending: Hematology & Oncology | Admitting: Hematology & Oncology

## 2014-08-04 ENCOUNTER — Ambulatory Visit
Admission: RE | Admit: 2014-08-04 | Discharge: 2014-08-04 | Disposition: A | Discharge status: Home / Self Care | Payer: BC Managed Care – PPO | Source: Ambulatory Visit | Attending: Radiation Oncology | Admitting: Radiation Oncology

## 2014-08-04 ENCOUNTER — Ambulatory Visit (HOSPITAL_COMMUNITY): Admission: RE | Admit: 2014-08-04 | Payer: BC Managed Care – PPO | Source: Ambulatory Visit

## 2014-08-04 ENCOUNTER — Encounter (HOSPITAL_COMMUNITY): Payer: Self-pay

## 2014-08-04 ENCOUNTER — Encounter (HOSPITAL_COMMUNITY): Payer: Self-pay | Admitting: Radiology

## 2014-08-04 ENCOUNTER — Inpatient Hospital Stay (HOSPITAL_COMMUNITY): Payer: BC Managed Care – PPO

## 2014-08-04 ENCOUNTER — Telehealth: Payer: Self-pay | Admitting: Oncology

## 2014-08-04 DIAGNOSIS — C9000 Multiple myeloma not having achieved remission: Secondary | ICD-10-CM | POA: Insufficient documentation

## 2014-08-04 DIAGNOSIS — M25512 Pain in left shoulder: Secondary | ICD-10-CM

## 2014-08-04 DIAGNOSIS — R21 Rash and other nonspecific skin eruption: Secondary | ICD-10-CM

## 2014-08-04 DIAGNOSIS — E46 Unspecified protein-calorie malnutrition: Secondary | ICD-10-CM

## 2014-08-04 DIAGNOSIS — D649 Anemia, unspecified: Secondary | ICD-10-CM

## 2014-08-04 LAB — CBC WITH DIFFERENTIAL/PLATELET
Basophils Absolute: 0 10*3/uL (ref 0.0–0.1)
Basophils Relative: 0 % (ref 0–1)
EOS ABS: 0.1 10*3/uL (ref 0.0–0.7)
Eosinophils Relative: 1 % (ref 0–5)
HEMATOCRIT: 29 % — AB (ref 36.0–46.0)
HEMOGLOBIN: 8.9 g/dL — AB (ref 12.0–15.0)
LYMPHS ABS: 0.4 10*3/uL — AB (ref 0.7–4.0)
Lymphocytes Relative: 5 % — ABNORMAL LOW (ref 12–46)
MCH: 29.7 pg (ref 26.0–34.0)
MCHC: 30.7 g/dL (ref 30.0–36.0)
MCV: 96.7 fL (ref 78.0–100.0)
MONO ABS: 0.3 10*3/uL (ref 0.1–1.0)
MONOS PCT: 4 % (ref 3–12)
Neutro Abs: 7 10*3/uL (ref 1.7–7.7)
Neutrophils Relative %: 89 % — ABNORMAL HIGH (ref 43–77)
PLATELETS: 221 10*3/uL (ref 150–400)
RBC: 3 MIL/uL — ABNORMAL LOW (ref 3.87–5.11)
RDW: 13.1 % (ref 11.5–15.5)
WBC: 7.8 10*3/uL (ref 4.0–10.5)

## 2014-08-04 LAB — INFLUENZA PANEL BY PCR (TYPE A & B)
H1N1 flu by pcr: NOT DETECTED
Influenza A By PCR: NEGATIVE
Influenza B By PCR: NEGATIVE

## 2014-08-04 LAB — BONE MARROW EXAM

## 2014-08-04 LAB — COMPREHENSIVE METABOLIC PANEL
ALT: 21 U/L (ref 0–35)
ANION GAP: 14 (ref 5–15)
AST: 28 U/L (ref 0–37)
Albumin: 2.3 g/dL — ABNORMAL LOW (ref 3.5–5.2)
Alkaline Phosphatase: 67 U/L (ref 39–117)
BUN: 7 mg/dL (ref 6–23)
CALCIUM: 7.8 mg/dL — AB (ref 8.4–10.5)
CO2: 22 meq/L (ref 19–32)
Chloride: 103 mEq/L (ref 96–112)
Creatinine, Ser: 0.63 mg/dL (ref 0.50–1.10)
GFR calc Af Amer: >90 mL/min (ref >90)
GLUCOSE: 98 mg/dL (ref 70–99)
Potassium: 3.6 mEq/L — ABNORMAL LOW (ref 3.7–5.3)
SODIUM: 139 meq/L (ref 137–147)
Total Bilirubin: 0.5 mg/dL (ref 0.3–1.2)
Total Protein: 8.5 g/dL — ABNORMAL HIGH (ref 6.0–8.3)

## 2014-08-04 LAB — SEDIMENTATION RATE: Sed Rate: 130 mm/hr — ABNORMAL HIGH (ref 0–22)

## 2014-08-04 LAB — C-REACTIVE PROTEIN: CRP: 4.6 mg/dL — ABNORMAL HIGH (ref <0.60)

## 2014-08-04 MED ORDER — ONDANSETRON HCL 4 MG PO TABS
4.0000 mg | ORAL_TABLET | Freq: Four times a day (QID) | ORAL | Status: DC | PRN
  Administered 2014-08-06: 4 mg via ORAL
  Filled 2014-08-04: qty 1

## 2014-08-04 MED ORDER — FENTANYL CITRATE 0.05 MG/ML IJ SOLN
INTRAMUSCULAR | Status: DC | PRN
  Administered 2014-08-04: 50 ug via INTRAVENOUS

## 2014-08-04 MED ORDER — ACETAMINOPHEN 650 MG RE SUPP: 650.0000 mg | Freq: Four times a day (QID) | RECTAL | Status: DC | PRN

## 2014-08-04 MED ORDER — ENOXAPARIN SODIUM 40 MG/0.4ML ~~LOC~~ SOLN
40.0000 mg | SUBCUTANEOUS | Status: DC
  Administered 2014-08-04 – 2014-08-06 (×3): 40 mg via SUBCUTANEOUS
  Filled 2014-08-04 (×5): qty 0.4

## 2014-08-04 MED ORDER — MIDAZOLAM HCL 2 MG/2ML IJ SOLN
INTRAMUSCULAR | Status: DC | PRN
  Administered 2014-08-04 (×2): 1 mg via INTRAVENOUS

## 2014-08-04 MED ORDER — VANCOMYCIN HCL IN DEXTROSE 750-5 MG/150ML-% IV SOLN
750.0000 mg | Freq: Two times a day (BID) | INTRAVENOUS | Status: DC
  Administered 2014-08-04 – 2014-08-05 (×4): 750 mg via INTRAVENOUS
  Filled 2014-08-04 (×5): qty 150

## 2014-08-04 MED ORDER — FAMCICLOVIR 500 MG PO TABS
500.0000 mg | ORAL_TABLET | Freq: Three times a day (TID) | ORAL | Status: DC
  Administered 2014-08-04 – 2014-08-05 (×6): 500 mg via ORAL
  Filled 2014-08-04 (×10): qty 1

## 2014-08-04 MED ORDER — SODIUM CHLORIDE 0.9 % IJ SOLN
3.0000 mL | Freq: Two times a day (BID) | INTRAMUSCULAR | Status: DC
  Administered 2014-08-04: 3 mL via INTRAVENOUS

## 2014-08-04 MED ORDER — LEVOFLOXACIN IN D5W 500 MG/100ML IV SOLN
500.0000 mg | Freq: Every day | INTRAVENOUS | Status: DC
  Administered 2014-08-04 – 2014-08-05 (×3): 500 mg via INTRAVENOUS
  Filled 2014-08-04 (×4): qty 100

## 2014-08-04 MED ORDER — FAMCICLOVIR 500 MG PO TABS
500.0000 mg | ORAL_TABLET | Freq: Every day | ORAL | Status: DC
  Filled 2014-08-04: qty 1

## 2014-08-04 MED ORDER — PROCHLORPERAZINE MALEATE 10 MG PO TABS
10.0000 mg | ORAL_TABLET | Freq: Four times a day (QID) | ORAL | Status: DC | PRN
Start: 2014-08-04 — End: 2014-08-08
  Filled 2014-08-04: qty 1

## 2014-08-04 MED ORDER — ONDANSETRON HCL 4 MG/2ML IJ SOLN: 4.0000 mg | Freq: Four times a day (QID) | INTRAMUSCULAR | Status: DC | PRN

## 2014-08-04 MED ORDER — FENTANYL CITRATE 0.05 MG/ML IJ SOLN
INTRAMUSCULAR | Status: DC
Start: 2014-08-04 — End: 2014-08-04
  Filled 2014-08-04: qty 4

## 2014-08-04 MED ORDER — ONDANSETRON HCL 8 MG PO TABS: 8.0000 mg | ORAL_TABLET | Freq: Two times a day (BID) | ORAL | Status: DC

## 2014-08-04 MED ORDER — PIPERACILLIN-TAZOBACTAM 3.375 G IVPB
3.3750 g | Freq: Three times a day (TID) | INTRAVENOUS | Status: DC
  Administered 2014-08-04 – 2014-08-05 (×6): 3.375 g via INTRAVENOUS
  Filled 2014-08-04 (×7): qty 50

## 2014-08-04 MED ORDER — MIDAZOLAM HCL 2 MG/2ML IJ SOLN
INTRAMUSCULAR | Status: AC
  Filled 2014-08-04: qty 6

## 2014-08-04 MED ORDER — HYDROMORPHONE HCL 2 MG PO TABS
2.0000 mg | ORAL_TABLET | ORAL | Status: DC | PRN
  Administered 2014-08-04 – 2014-08-08 (×11): 2 mg via ORAL
  Filled 2014-08-04 (×12): qty 1

## 2014-08-04 MED ORDER — ASPIRIN EC 325 MG PO TBEC
325.0000 mg | DELAYED_RELEASE_TABLET | Freq: Every day | ORAL | Status: DC
  Administered 2014-08-04 – 2014-08-08 (×5): 325 mg via ORAL
  Filled 2014-08-04 (×5): qty 1

## 2014-08-04 MED ORDER — ACETAMINOPHEN 325 MG PO TABS
650.0000 mg | ORAL_TABLET | Freq: Four times a day (QID) | ORAL | Status: DC | PRN
  Administered 2014-08-04 (×2): 650 mg via ORAL
  Filled 2014-08-04 (×2): qty 2

## 2014-08-04 MED ORDER — LORAZEPAM 0.5 MG PO TABS
0.5000 mg | ORAL_TABLET | Freq: Four times a day (QID) | ORAL | Status: DC | PRN
  Administered 2014-08-04: 0.5 mg via ORAL
  Filled 2014-08-04: qty 1

## 2014-08-04 MED ORDER — SODIUM CHLORIDE 0.9 % IV SOLN
INTRAVENOUS | Status: AC
  Administered 2014-08-04: 02:00:00 via INTRAVENOUS

## 2014-08-04 NOTE — Procedures (Signed)
Procedure:  CT guided bone marrow biopsy Findings:  Aspirate and core samples obtained of right iliac bone marrow No complications.

## 2014-08-04 NOTE — Progress Notes (Signed)
ANTIBIOTIC CONSULT NOTE - INITIAL  Pharmacy Consult for Vancomycin and Zosyn  Indication: Sepsis  Allergies  Allergen Reactions  . Codeine Nausea Only  . Tramadol Nausea And Vomiting and Other (See Comments)    Patient Measurements: Height: 5' 4.5" (163.8 cm) Weight: 132 lb 3.2 oz (59.966 kg) IBW/kg (Calculated) : 55.85 Adjusted Body Weight:   Vital Signs: Temp: 99.3 F (37.4 C) (12/14 0439) Temp Source: Oral (12/14 0439) BP: 97/54 mmHg (12/14 0439) Pulse Rate: 104 (12/14 0439) Intake/Output from previous day:   Intake/Output from this shift:    Labs:  Recent Labs  08/03/14 2030  WBC 6.2  HGB 9.5*  PLT 124*  CREATININE 0.70   Estimated Creatinine Clearance: 74.2 mL/min (by C-G formula based on Cr of 0.7). No results for input(s): VANCOTROUGH, VANCOPEAK, VANCORANDOM, GENTTROUGH, GENTPEAK, GENTRANDOM, TOBRATROUGH, TOBRAPEAK, TOBRARND, AMIKACINPEAK, AMIKACINTROU, AMIKACIN in the last 72 hours.   Microbiology: No results found for this or any previous visit (from the past 720 hour(s)).  Medical History: Past Medical History  Diagnosis Date  . Scleroderma     Tobie Lords  . IgA myeloma 07/21/2014  . Raynaud disease     Medications:  Anti-infectives    Start     Dose/Rate Route Frequency Ordered Stop   08/04/14 1000  famciclovir [facility]) tablet 500 mg     500 mg Oral Daily 08/04/14 0100     08/04/14 1000  vancomycin (VANCOCIN) IVPB 750 mg/150 ml premix     750 mg150 mL/hr over 60 Minutes Intravenous Every 12 hours 08/04/14 0452     08/04/14 0600  piperacillin-tazobactam (ZOSYN) IVPB 3.375 g     3.375 g12.5 mL/hr over 240 Minutes Intravenous 3 times per day 08/04/14 0452     08/04/14 0115  levofloxacin (LEVAQUIN) IVPB 500 mg     500 mg100 mL/hr over 60 Minutes Intravenous Daily at bedtime 08/04/14 0100     08/03/14 2115  piperacillin-tazobactam (ZOSYN) IVPB 3.375 g     3.375 g100 mL/hr over 30 Minutes Intravenous  Once 08/03/14 2113 08/03/14 2205   08/03/14 2115  vancomycin (VANCOCIN) IVPB 1000 mg/200 mL premix     1,000 mg200 mL/hr over 60 Minutes Intravenous  Once 08/03/14 2113 08/03/14 2357     Assessment: Patient with Fever ? Cellulitis of the right thigh, sepsis.  First dose of antibiotics already given.  Goal of Therapy:  Vancomycin trough level 15-20 mcg/ml Zosyn based on renal function  Plan:  Measure antibiotic drug levels at steady state Follow up culture results Vancomycin 750mg  iv q12hr  Zosyn 3.375g IV Q8H infused over 4hrs.   Tyler Deis, Shea Stakes [place] 08/04/2014,5:01 AM

## 2014-08-04 NOTE — Consult Note (Signed)
[place]  [phone]   Requesting Provider: [facility]  Consulting Provider: Allen Kell  Primary Oncologist: Riley Churches CONSULTATION  NOTE  Reason for Consultation: Multiple Myeloma  HPI: Ms. Ottaway is a 48 year old woman with a history of IgA Multiple Myeloma. She had received her first cycle of chemotherapy with Velcade/Revlimid/Decadron/Zometa on 12/9. Last radiation was given on 12/11 with simulation on 12/10. She was admitted from Urgent Care to ED with elevated fevers up to 103 F and possible sepsis 12/12.  Patient had chills, dyspnea at rest and on exertion with non productive cough, fatigue, dizziness, abdominal pain without constipation or diarrhea, decreased appetite and a right thigh macular rash that began to spread to her left collar and nose. In addition she had known decreased range of motion and pain on the left shoulder following a recent surgery due to myeloma invasion in the region. No bleeding issues reported. Rest of ROS was negative. CXR was negative for acute changes. CBC showed normal WBC, slight anemia and thrombocytopenia due to recent chemo.  CMET was normally abnormal in the setting of myeloma, Urinalysis showed a trace of Hb otherwise unremarkable. Cultures pending. Sed rate and CRP is pending. She was placed on Vanco, Zosyn and Levaquin. Patient was scheduled for a bone marrow biopsy which had to be postponed due to these events that led to admission. She is to continue radiation during the hospitalization. We have been kindly informed of the patient's admission.    Brief Oncological History:  Principle Diagnosis:   IgA lambda myeloma  Current Therapy:   Status post surgical fixation of pathologic left humerus fracture 07/07/14  Radiation therapy to the left humerus with simulation on 12/9 and 1st radiation on 12/10.         Patient started C1D1 Velcade/Revlimid/Decadron/Zometa on  12/9  Past Medical History  Diagnosis Date  . Scleroderma     Tobie Lords  . IgA myeloma 07/21/2014  . Raynaud disease    Past Surgical History  Procedure Laterality Date  . Nasal septum surgery    . Uterine fibroid embolization      Left Humeral fracture ORIF 07/07/14  MEDICATIONS:  Scheduled Meds: . aspirin  325 mg Oral Daily  . enoxaparin (LOVENOX) injection  40 mg Subcutaneous Q24H  . famciclovir  500 mg Oral 3 times per day  . levofloxacin (LEVAQUIN) IV  500 mg Intravenous QHS  . piperacillin-tazobactam (ZOSYN)  IV  3.375 g Intravenous 3 times per day  . sodium chloride  3 mL Intravenous Q12H  . vancomycin  750 mg Intravenous Q12H   Continuous Infusions: . sodium chloride 75 mL/hr at 08/04/14 0157   PRN Meds:.acetaminophen **OR** acetaminophen, HYDROmorphone, LORazepam, ondansetron **OR** ondansetron (ZOFRAN) IV, prochlorperazine  ALLERGIES:  Allergies  Allergen Reactions  . Codeine Nausea Only  . Tramadol Nausea And Vomiting and Other (See Comments)    Family History  Problem Relation Age of Onset  . Heart disease Mother   . Diabetes Mother   . Hypertension Mother   . Hyperlipidemia Mother   . Cancer Brother      Past Surgical History  Procedure Laterality Date  . Nasal septum surgery    . Uterine fibroid embolization      History   Social History  . Marital Status: Divorced    Spouse Name: N/A    Number of Children: 0  . Years of Education: N/A   Occupational History  . education  Social History Main Topics  . Smoking status: Never Smoker   . Smokeless tobacco: Never Used     Comment: NEVER USED TOBACCO  . Alcohol Use: No  . Drug Use: No  . Sexual Activity: Not on file   Other Topics Concern  . Not on file   Social History Narrative     PHYSICAL EXAMINATION:   Filed Vitals:   08/04/14 0439  BP: 97/54  Pulse: 104  Temp: 99.3 F (37.4 C)  Resp: 16   Filed Weights   08/03/14 1928 08/04/14 0100 08/04/14 0351  Weight:  137 lb (62.143 kg) 137 lb 1.6 oz (62.188 kg) 132 lb 3.2 oz (59.6 kg)    48 year-old in no acute distress,conversant, alert and oriented to time, place and date.  General well-developed and thin  HEENT: Normocephalic, atraumatic.Sclera anicteric.Oral cavity without thrush or lesions. Neck:  Supple. No thyromegaly,no cervical or supraclavicular adenopathy  Lungs: Clear to auscultation. No wheezing, rhonchi or rales. Cardiac: Regular rate and rhythm, no murmur,rubs or gallops Abdomen: Soft nontender,bowel sounds x4. Nohepatosplenomegaly Extremities: No clubbing cyanosis or edema. No petechial rash. There is rash noted on the right lower extremity without open vesicles Neuro: No focal or motor deficits  LABORATORY/RADIOLOGY DATA:   Recent Labs Lab 08/03/14 2030 08/04/14 0545  WBC 6.2 7.8  HGB 9.5* 8.9*  HCT 30.3* 29.0*  PLT 124* 221  MCV 96.5 96.7  MCH 30.3 29.7  MCHC 31.4 30.7  RDW 13.0 13.1  LYMPHSABS 0.2* 0.4*  MONOABS 0.2 0.3  EOSABS 0.1 0.1  BASOSABS 0.0 0.0    CMP    Recent Labs Lab 08/03/14 2030 08/04/14 0545  NA 133* 139  K 3.9 3.6*  CL 96 103  CO2 20 22  GLUCOSE 94 98  BUN 9 7  CREATININE 0.70 0.63  CALCIUM 8.3* 7.8*  AST 34 28  ALT 20 21  ALKPHOS 78 67  BILITOT 0.4 0.5        Component Value Date/Time   BILITOT 0.5 08/04/2014 0545   BILITOT 0.40 07/21/2014 0950    Anemia panel:   No results for input(s): VITAMINB12, FOLATE, FERRITIN, TIBC, IRON, RETICCTPCT in the last 72 hours.  No results for input(s): TSH, T4TOTAL, T3FREE, THYROIDAB in the last 72 hours.  Invalid input(s): FREET3      Component Value Date/Time   ESRSEDRATE 130* 08/04/2014 0545    No results for input(s): INR, PROTIME in the last 168 hours.    Urinalysis    Component Value Date/Time   COLORURINE YELLOW 08/03/2014 1940   APPEARANCEUR CLEAR 08/03/2014 1940   LABSPEC 1.009 08/03/2014 1940   PHURINE 7.0 08/03/2014 1940   GLUCOSEU NEGATIVE 08/03/2014 1940    HGBUR TRACE* 08/03/2014 1940   BILIRUBINUR NEGATIVE 08/03/2014 1940   KETONESUR NEGATIVE 08/03/2014 1940   PROTEINUR NEGATIVE 08/03/2014 1940   UROBILINOGEN 1.0 08/03/2014 1940   NITRITE NEGATIVE 08/03/2014 1940   LEUKOCYTESUR NEGATIVE 08/03/2014 1940    Drugs of Abuse  No results found for: LABOPIA, COCAINSCRNUR, LABBENZ, AMPHETMU, THCU, LABBARB   Liver Function Tests:  Recent Labs Lab 08/03/14 2030 08/04/14 0545  AST 34 28  ALT 20 21  ALKPHOS 78 67  BILITOT 0.4 0.5  PROT 9.5* 8.5*  ALBUMIN 2.5* 2.3*   Most recent oncology data:  Blood studies showed an SPEP with 5 monoclonal spikes with a  total of approximately 4 g/L of monoclonal protein.  She had an IgA spike of 3890 milligrams per deciliter.  Her serum  lambda light chain was 461 mg/dL. A 24-hour urine showed 3620 milligrams per day of protein. The vast majority of this was lambda light chain. Her LDH is only 99. Her beta-2 microglobulin is 4.08.  Radiology Studies:  Dg Chest Port 1 View  08/03/2014   CLINICAL DATA:  Recent cancer therapy with fever and cough  EXAM: PORTABLE CHEST - 1 VIEW  COMPARISON:  07/04/2014  FINDINGS: Cardiac shadow is within normal limits. The lungs are well aerated bilaterally. No focal confluent infiltrate is noted. Diffuse interstitial changes are again noted. Postsurgical changes are seen in the proximal left humerus. Lytic lesion is again noted in the proximal left humerus.  IMPRESSION: Chronic interstitial changes without acute abnormality.   Electronically Signed   By: Inez Catalina M.D.   On: 08/03/2014 20:02    Patient: ZINEB, GLADE Collected: 07/10/2014 Client: [facility] Accession: [id number] Received: 07/11/2014 Melrose Nakayama, MD DOB: 1966/08/08 Age: 60 Gender: F Reported: 07/22/2014 [place]. Patient Ph: [phone] MRN #: [id number] [place], [place] [address] Client Acc#: Chart #: [id number] Phone: [phone] Fax: CC: REPORT OF SURGICAL PATHOLOGY FINAL  DIAGNOSIS Diagnosis Bone pathologic fracture, left shoulder pathological fracture - PLASMA CELL NEOPLASM, SEE COMMENT. Microscopic Comment The sections show multiple soft tissue fragments displaying a dense and relatively monomorphic infiltrate of plasmacytoid appearing cells, displaying partially clumped chromatin, and small to inconspicuous nucleoli. This is associated with brisk mitosis. Immunohistochemical stains for LCA, CD20, CD79a, CD3, CD138,kappa, and lambda were performed with appropriate controls. The atypical plasmacytoid cells are positive for LCA, CD138, CD79a (partial) and cytoplasmic lambda. They are negative for CD20 and kappa light chain. CD3 shows background staining and is considered equivocal. The findings are consistent with plasma cell neoplasm which mostly shows plasmablastic morphology. Compete hematologic evaluation is recommended. (BNS:ds 07/21/14) Susanne Greenhouse MD Pathologist, Electronic Signature  ASSESSMENT AND PLAN:  Right thigh rash Etiology unknown to date, rule out cellulitis versus viral (?shingles) Cultures are pending. Flu swab pending Continue antibiotics with Vanco, Zosyn for now  IgA Multiple Myeloma.  She had received her first cycle of chemotherapy with Velcade/Revlimid/Decadron/Zometa on 12/9.  Last radiation was given on 12/11 with simulation on 12/10. She will likely continue with radiation while in the hospital  Anemia Secondary to acute illness/sepsis, IVF, antibiotics and recent chemotherapy No bleeding issues are noted.  No transfusion is indicated at this time. Continue to monitor  Malnutrition Consider Nutrition consult while in hospital  DVT prophylaxis On Lovenox, as thrombocytopenia improved. DC Lovenox if platelets drop to 70k  Full Code  Other medical issues as per admitting team   Robinson, PA-C 08/04/2014, 8:36 AM   ADDENDUM:  I agree with the above note.  I'm not sure as to what is going on with the  temperature. I do worry about the possibility of shingles she has this rash on the right leg. This appears to be in the L4 dermatome.  I agree with the broad-spectrum antibiotic coverage.  She did have her bone marrow biopsy done today. I got a pulmonary report from the pathologist which said that she had about 60% plasma cells.  She has fairly aggressive myeloma. The chromosome studies will show [place] more information about the aggressiveness.  I spoke with radiation oncology. They will continue her radiation to the left arm.  We will have to see how her blood work looks area did she has to have her blood work followed daily.  I would think it would be unusual to have  a fever from her treatments but this is always a possibility.  I very much thank the hospital was for admitting her. I will follow along and help out as much as possible.  Laurey Arrow

## 2014-08-04 NOTE — Progress Notes (Signed)
[facility] Radiation Oncology Dept Therapy Treatment Record Phone [phone]   Radiation Therapy was administered to Samantha Moyer on: 08/04/2014  12:33 PM and was treatment # 3 out of a planned course of 10 treatments.

## 2014-08-04 NOTE — Telephone Encounter (Signed)
Agnes Lawrence, RN for report on Ms. Schoff.  She said that Eliette was gone for a bone biopsy.  Requested a call back when Arelene returned to make sure she was [facility] to go for radiation at 12:15.  Raquel Sarna called back and left a message stating that Rosita would be OK for treatment.

## 2014-08-04 NOTE — H&P (Signed)
Reason for Consult: IgA myeloma Chief Complaint: Chief Complaint  Patient presents with  . Fever  . Rash   Referring Physician(s): Dr. Marin Olp  History of Present Illness: Samantha Moyer is a 48 y.o. female with IgA lambda myeloma scheduled for bone marrow biopsy today. She was admitted from ED 12/13 with c/o fever, chills, and generalized weakness. Sepsis workup being done, oncology is aware of admission and requested bone marrow biopsy be done today. She denies any chest pain, shortness of breath or palpitations. She denies any active signs of bleeding or excessive bruising. The patient denies any history of sleep apnea or chronic oxygen use. She has previously tolerated sedation without complications.    Past Medical History  Diagnosis Date  . Scleroderma     Tobie Lords  . IgA myeloma 07/21/2014  . Raynaud disease     Past Surgical History  Procedure Laterality Date  . Nasal septum surgery    . Uterine fibroid embolization      Allergies: Codeine and Tramadol  Medications: Prior to Admission medications   Medication Sig Start Date End Date Taking? Authorizing Provider  aspirin 325 MG EC tablet Take 325 mg by mouth daily.   Yes Volanda Napoleon, MD  dexamethasone (DECADRON) 4 MG tablet Take 5 pills once a week with food Patient taking differently: Take 20 mg by mouth once a week. Take 5 pills once a week with food 07/21/14  Yes Volanda Napoleon, MD  famciclovir (FAMVIR) 500 MG tablet Take 1 pill a day Patient taking differently: Take 500 mg by mouth daily.  07/21/14  Yes Volanda Napoleon, MD  hyaluronate sodium (RADIAPLEXRX) GEL Apply 1 application topically once.   Yes Historical Provider, MD  HYDROmorphone (DILAUDID) 2 MG tablet Take 1 tablet (2 mg total) by mouth every 4 (four) hours as needed for severe pain. 07/25/14  Yes Volanda Napoleon, MD  lenalidomide (REVLIMID) 25 MG capsule Take 1 pill at nighttime for 21 days on and 7 days off. Patient taking differently:  Take 25 mg by mouth See admin instructions. Take 1 pill at nighttime for 21 days on and 7 days off. 07/21/14  Yes Volanda Napoleon, MD  LORazepam (ATIVAN) 0.5 MG tablet Take 1 tablet (0.5 mg total) by mouth every 6 (six) hours as needed (Nausea or vomiting). 07/30/14  Yes Volanda Napoleon, MD  Multiple Vitamins-Minerals (ALIVE WOMENS ENERGY) TABS Take 1 tablet by mouth daily.   Yes Historical Provider, MD  ondansetron (ZOFRAN) 8 MG tablet Take 1 tablet (8 mg total) by mouth 2 (two) times daily. Start the day after chemo for 2 days. Then take as needed for nausea or vomiting. 07/30/14  Yes Volanda Napoleon, MD  PRESCRIPTION MEDICATION Dr. Marin Olp Comes on Wednesdays for treatment at the Cancer center   Yes Historical Provider, MD  prochlorperazine (COMPAZINE) 10 MG tablet Take 1 tablet (10 mg total) by mouth every 6 (six) hours as needed (Nausea or vomiting). 07/30/14  Yes Volanda Napoleon, MD    Family History  Problem Relation Age of Onset  . Heart disease Mother   . Diabetes Mother   . Hypertension Mother   . Hyperlipidemia Mother   . Cancer Brother     History   Social History  . Marital Status: Divorced    Spouse Name: N/A    Number of Children: 0  . Years of Education: N/A   Occupational History  . education    Social History Main Topics  .  Smoking status: Never Smoker   . Smokeless tobacco: Never Used     Comment: NEVER USED TOBACCO  . Alcohol Use: No  . Drug Use: No  . Sexual Activity: None   Other Topics Concern  . None   Social History Narrative    Review of Systems: A 12 point ROS discussed and pertinent positives are indicated in the HPI above.  All other systems are negative.  Review of Systems  Vital Signs: BP 97/54 mmHg  Pulse 104  Temp(Src) 99.3 F (37.4 C) (Oral)  Resp 16  Ht 5' 4.5" (1.638 m)  Wt 132 lb 3.2 oz (59.966 kg)  BMI 22.35 kg/m2  SpO2 100%  Physical Exam  Constitutional: She is oriented to person, place, and time. No distress.  HENT:    Head: Normocephalic and atraumatic.  Neck: No tracheal deviation present.  Cardiovascular: Exam reveals no gallop and no friction rub.   No murmur heard. Tachycardic  Pulmonary/Chest: Effort normal and breath sounds normal.  Abdominal: Soft. Bowel sounds are normal.  Neurological: She is alert and oriented to person, place, and time.    Imaging: Dg Chest Port 1 View  08/03/2014   CLINICAL DATA:  Recent cancer therapy with fever and cough  EXAM: PORTABLE CHEST - 1 VIEW  COMPARISON:  07/04/2014  FINDINGS: Cardiac shadow is within normal limits. The lungs are well aerated bilaterally. No focal confluent infiltrate is noted. Diffuse interstitial changes are again noted. Postsurgical changes are seen in the proximal left humerus. Lytic lesion is again noted in the proximal left humerus.  IMPRESSION: Chronic interstitial changes without acute abnormality.   Electronically Signed   By: Inez Catalina M.D.   On: 08/03/2014 20:02   Labs:  CBC:  Recent Labs  07/04/14 0938 07/21/14 0950 08/03/14 2030 08/04/14 0545  WBC 8.9 8.6 6.2 7.8  HGB 11.2* 9.7* 9.5* 8.9*  HCT 35.7 32.5* 30.3* 29.0*  PLT 270 408* 124* 221   BMP:  Recent Labs  06/27/14 2025 07/02/14 2249 07/04/14 0938 07/21/14 0950 08/03/14 2030 08/04/14 0545  NA 139 140 143 146* 133* 139  K 4.4 4.2 3.9 5.2* 3.9 3.6*  CL 99 97 97* 99 96 103  CO2 _0 GLUCOSE 86 84 86 96 94 98  BUN _1 CALCIUM 9.7 9.9 10.0 10.0 8.3* 7.8*  CREATININE 0.80 0.61 0.7 0.7 0.70 0.63  GFRNONAA 86 >90  --   --  >90 >90  GFRAA >89 >90  --   --  >90 >90    LIVER FUNCTION TESTS:  Recent Labs  06/27/14 2025 07/02/14 2249 07/04/14 0938 07/21/14 0950 08/03/14 2030 08/04/14 0545  BILITOT 0.2 0.3 0.50 0.40 0.4 0.5  AST _2 34 28  ALT _3 ALKPHOS 81 85 59 64 78 67  PROT 9.7*  9.7* 9.6* 9.8* 10.2* 9.5* 8.5*  ALBUMIN 3.1* 2.8*  --   --  2.5* 2.3*    Assessment and Plan: IgA  Myeloma Anemia Scheduled for outpatient bone marrow biopsy today ED 12/13 with fever, chills, possible sepsis-workup being done Oncology requested to move forward with biopsy today.  Patient has been NPO, labs reviewed Risks and Benefits discussed with the patient. All of the patient's questions were answered, patient is agreeable to proceed. Consent signed and in chart.   Thank you for this interesting consult.  I greatly enjoyed meeting Samantha Moyer and look forward  to participating in their care.    I spent a total of 30 minutes face to face in clinical consultation, greater than 50% of which was counseling/coordinating care  Signed: Hedy Jacob 08/04/2014, 9:16 AM

## 2014-08-04 NOTE — Progress Notes (Signed)
[facility] PROGRESS NOTE  Samantha Moyer PTW:[id number] DOB: 06-15-1966 DOA: 08/03/2014 PCP: Jenny Reichmann, MD  Assessment/Plan: 1. Fever 1. Tmax of 101.5 at 2220 on 12/13, afebrile since then 2. No leukocytosis 3. Not entirely clear source. Pt did have erythematous patches over R LE, improved overnight with abx. On exam, L shoulder is markedly tender to minimal palpation, s/p rod insertion several weeks ago. Consider imaging to post-surgical shoulder to r/o infection 2. Anemia secondary to Myeloma 1. Stable 2. Monitor and cont to transfuse as needed 3. Thrombocytopenia 1. Improved overnight 2. Plts of 221 today 4. Hyponatremia 1. Resolved 2. Cont to monitor 5. Shoulder pain 1. Per above, pt is s/p recent surgery to L shoulder 2. Would image shoulder to r/o acute infectious source of above fever 6. DVT prophylaxis 1. Lovenox  Code Status: Full Family Communication: Pt in room (indicate person spoken with, relationship, and if by phone, the number) Disposition Plan: Pending   Consultants:  Oncology  Radiology  Procedures:    Antibiotics:  Zosyn 12/13>>>  Vancomycin 12/13>>>  HPI/Subjective: No acute events overnight. Pt reports erythema to R leg seems better. Complaining of marked L shoulder pain with minimal palpation  Objective: Filed Vitals:   08/04/14 0100 08/04/14 0351 08/04/14 0439 08/04/14 1256  BP: 103/67  97/54 94/61  Pulse: 87  104 99  Temp: 97.9 F (36.6 C)  99.3 F (37.4 C) 98.7 F (37.1 C)  TempSrc: Oral  Oral Oral  Resp: _0 Height: 5' 4.5" (1.638 m)     Weight: 62.188 kg (137 lb 1.6 oz) 59.966 kg (132 lb 3.2 oz)    SpO2: 99%  100% 100%   No intake or output data in the 24 hours ending 08/04/14 1442 Filed Weights   08/03/14 1928 08/04/14 0100 08/04/14 0351  Weight: 62.143 kg (137 lb) 62.188 kg (137 lb 1.6 oz) 59.966 kg (132 lb 3.2 oz)    Exam:   General:  Awake, in nad  Cardiovascular: regular, s1,  s2  Respiratory: normal resp effort, no wheezing  Abdomen: soft,nondistended  Musculoskeletal: perfused, no clubbing, L shoulder with post-op incesion w/o surrounding erythema or drainage, dressings in place, quite tender to minimal palpation   Data Reviewed: Basic Metabolic Panel:  Recent Labs Lab 08/03/14 2030 08/04/14 0545  NA 133* 139  K 3.9 3.6*  CL 96 103  CO2 20 22  GLUCOSE 94 98  BUN 9 7  CREATININE 0.70 0.63  CALCIUM 8.3* 7.8*   Liver Function Tests:  Recent Labs Lab 08/03/14 2030 08/04/14 0545  AST 34 28  ALT 20 21  ALKPHOS 78 67  BILITOT 0.4 0.5  PROT 9.5* 8.5*  ALBUMIN 2.5* 2.3*   No results for input(s): LIPASE, AMYLASE in the last 168 hours. No results for input(s): AMMONIA in the last 168 hours. CBC:  Recent Labs Lab 08/03/14 2030 08/04/14 0545  WBC 6.2 7.8  NEUTROABS 5.8 7.0  HGB 9.5* 8.9*  HCT 30.3* 29.0*  MCV 96.5 96.7  PLT 124* 221   Cardiac Enzymes: No results for input(s): CKTOTAL, CKMB, CKMBINDEX, TROPONINI in the last 168 hours. BNP (last 3 results) No results for input(s): PROBNP in the last 8760 hours. CBG: No results for input(s): GLUCAP in the last 168 hours.  No results found for this or any previous visit (from the past 240 hour(s)).   Studies: Ct Biopsy  08/04/2014   CLINICAL DATA:  IgA myeloma. The patient requires bone marrow biopsy.  EXAM:  CT GUIDED BIOPSY OF RIGHT ILIAC BONE MARROW  ANESTHESIA/SEDATION: Versed 2.0 mg IV, Fentanyl 100 mcg IV  Total Moderate Sedation Time  12 minutes.  PROCEDURE: The procedure risks, benefits, and alternatives were explained to the patient. Questions regarding the procedure were encouraged and answered. The patient understands and consents to the procedure.  The right gluteal region was prepped with Betadine. Sterile gown and sterile gloves were used for the procedure. Local anesthesia was provided with 1% Lidocaine.  Under CT guidance, an 11 gauge OnControl bone cutting needle was  advanced from a posterior approach into the right iliac bone. Needle positioning was confirmed with CT. Initial non heparinized and heparinized aspirate samples were obtained of bone marrow.  Core biopsy was performed wit through the outer needle.  COMPLICATIONS: None  FINDINGS: Inspection of initial aspirate did reveal visible particles. An intact core biopsy sample was obtained.  IMPRESSION: CT guided bone marrow biopsy of right posterior iliac bone with both aspirate and core samples obtained.   Electronically Signed   By: [name] M.D.   On: 08/04/2014 13:07   Dg Chest Port 1 View  08/03/2014   CLINICAL DATA:  Recent cancer therapy with fever and cough  EXAM: PORTABLE CHEST - 1 VIEW  COMPARISON:  07/04/2014  FINDINGS: Cardiac shadow is within normal limits. The lungs are well aerated bilaterally. No focal confluent infiltrate is noted. Diffuse interstitial changes are again noted. Postsurgical changes are seen in the proximal left humerus. Lytic lesion is again noted in the proximal left humerus.  IMPRESSION: Chronic interstitial changes without acute abnormality.   Electronically Signed   By: Inez Catalina M.D.   On: 08/03/2014 20:02    Scheduled Meds: . aspirin  325 mg Oral Daily  . enoxaparin (LOVENOX) injection  40 mg Subcutaneous Q24H  . famciclovir  500 mg Oral 3 times per day  . levofloxacin (LEVAQUIN) IV  500 mg Intravenous QHS  . midazolam      . piperacillin-tazobactam (ZOSYN)  IV  3.375 g Intravenous 3 times per day  . sodium chloride  3 mL Intravenous Q12H  . vancomycin  750 mg Intravenous Q12H   Continuous Infusions:   Active Problems:   Sepsis   Fever  Time spent: 73mn   Danijah Noh, [facility] Pager [phone] If 7PM-7AM, please contact night-coverage at [email or web address], password [email or web address]12/14/2015, 2:42 PM  LOS: 1 day

## 2014-08-05 ENCOUNTER — Ambulatory Visit
Admission: RE | Admit: 2014-08-05 | Discharge: 2014-08-05 | Disposition: A | Discharge status: Home / Self Care | Payer: BC Managed Care – PPO | Source: Ambulatory Visit | Attending: Radiation Oncology | Admitting: Radiation Oncology

## 2014-08-05 ENCOUNTER — Inpatient Hospital Stay (HOSPITAL_COMMUNITY): Payer: BC Managed Care – PPO

## 2014-08-05 DIAGNOSIS — D696 Thrombocytopenia, unspecified: Secondary | ICD-10-CM

## 2014-08-05 DIAGNOSIS — M25551 Pain in right hip: Secondary | ICD-10-CM

## 2014-08-05 DIAGNOSIS — M79602 Pain in left arm: Secondary | ICD-10-CM

## 2014-08-05 DIAGNOSIS — R6883 Chills (without fever): Secondary | ICD-10-CM | POA: Insufficient documentation

## 2014-08-05 DIAGNOSIS — C9000 Multiple myeloma not having achieved remission: Secondary | ICD-10-CM | POA: Insufficient documentation

## 2014-08-05 DIAGNOSIS — I369 Nonrheumatic tricuspid valve disorder, unspecified: Secondary | ICD-10-CM

## 2014-08-05 LAB — CBC
HCT: 28.7 % — ABNORMAL LOW (ref 36.0–46.0)
HEMOGLOBIN: 8.8 g/dL — AB (ref 12.0–15.0)
MCH: 29.7 pg (ref 26.0–34.0)
MCHC: 30.7 g/dL (ref 30.0–36.0)
MCV: 97 fL (ref 78.0–100.0)
Platelets: 176 10*3/uL (ref 150–400)
RBC: 2.96 MIL/uL — ABNORMAL LOW (ref 3.87–5.11)
RDW: 13.4 % (ref 11.5–15.5)
WBC: 5.5 10*3/uL (ref 4.0–10.5)

## 2014-08-05 LAB — COMPREHENSIVE METABOLIC PANEL
ALBUMIN: 2.1 g/dL — AB (ref 3.5–5.2)
ALK PHOS: 64 U/L (ref 39–117)
ALT: 25 U/L (ref 0–35)
AST: 54 U/L — ABNORMAL HIGH (ref 0–37)
Anion gap: 15 (ref 5–15)
BUN: 7 mg/dL (ref 6–23)
CHLORIDE: 99 meq/L (ref 96–112)
CO2: 22 meq/L (ref 19–32)
Calcium: 7.5 mg/dL — ABNORMAL LOW (ref 8.4–10.5)
Creatinine, Ser: 0.75 mg/dL (ref 0.50–1.10)
GFR calc Af Amer: >90 mL/min (ref >90)
GFR calc non Af Amer: >90 mL/min (ref >90)
Glucose, Bld: 100 mg/dL — ABNORMAL HIGH (ref 70–99)
POTASSIUM: 3.3 meq/L — AB (ref 3.7–5.3)
Sodium: 136 mEq/L — ABNORMAL LOW (ref 137–147)
Total Bilirubin: 0.4 mg/dL (ref 0.3–1.2)
Total Protein: 7.9 g/dL (ref 6.0–8.3)

## 2014-08-05 LAB — URINE CULTURE
Colony Count: NO GROWTH
Culture: NO GROWTH

## 2014-08-05 LAB — LACTATE DEHYDROGENASE: LDH: 372 U/L — ABNORMAL HIGH (ref 94–250)

## 2014-08-05 MED ORDER — POLYETHYLENE GLYCOL 3350 17 G PO PACK
17.0000 g | PACK | Freq: Once | ORAL | Status: AC
  Administered 2014-08-05: 17 g via ORAL
  Filled 2014-08-05: qty 1

## 2014-08-05 MED ORDER — RADIAPLEXRX EX GEL
Freq: Once | CUTANEOUS | Status: AC
  Administered 2014-08-05: 17:00:00 via TOPICAL
  Filled 2014-08-05: qty 170

## 2014-08-05 MED ORDER — POTASSIUM CHLORIDE CRYS ER 20 MEQ PO TBCR
20.0000 meq | EXTENDED_RELEASE_TABLET | Freq: Once | ORAL | Status: AC
  Administered 2014-08-05: 20 meq via ORAL
  Filled 2014-08-05 (×2): qty 1

## 2014-08-05 MED ORDER — DOCUSATE SODIUM 100 MG PO CAPS
100.0000 mg | ORAL_CAPSULE | Freq: Two times a day (BID) | ORAL | Status: DC
  Administered 2014-08-05 – 2014-08-06 (×4): 100 mg via ORAL
  Filled 2014-08-05 (×9): qty 1

## 2014-08-05 MED ORDER — POTASSIUM CHLORIDE CRYS ER 20 MEQ PO TBCR: 20.0000 meq | EXTENDED_RELEASE_TABLET | Freq: Once | ORAL | Status: DC

## 2014-08-05 MED ORDER — RADIAPLEXRX EX GEL
Freq: Once | CUTANEOUS | Status: AC
  Administered 2014-08-05: 16:00:00 via TOPICAL

## 2014-08-05 NOTE — Progress Notes (Signed)
  Echocardiogram 2D Echocardiogram has been performed.  Isac Lincks FRANCES 08/05/2014, 2:32 PM

## 2014-08-05 NOTE — Progress Notes (Signed)
Pt was sitting up in bed when I arrived. She wanted to talk about her diagnosis and family relationships. She said her family is in [place] and she had not told them she is presently in the hospital. Pt said it was more stressful for her b/c they would be here crying, etc. We talked about times that she would know when to tell her family and would want and need their support. She said she would tell them when she goes home. Pt said she has friends who are close by and supportive. Pt asked for me to pray for her and her decisions and family. Had prayer w/pt. She was very thankful for visit and prayer. Ernest Haber Chaplain   08/05/14 1100  Clinical Encounter Type  Visited With Patient

## 2014-08-05 NOTE — Progress Notes (Signed)
  Radiation Oncology         [phone] ________________________________  Name: Samantha Moyer MRN: [id number]  Date: 08/05/2014  DOB: 10/07/1965  Weekly Radiation Therapy Management   DIAGNOSIS: Multiple myeloma involving the left humerus   Current Dose: 12 Gy     Planned Dose:  30 Gy  Narrative . . . . . . . . The patient presents for routine under treatment assessment.                                   The patient is without complaint. She is feeling better is being admitted to the hospital for management of her fever. Patient developed a fever 103 as outpatient. She is on Zosyn and vancomycin. She continues to have pain in the left arm and shoulder.                                 Set-up films were reviewed.                                 The chart was checked. Physical Findings. . .    The lungs are clear. The heart has a regular rhythm and rate. Patient is lying comfortably in her hospital bed. Impression . . . . . . . The patient is tolerating radiation. Plan . . . . . . . . . . . . Continue treatment as planned. She has completed 4 of 10 planned treatments.  ________________________________   Blair Promise, PhD, MD

## 2014-08-05 NOTE — Progress Notes (Signed)
Samantha Moyer   DOB:October 28, 1965   PQ#:[id number]   ENM#:[id number]  Patient Care Team: Darlyne Russian, MD as PCP - General (Family Medicine)  Subjective: Events since 08/04/14 noted. Afebrile. Main complaint is left shoulder pain, with no apparent source of infection to explain it per MRI, controlled with current meds.  Rash on the RLE improving. No bleeding issues.No other events overnight.  Scheduled Meds: . aspirin  325 mg Oral Daily  . docusate sodium  100 mg Oral BID  . enoxaparin (LOVENOX) injection  40 mg Subcutaneous Q24H  . famciclovir  500 mg Oral 3 times per day  . levofloxacin (LEVAQUIN) IV  500 mg Intravenous QHS  . piperacillin-tazobactam (ZOSYN)  IV  3.375 g Intravenous 3 times per day  . polyethylene glycol  17 g Oral Once  . sodium chloride  3 mL Intravenous Q12H  . vancomycin  750 mg Intravenous Q12H   Continuous Infusions:  PRN Meds:acetaminophen **OR** acetaminophen, HYDROmorphone, LORazepam, ondansetron **OR** ondansetron (ZOFRAN) IV, prochlorperazine   Objective:  Filed Vitals:   08/05/14 0456  BP: 92/57  Pulse: 90  Temp: 97.7 F (36.5 C)  Resp: 22      Intake/Output Summary (Last 24 hours) at 08/05/14 0945 Last data filed at 08/05/14 0515  Gross per 24 hour  Intake   1660 ml  Output      0 ml  Net   1660 ml    ECOG PERFORMANCE STATUS:  48 year-old in no acute distress,conversant, alert and oriented to time, place and date.  General well-developed and thin  HEENT: Normocephalic, atraumatic.Sclera anicteric.Oral cavity without thrush or lesions. Neck: Supple. No thyromegaly,no cervical or supraclavicular adenopathy  Lungs: Clear to auscultation. No wheezing, rhonchi or rales. Cardiac: Regular rate and rhythm, no murmur,rubs or gallops Abdomen: Soft nontender,bowel sounds x4. Nohepatosplenomegaly Extremities: No clubbing cyanosis or edema. No petechial rash. Improved right lower extremity rash. Left shoulder with incisional tenderness. Neuro: No  focal or motor deficits    CBG (last 3)  No results for input(s): GLUCAP in the last 72 hours.   Labs:   Recent Labs Lab 08/03/14 2030 08/04/14 0545 08/05/14 0455  WBC 6.2 7.8 5.5  HGB 9.5* 8.9* 8.8*  HCT 30.3* 29.0* 28.7*  PLT 124* 221 176  MCV 96.5 96.7 97.0  MCH 30.3 29.7 29.7  MCHC 31.4 30.7 30.7  RDW 13.0 13.1 13.4  LYMPHSABS 0.2* 0.4*  --   MONOABS 0.2 0.3  --   EOSABS 0.1 0.1  --   BASOSABS 0.0 0.0  --      Chemistries:    Recent Labs Lab 08/03/14 2030 08/04/14 0545 08/05/14 0455  NA 133* 139 136*  K 3.9 3.6* 3.3*  CL 96 103 99  CO2 20 22 22   GLUCOSE 94 98 100*  BUN 9 7 7   CREATININE 0.70 0.63 0.75  CALCIUM 8.3* 7.8* 7.5*  AST 34 28 54*  ALT 20 21 25   ALKPHOS 78 67 64  BILITOT 0.4 0.5 0.4    GFR Estimated Creatinine Clearance: 74.2 mL/min (by C-G formula based on Cr of 0.75).  Liver Function Tests:  Recent Labs Lab 08/03/14 2030 08/04/14 0545 08/05/14 0455  AST 34 28 54*  ALT 20 21 25   ALKPHOS 78 67 64  BILITOT 0.4 0.5 0.4  PROT 9.5* 8.5* 7.9  ALBUMIN 2.5* 2.3* 2.1*    Urine Studies     Component Value Date/Time   COLORURINE YELLOW 08/03/2014 [place] 08/03/2014 1940  LABSPEC 1.009 08/03/2014 1940   PHURINE 7.0 08/03/2014 1940   GLUCOSEU NEGATIVE 08/03/2014 1940   HGBUR TRACE* 08/03/2014 1940   BILIRUBINUR NEGATIVE 08/03/2014 1940   KETONESUR NEGATIVE 08/03/2014 1940   PROTEINUR NEGATIVE 08/03/2014 1940   UROBILINOGEN 1.0 08/03/2014 1940   NITRITE NEGATIVE 08/03/2014 1940   LEUKOCYTESUR NEGATIVE 08/03/2014 1940    Microbiology Cultures negative to date    Imaging Studies:  Mr Shoulder Left Wo Contrast  08/05/2014   CLINICAL DATA:  Left shoulder mass on previous MRI. Subsequent humeral fixation and fever. Evaluate for source of infection. Initial encounter.  EXAM: MRI OF THE LEFT SHOULDER WITHOUT CONTRAST  TECHNIQUE: Multiplanar, multisequence MR imaging of the shoulder was performed. No intravenous  contrast was administered.  COMPARISON:  Radiographs 06/27/2014 and MRI 07/03/2014.  FINDINGS: Protocol was altered due to the previous surgery and indication. Patient has undergone interval left humeral intramedullary nail fixation. The hardware creates moderate susceptibility artifact. 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 The primary component measures approximately 2.4 x 3.4 x 4.9 cm, although are additional lobulated components extending more inferiorly and posteriorly, best seen on the sagittal images. No gross changes are identified, although the surrounding cortex is largely destroyed with a probable underlying pathologic fracture.  Rotator cuff: Intact with stable mild supraspinatus and infraspinous tendinosis.  Muscles: There is mild generalized T2 hyperintensity within the muscles surrounding the shoulder, greatest within the teres major muscle.  Biceps long head:  Intact.  Acromioclavicular Joint: Stable mild acromioclavicular degenerative changes. No significant fluid in the subacromial -subdeltoid bursa.  Glenohumeral Joint: No significant joint effusion or glenohumeral arthropathy. No focal periarticular fluid collections are identified.  Labrum:  Stable without gross abnormality.  Bones: As above, large lytic lesion involving the proximal humerus status post intramedullary nail fixation. No definite lesions are demonstrated within the visualized scapula or clavicle.  Other: Prominent axillary lymph nodes are similar to the prior examination.  IMPRESSION: 1. Interval left humeral intramedullary nail fixation for pathologic fracture. The large lytic lesion involving the proximal left humerus is grossly unchanged. 2. No explanation for fever identified. There is nonspecific edema throughout the musculature surrounding the shoulder, but no focal fluid collection. 3. Stable left axillary adenopathy.   Electronically Signed   By: Camie Patience M.D.   On: 08/05/2014 08:20   Ct Biopsy  08/04/2014   CLINICAL DATA:  IgA myeloma. The patient requires bone marrow biopsy.  EXAM: CT GUIDED BIOPSY OF RIGHT ILIAC BONE MARROW  ANESTHESIA/SEDATION: Versed 2.0 mg IV, Fentanyl 100 mcg IV  Total Moderate Sedation Time  12 minutes.  PROCEDURE: The procedure risks, benefits, and alternatives were explained to the patient. Questions regarding the procedure were encouraged and answered. The patient understands and consents to the procedure.  The right gluteal region was prepped with Betadine. Sterile gown and sterile gloves were used for the procedure. Local anesthesia was provided with 1% Lidocaine.  Under CT guidance, an 11 gauge OnControl bone cutting needle was advanced from a posterior approach into the right iliac bone. Needle positioning was confirmed with CT. Initial non heparinized and heparinized aspirate samples were obtained of bone marrow.  Core biopsy was performed wit through the outer needle.  COMPLICATIONS: None  FINDINGS: Inspection of initial aspirate did reveal visible particles. An intact core biopsy sample was obtained.  IMPRESSION: CT guided bone marrow biopsy of right posterior iliac bone with both aspirate and core samples obtained.  Electronically Signed   By: [name] M.D.   On: 08/04/2014 13:07   Dg Chest Port 1 View  08/03/2014   CLINICAL DATA:  Recent cancer therapy with fever and cough  EXAM: PORTABLE CHEST - 1 VIEW  COMPARISON:  07/04/2014  FINDINGS: Cardiac shadow is within normal limits. The lungs are well aerated bilaterally. No focal confluent infiltrate is noted. Diffuse interstitial changes are again noted. Postsurgical changes are seen in the proximal left humerus. Lytic lesion is again noted in the proximal left humerus.  IMPRESSION: Chronic interstitial changes without acute abnormality.   Electronically Signed   By: Inez Catalina M.D.   On: 08/03/2014 20:02    Assessment/Plan: 48 y.o.  Right thigh  rash Etiology unknown to date, rule out cellulitis versus viral (?shingles) Cultures are pending. Flu swab pending Continue antibiotics with Vanco, Zosyn, as well as famiclovir.   IgA Multiple Myeloma.  She had received her first cycle of chemotherapy with Velcade/Revlimid/Decadron/Zometa on 12/9.  S/p Bone right iliac marrow biopsy on 08/04/14, formal results pending. Preliminary consistent with 60 percent plasma cells. MRI of the left shoulder on 08/05/14 shows the interval left humeral intramedullary nail fixation for pathologic fracture. The large lytic lesion involving the proximal left humerus is grossly unchanged. There is nonspecific edema throughout the musculature surrounding the shoulder, but no focal fluid collection or explanation for her fever. Stable left axillary adenopathy. Agree with continuing with radiation therapy to the left humerus, s/p treatment #3 of 10, for #4 today.  Anemia Secondary to acute illness/sepsis, IVF, antibiotics and recent chemotherapy No transfusion is indicated at this time. Continue to monitor  Malnutrition Consider Nutrition consult while in hospital  DVT prophylaxis On Lovenox, as thrombocytopenia improved. DC Lovenox if platelets drop to 70k  Full Code  Other medical issues as per admitting team     **Disclaimer: This note was dictated with voice recognition software. Similar sounding words can inadvertently be transcribed and this note may contain transcription errors which may not have been corrected upon publication of note.** WERTMAN,SARA E, PA-C 08/05/2014  9:45 AM   ADDENDUM:  I agree with the above.  Laurey Arrow

## 2014-08-05 NOTE — Progress Notes (Addendum)
[facility] PROGRESS NOTE  Edie Vallandingham PTW:[id number] DOB: 1966/03/08 DOA: 08/03/2014 PCP: Jenny Reichmann, MD   Off Service Summary: [phone] with a hx of actively treated myeloma who presents with SIRS, likely sepsis from RLE cellulitis. The patient has been continued on broad IV abx with gradual improvement and has remained essentially afebrile, although she did report sweats with borderline temps overnight. Oncology is following and assisting in management with recs to continue current IV abx and cont active radiation tx for her myeloma.  Assessment/Plan: 1. Fever 1. Tmax of 101.5 at 2220 on 12/13, has remained afebrile since then 2. No leukocytosis 3. Not entirely clear source. Pt did have erythematous patches over R LE that is continuing to improve with abx. On exam, L shoulder remains markedly tender to minimal palpation s/p rod insertion several weeks ago. MRI of L shoulder without infectious source 2. Anemia secondary to Myeloma 1. Stable 2. Monitor and cont to transfuse as needed 3. Oncology following 3. Thrombocytopenia 1. Improved overnight 2. Most recent plts of 221 4. Hyponatremia 1. Resolved 2. Cont to monitor 5. Shoulder pain 1. Per above, pt is s/p recent surgery to L shoulder 2. S/p MRI of L shoulder without source of infection, lytic lesion appears stable and unchanged 3. Cont with radiation tx as tolerated per Onc recs 6. DVT prophylaxis 1. Lovenox while inpt Code Status: Full Family Communication: Pt in room  Disposition Plan: Pending   Consultants:  Oncology  Radiology  Procedures:    Antibiotics:  Zosyn 12/13>>>  Vancomycin 12/13>>>  HPI/Subjective: Feels somewhat better today. Complains of cont L shoulder pain. Noted sweats and low grade temps overnight.  Objective: Filed Vitals:   08/04/14 1256 08/04/14 2046 08/05/14 0100 08/05/14 0456  BP: 94/61 100/62  92/57  Pulse: 99 96  90  Temp: 98.7 F (37.1 C) 99.5 F (37.5 C) 98.3 F  (36.8 C) 97.7 F (36.5 C)  TempSrc: Oral Oral  Oral  Resp: 22 24  22   Height:      Weight:    59.1 kg (130 lb 4.7 oz)  SpO2: 100% 100%  100%    Intake/Output Summary (Last 24 hours) at 08/05/14 1325 Last data filed at 08/05/14 0515  Gross per 24 hour  Intake   1660 ml  Output      0 ml  Net   1660 ml   Filed Weights   08/04/14 0100 08/04/14 0351 08/05/14 0456  Weight: 62.188 kg (137 lb 1.6 oz) 59.966 kg (132 lb 3.2 oz) 59.1 kg (130 lb 4.7 oz)    Exam:   General:  Awake, in nad  Cardiovascular: regular, s1, s2  Respiratory: normal resp effort, no wheezing  Abdomen: soft,nondistended  Musculoskeletal: perfused, no clubbing, L shoulder with post-op incesion w/o surrounding erythema or drainage, dressings in place, quite tender to minimal palpation   Data Reviewed: Basic Metabolic Panel:  Recent Labs Lab 08/03/14 2030 08/04/14 0545 08/05/14 0455  NA 133* 139 136*  K 3.9 3.6* 3.3*  CL 96 103 99  CO2 20 22 22   GLUCOSE 94 98 100*  BUN 9 7 7   CREATININE 0.70 0.63 0.75  CALCIUM 8.3* 7.8* 7.5*   Liver Function Tests:  Recent Labs Lab 08/03/14 2030 08/04/14 0545 08/05/14 0455  AST 34 28 54*  ALT 20 21 25   ALKPHOS 78 67 64  BILITOT 0.4 0.5 0.4  PROT 9.5* 8.5* 7.9  ALBUMIN 2.5* 2.3* 2.1*   No results for input(s): LIPASE, AMYLASE in the  last 168 hours. No results for input(s): AMMONIA in the last 168 hours. CBC:  Recent Labs Lab 08/03/14 2030 08/04/14 0545 08/05/14 0455  WBC 6.2 7.8 5.5  NEUTROABS 5.8 7.0  --   HGB 9.5* 8.9* 8.8*  HCT 30.3* 29.0* 28.7*  MCV 96.5 96.7 97.0  PLT 124* 221 176   Cardiac Enzymes: No results for input(s): CKTOTAL, CKMB, CKMBINDEX, TROPONINI in the last 168 hours. BNP (last 3 results) No results for input(s): PROBNP in the last 8760 hours. CBG: No results for input(s): GLUCAP in the last 168 hours.  Recent Results (from the past 240 hour(s))  Urine culture     Status: None   Collection Time: 08/03/14  7:40 PM   Result Value Ref Range Status   Specimen Description URINE, CLEAN CATCH  Final   Special Requests NONE  Final   Culture  Setup Time   Final    08/04/2014 03:28 Performed at [place] Performed at [facility]   Final   Culture NO GROWTH Performed at [facility]   Final   Report Status 08/05/2014 FINAL  Final  Blood Culture (routine x 2)     Status: None (Preliminary result)   Collection Time: 08/03/14  8:15 PM  Result Value Ref Range Status   Specimen Description BLOOD RIGHT ANTECUBITAL  Final   Special Requests BOTTLES DRAWN AEROBIC AND ANAEROBIC 5CC  Final   Culture  Setup Time   Final    08/04/2014 03:11 Performed at [facility]    Culture   Final           BLOOD CULTURE RECEIVED NO GROWTH TO DATE CULTURE WILL BE HELD FOR 5 DAYS BEFORE ISSUING A FINAL NEGATIVE REPORT Performed at [facility]    Report Status PENDING  Incomplete  Blood Culture (routine x 2)     Status: None (Preliminary result)   Collection Time: 08/03/14  8:50 PM  Result Value Ref Range Status   Specimen Description BLOOD R FA  Final   Special Requests BOTTLES DRAWN AEROBIC AND ANAEROBIC 5CC  Final   Culture  Setup Time   Final    08/04/2014 03:11 Performed at [facility]    Culture   Final           BLOOD CULTURE RECEIVED NO GROWTH TO DATE CULTURE WILL BE HELD FOR 5 DAYS BEFORE ISSUING A FINAL NEGATIVE REPORT Performed at [facility]    Report Status PENDING  Incomplete     Studies: Mr Shoulder Left Wo Contrast  08/05/2014   CLINICAL DATA:  Left shoulder mass on previous MRI. Subsequent humeral fixation and fever. Evaluate for source of infection. Initial encounter.  EXAM: MRI OF THE LEFT SHOULDER WITHOUT CONTRAST  TECHNIQUE: Multiplanar, multisequence MR imaging of the shoulder was performed. No intravenous contrast was administered.  COMPARISON:  Radiographs 06/27/2014 and MRI 07/03/2014.  FINDINGS:  Protocol was altered due to the previous surgery and indication. Patient has undergone interval left humeral intramedullary nail fixation. The hardware creates moderate susceptibility artifact. 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 The primary component measures approximately 2.4 x 3.4 x 4.9 cm, although are additional lobulated components extending more inferiorly and posteriorly, best seen on the sagittal images. No gross changes are identified, although the surrounding cortex is largely destroyed with a probable underlying pathologic fracture.  Rotator cuff: Intact with stable  mild supraspinatus and infraspinous tendinosis.  Muscles: There is mild generalized T2 hyperintensity within the muscles surrounding the shoulder, greatest within the teres major muscle.  Biceps long head:  Intact.  Acromioclavicular Joint: Stable mild acromioclavicular degenerative changes. No significant fluid in the subacromial -subdeltoid bursa.  Glenohumeral Joint: No significant joint effusion or glenohumeral arthropathy. No focal periarticular fluid collections are identified.  Labrum:  Stable without gross abnormality.  Bones: As above, large lytic lesion involving the proximal humerus status post intramedullary nail fixation. No definite lesions are demonstrated within the visualized scapula or clavicle.  Other: Prominent axillary lymph nodes are similar to the prior examination.  IMPRESSION: 1. Interval left humeral intramedullary nail fixation for pathologic fracture. The large lytic lesion involving the proximal left humerus is grossly unchanged. 2. No explanation for fever identified. There is nonspecific edema throughout the musculature surrounding the shoulder, but no focal fluid collection. 3. Stable left axillary adenopathy.   Electronically Signed   By: Camie Patience M.D.   On: 08/05/2014 08:20   Ct Biopsy  08/04/2014   CLINICAL DATA:  IgA myeloma.  The patient requires bone marrow biopsy.  EXAM: CT GUIDED BIOPSY OF RIGHT ILIAC BONE MARROW  ANESTHESIA/SEDATION: Versed 2.0 mg IV, Fentanyl 100 mcg IV  Total Moderate Sedation Time  12 minutes.  PROCEDURE: The procedure risks, benefits, and alternatives were explained to the patient. Questions regarding the procedure were encouraged and answered. The patient understands and consents to the procedure.  The right gluteal region was prepped with Betadine. Sterile gown and sterile gloves were used for the procedure. Local anesthesia was provided with 1% Lidocaine.  Under CT guidance, an 11 gauge OnControl bone cutting needle was advanced from a posterior approach into the right iliac bone. Needle positioning was confirmed with CT. Initial non heparinized and heparinized aspirate samples were obtained of bone marrow.  Core biopsy was performed wit through the outer needle.  COMPLICATIONS: None  FINDINGS: Inspection of initial aspirate did reveal visible particles. An intact core biopsy sample was obtained.  IMPRESSION: CT guided bone marrow biopsy of right posterior iliac bone with both aspirate and core samples obtained.   Electronically Signed   By: [name] M.D.   On: 08/04/2014 13:07   Dg Chest Port 1 View  08/03/2014   CLINICAL DATA:  Recent cancer therapy with fever and cough  EXAM: PORTABLE CHEST - 1 VIEW  COMPARISON:  07/04/2014  FINDINGS: Cardiac shadow is within normal limits. The lungs are well aerated bilaterally. No focal confluent infiltrate is noted. Diffuse interstitial changes are again noted. Postsurgical changes are seen in the proximal left humerus. Lytic lesion is again noted in the proximal left humerus.  IMPRESSION: Chronic interstitial changes without acute abnormality.   Electronically Signed   By: Inez Catalina M.D.   On: 08/03/2014 20:02    Scheduled Meds: . aspirin  325 mg Oral Daily  . docusate sodium  100 mg Oral BID  . enoxaparin (LOVENOX) injection  40 mg Subcutaneous Q24H   . famciclovir  500 mg Oral 3 times per day  . levofloxacin (LEVAQUIN) IV  500 mg Intravenous QHS  . piperacillin-tazobactam (ZOSYN)  IV  3.375 g Intravenous 3 times per day  . sodium chloride  3 mL Intravenous Q12H  . vancomycin  750 mg Intravenous Q12H   Continuous Infusions:   Active Problems:   Sepsis   Fever  Time spent: 39mn   Paulene Tayag, [facility] Pager [phone] If 7PM-7AM, please  contact night-coverage at [email or web address], password [facility] 08/05/2014, 1:25 PM  LOS: 2 days

## 2014-08-05 NOTE — Progress Notes (Signed)
Samantha Moyer reports her left arm is feeling a little better.  She reports pain in her right hip from her bone biopsy yesterday.  She reports having chills.  She has been given another tube of radiaplex to use twice a day on her left arm (after treatment and at bedtime) in the hospital.

## 2014-08-06 ENCOUNTER — Ambulatory Visit
Admission: RE | Admit: 2014-08-06 | Discharge: 2014-08-06 | Disposition: A | Discharge status: Home / Self Care | Payer: BC Managed Care – PPO | Source: Ambulatory Visit | Attending: Radiation Oncology | Admitting: Radiation Oncology

## 2014-08-06 ENCOUNTER — Other Ambulatory Visit: Payer: BC Managed Care – PPO | Admitting: Lab

## 2014-08-06 ENCOUNTER — Ambulatory Visit: Payer: BC Managed Care – PPO

## 2014-08-06 ENCOUNTER — Ambulatory Visit: Payer: BC Managed Care – PPO | Admitting: Family

## 2014-08-06 LAB — CBC
HCT: 29.7 % — ABNORMAL LOW (ref 36.0–46.0)
Hemoglobin: 9.2 g/dL — ABNORMAL LOW (ref 12.0–15.0)
MCH: 29.5 pg (ref 26.0–34.0)
MCHC: 31 g/dL (ref 30.0–36.0)
MCV: 95.2 fL (ref 78.0–100.0)
Platelets: 177 10*3/uL (ref 150–400)
RBC: 3.12 MIL/uL — ABNORMAL LOW (ref 3.87–5.11)
RDW: 13.3 % (ref 11.5–15.5)
WBC: 7.3 10*3/uL (ref 4.0–10.5)

## 2014-08-06 LAB — BASIC METABOLIC PANEL
ANION GAP: 15 (ref 5–15)
BUN: 5 mg/dL — ABNORMAL LOW (ref 6–23)
CALCIUM: 7.6 mg/dL — AB (ref 8.4–10.5)
CO2: 23 mEq/L (ref 19–32)
CREATININE: 0.79 mg/dL (ref 0.50–1.10)
Chloride: 96 mEq/L (ref 96–112)
GFR calc non Af Amer: >90 mL/min (ref >90)
Glucose, Bld: 94 mg/dL (ref 70–99)
Potassium: 3.4 mEq/L — ABNORMAL LOW (ref 3.7–5.3)
Sodium: 134 mEq/L — ABNORMAL LOW (ref 137–147)

## 2014-08-06 LAB — VANCOMYCIN, TROUGH: Vancomycin Tr: 7.3 ug/mL — ABNORMAL LOW (ref 10.0–20.0)

## 2014-08-06 MED ORDER — LEVOFLOXACIN 500 MG PO TABS
500.0000 mg | ORAL_TABLET | Freq: Once | ORAL | Status: AC
  Administered 2014-08-06: 500 mg via ORAL
  Filled 2014-08-06 (×2): qty 1

## 2014-08-06 MED ORDER — VANCOMYCIN HCL IN DEXTROSE 750-5 MG/150ML-% IV SOLN
750.0000 mg | Freq: Three times a day (TID) | INTRAVENOUS | Status: DC
  Filled 2014-08-06 (×3): qty 150

## 2014-08-06 MED ORDER — BIOTENE DRY MOUTH MT LIQD
15.0000 mL | Freq: Four times a day (QID) | OROMUCOSAL | Status: DC
  Administered 2014-08-06 – 2014-08-07 (×4): 15 mL via OROMUCOSAL

## 2014-08-06 MED ORDER — FAMCICLOVIR 500 MG PO TABS
500.0000 mg | ORAL_TABLET | Freq: Every day | ORAL | Status: DC
  Administered 2014-08-07 – 2014-08-08 (×2): 500 mg via ORAL
  Filled 2014-08-06 (×2): qty 1

## 2014-08-06 MED ORDER — VANCOMYCIN HCL IN DEXTROSE 1-5 GM/200ML-% IV SOLN
1000.0000 mg | Freq: Three times a day (TID) | INTRAVENOUS | Status: DC
  Filled 2014-08-06: qty 200

## 2014-08-06 NOTE — Progress Notes (Signed)
ANTIBIOTIC CONSULT NOTE - INITIAL  Pharmacy Consult for Vancomycin and Zosyn  Indication: Sepsis  Allergies  Allergen Reactions  . Codeine Nausea Only  . Tramadol Nausea And Vomiting and Other (See Comments)    Patient Measurements: Height: 5' 4.5" (163.8 cm) Weight: 130 lb 4.7 oz (59.1 kg) IBW/kg (Calculated) : 55.85 Adjusted Body Weight:   Vital Signs: Temp: 99 F (37.2 C) (12/16 0607) Temp Source: Oral (12/16 0607) BP: 102/59 mmHg (12/16 0607) Pulse Rate: 94 (12/16 0607) Intake/Output from previous day:   Intake/Output from this shift:    Labs:  Recent Labs  08/04/14 0545 08/05/14 0455 08/06/14 0429  WBC 7.8 5.5 7.3  HGB 8.9* 8.8* 9.2*  PLT 221 176 177  CREATININE 0.63 0.75 0.79   Estimated Creatinine Clearance: 74.2 mL/min (by C-G formula based on Cr of 0.79).  Recent Labs  08/06/14 0937  VANCOTROUGH 7.3*     Microbiology: Recent Results (from the past 720 hour(s))  Urine culture     Status: None   Collection Time: 08/03/14  7:40 PM  Result Value Ref Range Status   Specimen Description URINE, CLEAN CATCH  Final   Special Requests NONE  Final   Culture  Setup Time   Final    08/04/2014 03:28 Performed at [place] Performed at [facility]   Final   Culture NO GROWTH Performed at [facility]   Final   Report Status 08/05/2014 FINAL  Final  Blood Culture (routine x 2)     Status: None (Preliminary result)   Collection Time: 08/03/14  8:15 PM  Result Value Ref Range Status   Specimen Description BLOOD RIGHT ANTECUBITAL  Final   Special Requests BOTTLES DRAWN AEROBIC AND ANAEROBIC 5CC  Final   Culture  Setup Time   Final    08/04/2014 03:11 Performed at [facility]    Culture   Final           BLOOD CULTURE RECEIVED NO GROWTH TO DATE CULTURE WILL BE HELD FOR 5 DAYS BEFORE ISSUING A FINAL NEGATIVE REPORT Performed at [facility]    Report Status PENDING  Incomplete   Blood Culture (routine x 2)     Status: None (Preliminary result)   Collection Time: 08/03/14  8:50 PM  Result Value Ref Range Status   Specimen Description BLOOD R FA  Final   Special Requests BOTTLES DRAWN AEROBIC AND ANAEROBIC 5CC  Final   Culture  Setup Time   Final    08/04/2014 03:11 Performed at [facility]    Culture   Final           BLOOD CULTURE RECEIVED NO GROWTH TO DATE CULTURE WILL BE HELD FOR 5 DAYS BEFORE ISSUING A FINAL NEGATIVE REPORT Performed at [facility]    Report Status PENDING  Incomplete    Medical History: Past Medical History  Diagnosis Date  . Scleroderma     Tobie Lords  . IgA myeloma 07/21/2014  . Raynaud disease     Medications:  Anti-infectives    Start     Dose/Rate Route Frequency Ordered Stop   08/07/14 1000  famciclovir [facility]) tablet 500 mg     500 mg Oral Daily 08/06/14 0651     08/06/14 1400  vancomycin (VANCOCIN) IVPB 1000 mg/200 mL premix     1,000 mg200 mL/hr over 60 Minutes Intravenous Every 8 hours 08/06/14 1130     08/04/14  1000  famciclovir [facility]) tablet 500 mg  Status:  Discontinued     500 mg Oral Daily 08/04/14 0100 08/04/14 0735   08/04/14 1000  vancomycin (VANCOCIN) IVPB 750 mg/150 ml premix  Status:  Discontinued     750 mg150 mL/hr over 60 Minutes Intravenous Every 12 hours 08/04/14 0452 08/06/14 1130   08/04/14 0830  famciclovir (FAMVIR) tablet 500 mg  Status:  Discontinued     500 mg Oral 3 times per day 08/04/14 0735 08/06/14 0651   08/04/14 0600  piperacillin-tazobactam (ZOSYN) IVPB 3.375 g  Status:  Discontinued     3.375 g12.5 mL/hr over 240 Minutes Intravenous 3 times per day 08/04/14 0452 08/06/14 0651   08/04/14 0115  levofloxacin (LEVAQUIN) IVPB 500 mg     500 mg100 mL/hr over 60 Minutes Intravenous Daily at bedtime 08/04/14 0100     08/03/14 2115  piperacillin-tazobactam (ZOSYN) IVPB 3.375 g     3.375 g100 mL/hr over 30 Minutes Intravenous  Once 08/03/14 2113 08/03/14 2205   08/03/14  2115  vancomycin (VANCOCIN) IVPB 1000 mg/200 mL premix     1,000 mg200 mL/hr over 60 Minutes Intravenous  Once 08/03/14 2113 08/03/14 2357     Assessment: 48 y/o F with IgA myeloma presented to ED with fever and nonproductive cough, admitted with possible sepsis. She began her first cycle of Velcade/Revlimid/Decadron on 12/9 and XRT 12/11. Has rash on R thigh, ? Cellulitis vs shingles. Empiric vanco/Zosyn ordered for r/o sepsis.  Noted with rod insertion into L shoulder several weeks ago  08/03/2014 >> vanc >> 08/03/2014 >> zosyn >> 08/04/2014 >> levofloxacin (MD) >>  Tmax:  afeb since 12/14 WBCs: WNL since admit Renal:  WNL, Norm CrCl 181ml/min  12/13 blood x2: NGTD 12/13 urine: NGF 12/14 influenza PCR: neg 12/15: MRI L shoulder not consistent with infectious source  Dose changes/drug level info:  12/16 VT = 7.3 on 750 mg q12; changed to 750 mg q8   Goal of Therapy:  Vancomycin trough levels 10-15 mg/dl Zosyn based on renal function   Plan:   Increase vancomycin to 750 mg IV q8hr.  I do not feel the need to continue to target higher trough levels given that the patient's infection did not deteriorate beyond SIRS and appears to be a straightforward cellulitis.  Suggest stopping vancomycin soon as patient has improved with subtherapeutic trough levels and all cultures negative so far.  Additionally, without purulence, MRSA is less likely the causative agent.    Continue Zosyn 3.375g IV Q8H infused over 4hrs. Would consider de-escalating to PO PCN or 1G ceph once stable as patient does not appear to be immunocompromised at this point.  Follow clinical course, renal function, and cultures as available  Follow for de-escalation of antibiotics and length of therapy  Reuel Boom, PharmD Pager: [phone] 08/06/2014, 12:29 PM

## 2014-08-06 NOTE — Progress Notes (Addendum)
[facility] PROGRESS NOTE  Samantha Moyer MRN:[id number] DOB: 04/11/1966 DOA: 08/03/2014 PCP: DAUB, STEVE A, MD Brief narrative 48-year-old female with history of multiple myeloma undergoing chemotherapy (plasma cell myeloma as per biopsy), pathological fracture of left humerus status post surgery in November 2015 presented to the hospital on 08/03/2014 with fever of 101.5F, tachypnea meeting criteria for service possibly due to right lower extremity cellulitis. Patient placed on empiric antibiotics with gradual improvement.  Assessment/Plan: SIRS Possible etiology is a right thigh  Rash which is improving with empiric antibiotics. Patient also had right left shoulder pain and tenderness for which MRI of the left shoulder was done which is negative for any infection. -Patient has been afebrile for the past 24 hours. Cultures have been negative. -Continue empiric abx. Will narrow down if afebrile next 24 hrs. supportive care.  Anemia and thrombocytopenia Secondary to myeloma. No need for transfusion at this time. Platelets improved  Hyponatremia Resolved with hydration  IgA Multiple myeloma Patient has received first cycle of chemotherapy on 12/9 Velcade/Revlimid/Decadron/Zometa  Currently getting radiation therapy.  Oncology following.  Left shoulder pain No signs of infection on MRI. Recent surgery of left shoulder for pathological fracture. Lighted lesions on MRI appears stable and unchanged. Continue radiation therapy. Will order physical therapy evaluation  Protein calorie malnutrition Nutrition consult  Hypokalemia  replenished   Poor IV access  order PICC    Code Status: full code Family Communication: none at  bedside Disposition Plan: home once improved . Possibly in 24 -48 hrs   Consultants:  oncology  Procedures:  radiation therpay  Antibiotics:  levaquin 12/13>>>  Vancomycin 12/13>>>  HPI/Subjective: Seen and examined after returning from  radiation. Reports having one episode of sweating this am.   Objective: Filed Vitals:   08/06/14 0607  BP: 102/59  Pulse: 94  Temp: 99 F (37.2 C)  Resp: 16   No intake or output data in the 24 hours ending 08/06/14 1240 Filed Weights   08/04/14 0100 08/04/14 0351 08/05/14 0456  Weight: 62.188 kg (137 lb 1.6 oz) 59.966 kg (132 lb 3.2 oz) 59.1 kg (130 lb 4.7 oz)    Exam:   General:  Middle aged thin built female in NAD  HEENT: no pallor, moist mucosa   chest: clear b/l   CVS: NS1&S2, no murmurs  Abd: soft, NT, N,D BS+  Ext: warm, swelling over rt shoulder with tenderness and restricted ROM  CNS: alert and oriented  Data Reviewed: Basic Metabolic Panel:  Recent Labs Lab 08/03/14 2030 08/04/14 0545 08/05/14 0455 08/06/14 0429  NA 133* 139 136* 134*  K 3.9 3.6* 3.3* 3.4*  CL 96 103 99 96  CO2 20 22 22 23  GLUCOSE 94 98 100* 94  BUN 9 7 7 5*  CREATININE 0.70 0.63 0.75 0.79  CALCIUM 8.3* 7.8* 7.5* 7.6*   Liver Function Tests:  Recent Labs Lab 08/03/14 2030 08/04/14 0545 08/05/14 0455  AST 34 28 54*  ALT 20 21 25  ALKPHOS 78 67 64  BILITOT 0.4 0.5 0.4  PROT 9.5* 8.5* 7.9  ALBUMIN 2.5* 2.3* 2.1*   No results for input(s): LIPASE, AMYLASE in the last 168 hours. No results for input(s): AMMONIA in the last 168 hours. CBC:  Recent Labs Lab 08/03/14 2030 08/04/14 0545 08/05/14 0455 08/06/14 0429  WBC 6.2 7.8 5.5 7.3  NEUTROABS 5.8 7.0  --   --   HGB 9.5* 8.9* 8.8* 9.2*  HCT 30.3* 29.0* 28.7* 29.7*  MCV 96.5 96.7 97.0   95.2  PLT 124* 221 176 177   Cardiac Enzymes: No results for input(s): CKTOTAL, CKMB, CKMBINDEX, TROPONINI in the last 168 hours. BNP (last 3 results) No results for input(s): PROBNP in the last 8760 hours. CBG: No results for input(s): GLUCAP in the last 168 hours.  Recent Results (from the past 240 hour(s))  Urine culture     Status: None   Collection Time: 08/03/14  7:40 PM  Result Value Ref Range Status   Specimen  Description URINE, CLEAN CATCH  Final   Special Requests NONE  Final   Culture  Setup Time   Final    08/04/2014 03:28 Performed at [place] Performed at [facility]   Final   Culture NO GROWTH Performed at [facility]   Final   Report Status 08/05/2014 FINAL  Final  Blood Culture (routine x 2)     Status: None (Preliminary result)   Collection Time: 08/03/14  8:15 PM  Result Value Ref Range Status   Specimen Description BLOOD RIGHT ANTECUBITAL  Final   Special Requests BOTTLES DRAWN AEROBIC AND ANAEROBIC 5CC  Final   Culture  Setup Time   Final    08/04/2014 03:11 Performed at [facility]    Culture   Final           BLOOD CULTURE RECEIVED NO GROWTH TO DATE CULTURE WILL BE HELD FOR 5 DAYS BEFORE ISSUING A FINAL NEGATIVE REPORT Performed at [facility]    Report Status PENDING  Incomplete  Blood Culture (routine x 2)     Status: None (Preliminary result)   Collection Time: 08/03/14  8:50 PM  Result Value Ref Range Status   Specimen Description BLOOD R FA  Final   Special Requests BOTTLES DRAWN AEROBIC AND ANAEROBIC 5CC  Final   Culture  Setup Time   Final    08/04/2014 03:11 Performed at [facility]    Culture   Final           BLOOD CULTURE RECEIVED NO GROWTH TO DATE CULTURE WILL BE HELD FOR 5 DAYS BEFORE ISSUING A FINAL NEGATIVE REPORT Performed at [facility]    Report Status PENDING  Incomplete     Studies: Mr Shoulder Left Wo Contrast  08/05/2014   CLINICAL DATA:  Left shoulder mass on previous MRI. Subsequent humeral fixation and fever. Evaluate for source of infection. Initial encounter.  EXAM: MRI OF THE LEFT SHOULDER WITHOUT CONTRAST  TECHNIQUE: Multiplanar, multisequence MR imaging of the shoulder was performed. No intravenous contrast was administered.  COMPARISON:  Radiographs 06/27/2014 and MRI 07/03/2014.  FINDINGS: Protocol was altered due to the previous surgery  and indication. Patient has undergone interval left humeral intramedullary nail fixation. The hardware creates moderate susceptibility artifact. 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 The primary component measures approximately 2.4 x 3.4 x 4.9 cm, although are additional lobulated components extending more inferiorly and posteriorly, best seen on the sagittal images. No gross changes are identified, although the surrounding cortex is largely destroyed with a probable underlying pathologic fracture.  Rotator cuff: Intact with stable mild supraspinatus and infraspinous tendinosis.  Muscles: There is mild generalized T2 hyperintensity within the muscles surrounding the shoulder, greatest within the teres major muscle.  Biceps long head:  Intact.  Acromioclavicular Joint: Stable mild acromioclavicular degenerative changes. No significant fluid in the subacromial -subdeltoid bursa.  Glenohumeral  Joint: No significant joint effusion or glenohumeral arthropathy. No focal periarticular fluid collections are identified.  Labrum:  Stable without gross abnormality.  Bones: As above, large lytic lesion involving the proximal humerus status post intramedullary nail fixation. No definite lesions are demonstrated within the visualized scapula or clavicle.  Other: Prominent axillary lymph nodes are similar to the prior examination.  IMPRESSION: 1. Interval left humeral intramedullary nail fixation for pathologic fracture. The large lytic lesion involving the proximal left humerus is grossly unchanged. 2. No explanation for fever identified. There is nonspecific edema throughout the musculature surrounding the shoulder, but no focal fluid collection. 3. Stable left axillary adenopathy.   Electronically Signed   By: Camie Patience M.D.   On: 08/05/2014 08:20    Scheduled Meds: . antiseptic oral rinse  15 mL Mouth Rinse 4 times per day  . aspirin  325 mg Oral  Daily  . docusate sodium  100 mg Oral BID  . enoxaparin (LOVENOX) injection  40 mg Subcutaneous Q24H  . [START ON 08/07/2014] famciclovir  500 mg Oral Daily  . levofloxacin (LEVAQUIN) IV  500 mg Intravenous QHS  . sodium chloride  3 mL Intravenous Q12H  . vancomycin  750 mg Intravenous Q8H   Continuous Infusions:     Time spent: 25 minutes    Jadier Rockers, [place]  [facility] Pager [phone]. If 7PM-7AM, please contact night-coverage at [email or web address], password [email or web address] 08/06/2014, 12:40 PM  LOS: 3 days

## 2014-08-06 NOTE — Progress Notes (Signed)
Samantha Moyer   DOB:[birth year]   HO#:[id number]   ASU#:[id number]  Patient Care Team: Darlyne Russian, Moyer as PCP - General (Family Medicine)  Subjective: Events since 08/05/14 noted. Afebrile. Left shoulder pain improving,controlled with current meds. Tolerating radiation treatments. Rash on the RLE improving. No bleeding issues.  Scheduled Meds: . antiseptic oral rinse  15 mL Mouth Rinse 4 times per day  . aspirin  325 mg Oral Daily  . docusate sodium  100 mg Oral BID  . enoxaparin (LOVENOX) injection  40 mg Subcutaneous Q24H  . [START ON 08/07/2014] famciclovir  500 mg Oral Daily  . levofloxacin (LEVAQUIN) IV  500 mg Intravenous QHS  . sodium chloride  3 mL Intravenous Q12H  . vancomycin  750 mg Intravenous Q12H   Continuous Infusions:  PRN Meds:acetaminophen **OR** acetaminophen, HYDROmorphone, LORazepam, ondansetron **OR** ondansetron (ZOFRAN) IV, prochlorperazine   Objective:  Filed Vitals:   08/06/14 0607  BP: 102/59  Pulse: 94  Temp: 99 F (37.2 C)  Resp: 16     No intake or output data in the 24 hours ending 08/06/14 1002  ECOG PERFORMANCE STATUS:49  48 year-old in no acute distress,conversant, alert and oriented to time, place and date.  General well-developed and thin  HEENT: Normocephalic, atraumatic.Sclera anicteric.Oral cavity without thrush or lesions. Neck: Supple. No thyromegaly,no cervical or supraclavicular adenopathy  Lungs: Clear to auscultation. No wheezing, rhonchi or rales. Cardiac: Regular rate and rhythm, no murmur,rubs or gallops Abdomen: Soft nontender,bowel sounds x4. Extremities: No clubbing cyanosis or edema. No petechial rash. Improved right lower extremity rash. Left shoulder with incisional tenderness. Neuro: No focal or motor deficits    CBG (last 3)  No results for input(s): GLUCAP in the last 72 hours.   Labs:   Recent Labs Lab 08/03/14 2030 08/04/14 0545 08/05/14 0455 08/06/14 0429  WBC 6.2 7.8 5.5 7.3  HGB 9.5* 8.9* 8.8*  9.2*  HCT 30.3* 29.0* 28.7* 29.7*  PLT 124* 221 176 177  MCV 96.5 96.7 97.0 95.2  MCH 30.3 29.7 29.7 29.5  MCHC 31.4 30.7 30.7 31.0  RDW 13.0 13.1 13.4 13.3  LYMPHSABS 0.2* 0.4*  --   --   MONOABS 0.2 0.3  --   --   EOSABS 0.1 0.1  --   --   BASOSABS 0.0 0.0  --   --      Chemistries:    Recent Labs Lab 08/03/14 2030 08/04/14 0545 08/05/14 0455 08/06/14 0429  NA 133* 139 136* 134*  K 3.9 3.6* 3.3* 3.4*  CL 96 103 99 96  CO2 20 22 22 23   GLUCOSE 94 98 100* 94  BUN 9 7 7  5*  CREATININE 0.70 0.63 0.75 0.79  CALCIUM 8.3* 7.8* 7.5* 7.6*  AST 34 28 54*  --   ALT 20 21 25   --   ALKPHOS 78 67 64  --   BILITOT 0.4 0.5 0.4  --     GFR Estimated Creatinine Clearance: 74.2 mL/min (by C-G formula based on Cr of 0.79).  Liver Function Tests:  Recent Labs Lab 08/03/14 2030 08/04/14 0545 08/05/14 0455  AST 34 28 54*  ALT 20 21 25   ALKPHOS 78 67 64  BILITOT 0.4 0.5 0.4  PROT 9.5* 8.5* 7.9  ALBUMIN 2.5* 2.3* 2.1*    Urine Studies     Component Value Date/Time   COLORURINE YELLOW 08/03/2014 1940   APPEARANCEUR CLEAR 08/03/2014 1940   LABSPEC 1.009 08/03/2014 1940   PHURINE 7.0 08/03/2014 1940  GLUCOSEU NEGATIVE 08/03/2014 1940   HGBUR TRACE* 08/03/2014 1940   BILIRUBINUR NEGATIVE 08/03/2014 1940   Samantha Moyer NEGATIVE 08/03/2014 1940   PROTEINUR NEGATIVE 08/03/2014 1940   UROBILINOGEN 1.0 08/03/2014 1940   NITRITE NEGATIVE 08/03/2014 1940   LEUKOCYTESUR NEGATIVE 08/03/2014 1940    Microbiology Cultures negative to date  for CORDA, SHUTT ([id number]) Patient: Samantha Moyer Accession: [id number] Received: 08/04/2014 Samantha Moyer DOB: 12-18-1965 Age: 13 Gender: F Reported: 08/06/2014 [address] Patient Ph: [phone] MRN #: [id number] [place], [place] [address] Visit #: [id number].Samantha Moyer Chart #: Phone: [phone] Fax: CC: Samantha Moyer BONE MARROW REPORT FINAL DIAGNOSIS Diagnosis Bone  Marrow, Aspirate,Biopsy, and Clot, right iliac - PLASMA CELL MYELOMA. - SEE COMMENT. PERIPHERAL BLOOD: - NORMOCYTIC ANEMIA. - MILD ROULEAUX FORMATION. - NEUTROPHILIA. Diagnosis Note The overall findings are consistent with plasma cell myeloma. There is a peripheral neutrophilia and mild marrow myeloid hyperplasia consistent with recent steroid therapy. Samantha Males Moyer Pathologist, Electronic Signature (Case signed 08/06/2014) GROSS AND MICROSCOPIC INFORMATION Specimen Clinical Information myeloma (kp) Source Bone Marrow, Aspirate,Biopsy, and Clot, right iliac Microscopic LAB DATA: CBC performed on 08/04/2014 shows: WBC 7.8 K/ul Neutrophils 90% HB 8.9 g/dl Lymphocytes 7% HCT 29.0 % Monocytes 2% MCV 96.7 fL Eosinophils 1% RDW 13.1 % Basophils 0% 1 of 3 FINAL for Samantha Moyer ([id number])  Microscopic(continued) PLT 221 K/ul PERIPHERAL BLOOD SMEAR: There is a normocytic anemia with mild rouleaux formation. Leukocytes are not increased, but there is a relative neutrophilia. Circulating plasma cells are not identified. Platelets are present in normal numbers. BONE MARROW ASPIRATE: Spicular and cellular.Erythroid precursors: Overall decreased with mild relative decrease. No significant dysplasia.Granulocytic precursors: Overall decreased with mild relative increase. No significant dysplasia. No increase in blasts. Megakaryocytes: Present. Morphologically unremarkable. Lymphocytes/plasma cells: There is a marked increase in plasma cells (58% by manual differential counts).The plasma cells are atypical with large forms, prominent nucleoli, and bi-nucleation. There is no increase in lymphocytes. TOUCH PREPARATIONS: Similar to aspirate smears. CLOT and BIOPSY: The core biopsy and clot section are hypercellular for age (80% overall) with sheets of atypical plasma cells. Immunohistochemistry reveals markedly increased plasma cells (80-90%). By light chain immunohistochemistry the plasma cells  are lambda-restricted. Background hematopoiesis is present, but reduced. There are no atypical lymphoid aggregates. IRON STAIN: Iron stains are performed on a bone marrow aspirate smear and section of clot. The controls stained appropriately. Storage Iron: Present. Ringed Sideroblasts: Absent. ADDITIONAL DATA / TESTING: Cytogenetics was ordered and will be reported separately.   Specimen Table Bone Marrow count performed on 500 cells shows: Blasts: 0% Myeloid 31% Promyelocyts: 0% Myelocytes: 7% Erythroid 7% Metamyelocyts: 2% Bands: 7% Lymphocytes: 4% Neutrophils: 12% Eosinophils: 3% Plasma Cells: 58% Basophils: 0% Monocytes: 0% M:E ratio: 4.27 Gross Received in [facility] is a 0.8 x 0.6 x 0.1 cm aggregate of tan-red soft tissue, submitted in block A.Also received in [facility] in a separate container is a 2.5 x 0.2 cm core of tan-red firm tissue, submitted in block B following decalcification. (SSW:ecj 08/04/2014)   Imaging Studies:  Mr Shoulder Left Wo Contrast  08/05/2014   CLINICAL DATA:  Left shoulder mass on previous MRI. Subsequent humeral fixation and fever. Evaluate for source of infection. Initial encounter.  EXAM: MRI OF THE LEFT SHOULDER WITHOUT CONTRAST  TECHNIQUE: Multiplanar, multisequence MR imaging of the shoulder was performed. No intravenous contrast was administered.  COMPARISON:  Radiographs 06/27/2014 and MRI 07/03/2014.  FINDINGS: Protocol was altered due to the previous surgery and indication.  Patient has undergone interval left humeral intramedullary nail fixation. The hardware creates moderate susceptibility artifact. 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 The primary component measures approximately 2.4 x 3.4 x 4.9 cm, although are additional lobulated components extending more inferiorly and posteriorly, best seen on the sagittal images. No gross changes are identified, although the surrounding cortex is  largely destroyed with a probable underlying pathologic fracture.  Rotator cuff: Intact with stable mild supraspinatus and infraspinous tendinosis.  Muscles: There is mild generalized T2 hyperintensity within the muscles surrounding the shoulder, greatest within the teres major muscle.  Biceps long head:  Intact.  Acromioclavicular Joint: Stable mild acromioclavicular degenerative changes. No significant fluid in the subacromial -subdeltoid bursa.  Glenohumeral Joint: No significant joint effusion or glenohumeral arthropathy. No focal periarticular fluid collections are identified.  Labrum:  Stable without gross abnormality.  Bones: As above, large lytic lesion involving the proximal humerus status post intramedullary nail fixation. No definite lesions are demonstrated within the visualized scapula or clavicle.  Other: Prominent axillary lymph nodes are similar to the prior examination.  IMPRESSION: 1. Interval left humeral intramedullary nail fixation for pathologic fracture. The large lytic lesion involving the proximal left humerus is grossly unchanged. 2. No explanation for fever identified. There is nonspecific edema throughout the musculature surrounding the shoulder, but no focal fluid collection. 3. Stable left axillary adenopathy.   Electronically Signed   By: Camie Patience M.D.   On: 08/05/2014 08:20   Ct Biopsy  08/04/2014   CLINICAL DATA:  IgA myeloma. The patient requires bone marrow biopsy.  EXAM: CT GUIDED BIOPSY OF RIGHT ILIAC BONE MARROW  ANESTHESIA/SEDATION: Versed 2.0 mg IV, Fentanyl 100 mcg IV  Total Moderate Sedation Time  12 minutes.  PROCEDURE: The procedure risks, benefits, and alternatives were explained to the patient. Questions regarding the procedure were encouraged and answered. The patient understands and consents to the procedure.  The right gluteal region was prepped with Betadine. Sterile gown and sterile gloves were used for the procedure. Local anesthesia was provided with 1%  Lidocaine.  Under CT guidance, an 11 gauge OnControl bone cutting needle was advanced from a posterior approach into the right iliac bone. Needle positioning was confirmed with CT. Initial non heparinized and heparinized aspirate samples were obtained of bone marrow.  Core biopsy was performed wit through the outer needle.  COMPLICATIONS: None  FINDINGS: Inspection of initial aspirate did reveal visible particles. An intact core biopsy sample was obtained.  IMPRESSION: CT guided bone marrow biopsy of right posterior iliac bone with both aspirate and core samples obtained.   Electronically Signed   By: Samantha Moyer M.D.   On: 08/04/2014 13:07   Dg Chest Port 1 View  08/03/2014   CLINICAL DATA:  Recent cancer therapy with fever and cough  EXAM: PORTABLE CHEST - 1 VIEW  COMPARISON:  07/04/2014  FINDINGS: Cardiac shadow is within normal limits. The lungs are well aerated bilaterally. No focal confluent infiltrate is noted. Diffuse interstitial changes are again noted. Postsurgical changes are seen in the proximal left humerus. Lytic lesion is again noted in the proximal left humerus.  IMPRESSION: Chronic interstitial changes without acute abnormality.   Electronically Signed   By: Inez Catalina M.D.   On: 08/03/2014 20:02    Assessment/Plan: 48 y.o.  Right thigh rash Etiology unknown to date, rule out cellulitis versus viral (?shingles) Cultures are negative to date. Flu swab negative Continue antibiotics with Vanco,  Levaquin, as well as famiclovir.   IgA Multiple Myeloma.  She had received her first cycle of chemotherapy with Velcade/Revlimid/Decadron/Zometa on 12/9.  S/p Bone right iliac marrow biopsy on 08/04/14, consistent with Plasma Cell Myeloma MRI of the left shoulder on 08/05/14 shows the interval left humeral intramedullary nail fixation for pathologic fracture. The large lytic lesion involving the proximal left humerus is grossly unchanged. There is nonspecific edema throughout the  musculature surrounding the shoulder, but no focal fluid collection or explanation for her fever. Stable left axillary adenopathy. Agree with continuing with radiation therapy to the left humerus, s/p treatment #4 of 10, for #5 today for a total dose of 30 Gy  Anemia, Normocytic In the setting of acute illness/sepsis, IVF, antibiotics and recent chemotherapy No transfusion is indicated at this time. Continue to monitor  Malnutrition Consider Nutrition consult while in hospital  DVT prophylaxis On Lovenox, as thrombocytopenia improved. DC Lovenox if platelets drop to 70k  Full Code  Other medical issues as per admitting team     **Disclaimer: This note was dictated with voice recognition software. Similar sounding words can inadvertently be transcribed and this note may contain transcription errors which may not have been corrected upon publication of note.** WERTMAN,SARA E, PA-C 08/06/2014  10:02 AM   ADDENDUM:  I saw and examined the patient. I agree with the above.  The bone marrow biopsy did come back as myeloma. She has probably 90% myeloma by immunohistochemical staining. This is no surprise.  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 This is a poor prognostic factor for myeloma. 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 I think these explain why she has aggressive disease.  She looks better. She is slowly getting better.  One possibility for this current clinical admission is that she could've had a reaction to Zometa. A similar clinical syndrome is seen after Zometa. This is seen in probably one out of 20 patients.. I think the only way to know if this is the case, is if we treat her again with Zometa. What I may do is just make sure the Zometa is infused over 45 minutes.  I think if the cultures are negative, and she is afebrile tomorrow, then I would stop the antibiotics.  She will continue her radiation  therapy.  I very much appreciate the outstanding care that she is getting from the hospitalist and the staff on 5 E.!!!!  Laurey Arrow

## 2014-08-07 ENCOUNTER — Ambulatory Visit
Admission: RE | Admit: 2014-08-07 | Discharge: 2014-08-07 | Disposition: A | Discharge status: Home / Self Care | Payer: BC Managed Care – PPO | Source: Ambulatory Visit | Attending: Radiation Oncology | Admitting: Radiation Oncology

## 2014-08-07 ENCOUNTER — Other Ambulatory Visit: Payer: Self-pay | Admitting: *Deleted

## 2014-08-07 DIAGNOSIS — C9000 Multiple myeloma not having achieved remission: Secondary | ICD-10-CM

## 2014-08-07 DIAGNOSIS — M25519 Pain in unspecified shoulder: Secondary | ICD-10-CM | POA: Diagnosis present

## 2014-08-07 DIAGNOSIS — M7592 Shoulder lesion, unspecified, left shoulder: Secondary | ICD-10-CM

## 2014-08-07 MED ORDER — BOOST / RESOURCE BREEZE PO LIQD
1.0000 | Freq: Two times a day (BID) | ORAL | Status: DC
  Administered 2014-08-08: 1 via ORAL

## 2014-08-07 MED ORDER — HYDROMORPHONE HCL 2 MG/ML IJ SOLN: 2.0000 mg | Freq: Once | INTRAMUSCULAR | Status: DC

## 2014-08-07 MED ORDER — HYDROMORPHONE HCL 2 MG PO TABS
2.0000 mg | ORAL_TABLET | Freq: Once | ORAL | Status: AC
Start: 2014-08-07 — End: 2014-08-07
  Administered 2014-08-07: 2 mg via ORAL

## 2014-08-07 MED ORDER — LENALIDOMIDE 25 MG PO CAPS: ORAL_CAPSULE | ORAL | Status: DC

## 2014-08-07 NOTE — Progress Notes (Signed)
[facility] PROGRESS NOTE  Samantha Moyer SWH:[id number] DOB: [birth year] DOA: 08/03/2014 PCP: Samantha Reichmann, MD  Brief narrative 48 year old female with history of multiple myeloma undergoing chemotherapy (plasma cell myeloma as per biopsy), pathological fracture of left humerus status post surgery in November 2015 presented to the hospital on 08/03/2014 with fever of 101.5F, tachypnea meeting criteria for service possibly due to right lower extremity cellulitis. Patient placed on empiric antibiotics with gradual improvement.  Assessment/Plan: SIRS Possible etiology is a right thigh Rash versus recent chemotherapy.. Patient also had right left shoulder pain and tenderness for which MRI of the left shoulder was done which is negative for any infection. -Patient has been afebrile for the past 48 hours. Cultures have been negative. -Antibiotics discontinued  Anemia and thrombocytopenia Secondary to myeloma. No need for transfusion at this time. Platelets improved  Hyponatremia Resolved with hydration  IgA Multiple myeloma Patient has received first cycle of chemotherapy on 12/9 Velcade/Revlimid/Decadron/Zometa  Currently getting radiation therapy. Oncology follow-up appreciated  Left shoulder pain No signs of infection on MRI. Recent surgery of left shoulder for pathological fracture. Lighted lesions on MRI appears stable and unchanged. Continue radiation therapy.  Pain worsened following physical therapy done today. (Reports she was trying to swing her arms while walking.) Continue when necessary Dilaudid.  Protein calorie malnutrition Nutrition consulted  Hypokalemia replenished    Poor IV access Agree that patient does not need PICC line. Possibly discharge home in a.m. if improved    Code Status: full code Family Communication: none at bedside Disposition Plan: home possibly in a.m.   Consultants:  oncology  Procedures:  radiation  therpay  Antibiotics:  levaquin 12/13>>> 12/17  Vancomycin 12/13>>> 12/17  HPI/Subjective: Patient complains of severe pain in her left arm after physical therapy. Remains afebrile  Objective: Filed Vitals:   08/07/14 1435  BP: 100/52  Pulse: 98  Temp: 98.4 F (36.9 C)  Resp: 18   No intake or output data in the 24 hours ending 08/07/14 1546 Filed Weights   08/04/14 0351 08/05/14 0456 08/07/14 0545  Weight: 59.966 kg (132 lb 3.2 oz) 59.1 kg (130 lb 4.7 oz) 58.333 kg (128 lb 9.6 oz)    Exam:   General: Middle aged thin built female in some distress due to pain in her left shoulder  HEENT: no pallor, moist mucosa  chest: clear b/l  CVS: NS1&S2, no murmurs  Abd: soft, NT, N,D BS+  Ext: warm, swelling over rt shoulder with tenderness and restricted ROM  CNS: alert and oriented  Data Reviewed: Basic Metabolic Panel:  Recent Labs Lab 08/03/14 2030 08/04/14 0545 08/05/14 0455 08/06/14 0429  NA 133* 139 136* 134*  K 3.9 3.6* 3.3* 3.4*  CL 96 103 99 96  CO2 20 22 22 23   GLUCOSE 94 98 100* 94  BUN 9 7 7  5*  CREATININE 0.70 0.63 0.75 0.79  CALCIUM 8.3* 7.8* 7.5* 7.6*   Liver Function Tests:  Recent Labs Lab 08/03/14 2030 08/04/14 0545 08/05/14 0455  AST 34 28 54*  ALT 20 21 25   ALKPHOS 78 67 64  BILITOT 0.4 0.5 0.4  PROT 9.5* 8.5* 7.9  ALBUMIN 2.5* 2.3* 2.1*   No results for input(s): LIPASE, AMYLASE in the last 168 hours. No results for input(s): AMMONIA in the last 168 hours. CBC:  Recent Labs Lab 08/03/14 2030 08/04/14 0545 08/05/14 0455 08/06/14 0429  WBC 6.2 7.8 5.5 7.3  NEUTROABS 5.8 7.0  --   --   HGB 9.5*  8.9* 8.8* 9.2*  HCT 30.3* 29.0* 28.7* 29.7*  MCV 96.5 96.7 97.0 95.2  PLT 124* 221 176 177   Cardiac Enzymes: No results for input(s): CKTOTAL, CKMB, CKMBINDEX, TROPONINI in the last 168 hours. BNP (last 3 results) No results for input(s): PROBNP in the last 8760 hours. CBG: No results for input(s): GLUCAP in the last 168  hours.  Recent Results (from the past 240 hour(s))  Urine culture     Status: None   Collection Time: 08/03/14  7:40 PM  Result Value Ref Range Status   Specimen Description URINE, CLEAN CATCH  Final   Special Requests NONE  Final   Culture  Setup Time   Final    08/04/2014 03:28 Performed at [place] Performed at [facility]   Final   Culture NO GROWTH Performed at [facility]   Final   Report Status 08/05/2014 FINAL  Final  Blood Culture (routine x 2)     Status: None (Preliminary result)   Collection Time: 08/03/14  8:15 PM  Result Value Ref Range Status   Specimen Description BLOOD RIGHT ANTECUBITAL  Final   Special Requests BOTTLES DRAWN AEROBIC AND ANAEROBIC 5CC  Final   Culture  Setup Time   Final    08/04/2014 03:11 Performed at [facility]    Culture   Final           BLOOD CULTURE RECEIVED NO GROWTH TO DATE CULTURE WILL BE HELD FOR 5 DAYS BEFORE ISSUING A FINAL NEGATIVE REPORT Performed at [facility]    Report Status PENDING  Incomplete  Blood Culture (routine x 2)     Status: None (Preliminary result)   Collection Time: 08/03/14  8:50 PM  Result Value Ref Range Status   Specimen Description BLOOD R FA  Final   Special Requests BOTTLES DRAWN AEROBIC AND ANAEROBIC 5CC  Final   Culture  Setup Time   Final    08/04/2014 03:11 Performed at [facility]    Culture   Final           BLOOD CULTURE RECEIVED NO GROWTH TO DATE CULTURE WILL BE HELD FOR 5 DAYS BEFORE ISSUING A FINAL NEGATIVE REPORT Performed at [facility]    Report Status PENDING  Incomplete     Studies: No results found.  Scheduled Meds: . antiseptic oral rinse  15 mL Mouth Rinse 4 times per day  . aspirin  325 mg Oral Daily  . docusate sodium  100 mg Oral BID  . enoxaparin (LOVENOX) injection  40 mg Subcutaneous Q24H  . famciclovir  500 mg Oral Daily  . sodium chloride  3 mL Intravenous Q12H    Continuous Infusions:     Time spent: 25 minutes    Samantha Moyer, [place]  [facility] Pager [phone]. If 7PM-7AM, please contact night-coverage at [email or web address], password [facility] 08/07/2014, 3:46 PM  LOS: 4 days

## 2014-08-07 NOTE — Progress Notes (Signed)
[facility] Therapy Treatment Record Phone [phone]   Radiation Therapy was administered to Samantha Moyer on: 08/07/2014  1:59 PM and was treatment # 6 out of a planned course of 10 treatments.

## 2014-08-07 NOTE — Progress Notes (Signed)
Pt refused lab to draw blood for Type and screen. Paged MD but no return call.

## 2014-08-07 NOTE — Progress Notes (Signed)
She has been afebrile. All cultures have been negative. She's doing well with radiation for the left arm.  She does have a PICC line put in. I will like to avoid this. I don't think we have to get a PICC line into her. I don't have her lab work back yet today. However, she is eating. She stricken okay.  Pain seems better good control.  She's had no problems with bowels or bladder. There's been no nausea or vomiting.  She's had no problems with rashes. There is no leg swelling.  She's had no headache. There's been no swallowing problems. There has been no mouth sores.  Her vital signs are stable. Blood pressure is a little on the lower side. She is a symptomatically with this. Her lungs are clear. Cardiac exam regular rate and rhythm. There are no murmurs. Abdomen is soft. She has good bowel sounds. There is no fluid wave. Extremities shows no clubbing, cyanosis or edema. Neurological exam is nonfocal  Everything so far, looks pretty stable. I think we'll probably hold on the antibiotics. I'll keep her on the Famvir. Again her cultures are negative.  I wonder if she might be able to go home. It would be nice to have had her lab work back when I saw her this morning but that just is not the case.  Again, I think we can try to avoid a PICC line into her.  I just wonder if she did not have a reaction to the Zometa that we gave her. It is certain possible this may have been the source for the fever.  Hopefully, she will be able to go home. She is eating and drinking okay. We can easily follow her up in the outpatient office. She will be getting radiation daily so if there is any issues she gets early be seen in radiation oncology.  Appreciate all the great help that she received up on 5 W.  Lum Keas  Psalm 119:10

## 2014-08-07 NOTE — Progress Notes (Signed)
INITIAL NUTRITION ASSESSMENT  DOCUMENTATION CODES Per approved criteria  -Severe malnutrition in the context of chronic illness  Pt meets criteria for severe MALNUTRITION in the context of chronic illness as evidenced by severe muscle depletion, energy intake <75% for >1 month, and 7% weight loss x 4 days.  INTERVENTION: -Provide Resource Breeze po BID, each supplement provides 250 kcal and 9 grams of protein -Provide Snacks TID -Encourage PO intake -RD to continue to monitor  NUTRITION DIAGNOSIS: Inadequate oral intake related to decreased appetite as evidenced by poor PO intake 20%.   Goal: Pt to meet >/= 90% of their estimated nutrition needs   Monitor:  PO and supplemental intake, weight, labs, I/O's  Reason for Assessment: Consult for nutritional assessment  Admitting Dx: Multiple myeloma involving the left humerus  ASSESSMENT: 48 year old female with history of multiple myeloma undergoing chemotherapy (plasma cell myeloma as per biopsy), pathological fracture of left humerus status post surgery in November 2015.   Pt reports poor appetite and not being able to eat very much at a time. PO intake: 20%, didn't eat lunch today. Pt with many questions for RD. Caregiver present in room. Pt wonders what foods will be best for her to eat to prevent weight loss and muscle loss. RD answered any questions pt had.  Pt with 9 lb weight loss since admission (7% wt loss x 4 days, significant for time frame). Pt had radiation therapy today.   Pt states that it is easier to consume chilled foods. RD provided [employer] supplement for pt to try and pt enjoyed it. RD to order BID. Pt is interested in eating small meals throughout the day. RD to order snacks for in between meals.  Nutrition Focused Physical Exam:  Subcutaneous Fat:  Orbital Region: moderate depletion Upper Arm Region: mild depletion Thoracic and Lumbar Region: NA  Muscle:  Temple Region: severe  depletion Clavicle Bone Region: mild depletion Clavicle and Acromion Bone Region: mild depletion Scapular Bone Region: mild depletion Dorsal Hand: WNL Patellar Region: WNL Anterior Thigh Region: WNL Posterior Calf Region: WNL  Edema: no LE edema  Labs reviewed: Low Na & K Low BUN  Height: Ht Readings from Last 1 Encounters:  08/04/14 5' 4.5" (1.638 m)    Weight: Wt Readings from Last 1 Encounters:  08/07/14 128 lb 9.6 oz (58.333 kg)    Ideal Body Weight: 120 lb  % Ideal Body Weight: 107%  Wt Readings from Last 10 Encounters:  08/07/14 128 lb 9.6 oz (58.333 kg)  08/05/14 137 lb (62.143 kg)  07/24/14 138 lb 14.4 oz (63.005 kg)  07/21/14 138 lb (62.596 kg)  07/04/14 141 lb (63.957 kg)  07/03/14 141 lb (63.957 kg)  07/02/14 141 lb (63.957 kg)  07/02/14 141 lb (63.957 kg)  06/27/14 142 lb 9.6 oz (64.683 kg)    Usual Body Weight: 137 lb  % Usual Body Weight: 93%  BMI:  Body mass index is 21.74 kg/(m^2).  Estimated Nutritional Needs: Kcal: 0160-1093 Protein: 80-90g Fluid: 1.8L/day  Skin: intact   Diet Order: Diet regular  EDUCATION NEEDS: -Education needs addressed  No intake or output data in the 24 hours ending 08/07/14 1617  Last BM: 12/12   Labs:   Recent Labs Lab 08/04/14 0545 08/05/14 0455 08/06/14 0429  NA 139 136* 134*  K 3.6* 3.3* 3.4*  CL 103 99 96  CO2 22 22 23   BUN 7 7 5*  CREATININE 0.63 0.75 0.79  CALCIUM 7.8* 7.5* 7.6*  GLUCOSE 98 100*  94    CBG (last 3)  No results for input(s): GLUCAP in the last 72 hours.  Scheduled Meds: . antiseptic oral rinse  15 mL Mouth Rinse 4 times per day  . aspirin  325 mg Oral Daily  . docusate sodium  100 mg Oral BID  . enoxaparin (LOVENOX) injection  40 mg Subcutaneous Q24H  . famciclovir  500 mg Oral Daily  . [START ON 08/08/2014] feeding supplement (RESOURCE BREEZE)  1 Container Oral BID BM  . sodium chloride  3 mL Intravenous Q12H    Continuous Infusions:   Past Medical History   Diagnosis Date  . Scleroderma     Tobie Lords  . IgA myeloma 07/21/2014  . Raynaud disease     Past Surgical History  Procedure Laterality Date  . Nasal septum surgery    . Uterine fibroid embolization      Clayton Bibles, MS, RD, LDN Pager: [phone] After Hours Pager: [phone]

## 2014-08-07 NOTE — Evaluation (Signed)
Physical Therapy Evaluation Patient Details Name: Samantha Moyer MRN: [id number] DOB: 02/23/1966 Today's Date: 08/07/2014   History of Present Illness    48 year old female with history of multiple myeloma undergoing chemotherapy (plasma cell myeloma as per biopsy), pathological fracture of left humerus status post surgery in November 2015 presented to the hospital on 08/03/2014 with fever of 101.9F, tachypnea meeting criteria for service possibly due to right lower extremity cellulitis. Patient placed on empiric antibiotics with gradual improvement  Clinical Impression  Pt very motivated and eager to mobilize.  Pt currently requiring min assist for mobility 2* L UE and R hip discomfort.  Pt states she was to start therapy on shoulder but was admitted to hospital.  Pt states she had good support system (but not 24/7) and  prefers to follow up with out pt therapy.  Pt also states she will have friend bring in sling from home to comfort with ambulation.  Pt could benefit from OT order while in hospital.    Follow Up Recommendations No PT follow up    Equipment Recommendations  None recommended by PT    Recommendations for Other Services OT consult     Precautions / Restrictions Precautions Precautions: Fall Precaution Comments: Pathologic fx L shoulder 11/15; R hip biopsy 08/04/14 Required Braces or Orthoses: Sling (Pt states she does not have to wear sling unless in crowds.) Restrictions Weight Bearing Restrictions: No Other Position/Activity Restrictions: Pt unclear on full  limitations of L UE.  States she was to start therapy on shoulder just prior to admit.  Have requested OT evaluation.      Mobility  Bed Mobility Overal bed mobility: Needs Assistance Bed Mobility: Supine to Sit;Sit to Supine     Supine to sit: Min assist Sit to supine: Min guard   General bed mobility comments: cues for sequence   Transfers Overall transfer level: Needs assistance Equipment used:  None Transfers: Sit to/from Stand Sit to Stand: Min assist         General transfer comment: cues for transition position and use of R UE to self assist  Ambulation/Gait Ambulation/Gait assistance: Min assist Ambulation Distance (Feet): 150 Feet (twice) Assistive device: None;Straight cane Gait Pattern/deviations: Step-through pattern;Decreased step length - right;Decreased step length - left;Shuffle;Trunk flexed Gait velocity: decr   General Gait Details: Mild general instability with pt supporting L UE with R and slight favoring of R LE 2* discomfort.  Stairs            Wheelchair Mobility    Modified Rankin (Stroke Patients Only)       Balance                                             Pertinent Vitals/Pain Pain Assessment: 0-10 Pain Score: 7  Pain Location: L UE Pain Descriptors / Indicators: Aching Pain Intervention(s): Limited activity within patient's tolerance;Monitored during session;Patient requesting pain meds-RN notified    Home Living Family/patient expects to be discharged to:: Private residence Living Arrangements: Alone Available Help at Discharge: Friend(s);Available PRN/intermittently Type of Home: Apartment Home Access: Level entry     Home Layout: One level Home Equipment: None      Prior Function Level of Independence: Independent               Hand Dominance   Dominant Hand: Right    Extremity/Trunk Assessment   Upper  Extremity Assessment: LUE deficits/detail       LUE Deficits / Details: Not tested - pt unclear of limitations since pathologic fx   Lower Extremity Assessment: RLE deficits/detail RLE Deficits / Details: Rom and strength WFL but with increased discomfort with attempted abd    Cervical / Trunk Assessment: Normal  Communication   Communication: No difficulties  Cognition Arousal/Alertness: Awake/alert Behavior During Therapy: WFL for tasks assessed/performed Overall Cognitive  Status: Within Functional Limits for tasks assessed                      General Comments      Exercises        Assessment/Plan    PT Assessment Patient needs continued PT services  PT Diagnosis Difficulty walking;Acute pain   PT Problem List Decreased activity tolerance;Decreased strength;Decreased range of motion;Decreased balance;Decreased mobility;Decreased knowledge of use of DME;Pain  PT Treatment Interventions DME instruction;Gait training;Functional mobility training;Therapeutic activities;Therapeutic exercise;Balance training;Patient/family education   PT Goals (Current goals can be found in the Care Plan section) Acute Rehab PT Goals Patient Stated Goal: HOME PT Goal Formulation: With patient Time For Goal Achievement: 08/21/14 Potential to Achieve Goals: Good    Frequency Min 3X/week   Barriers to discharge Decreased caregiver support Does not have 24/7 per pt    Co-evaluation               End of Session Equipment Utilized During Treatment: Gait belt Activity Tolerance: Patient limited by fatigue;Patient limited by pain Patient left: in bed;with call bell/phone within reach Nurse Communication: Mobility status         Time: 1124-1205 PT Time Calculation (min) (ACUTE ONLY): 41 min   Charges:   PT Evaluation $Initial PT Evaluation Tier I: 1 Procedure PT Treatments $Gait Training: 23-37 mins   PT G Codes:          Daouda Lonzo 08/07/2014, 1:24 PM

## 2014-08-08 ENCOUNTER — Encounter: Payer: Self-pay | Admitting: *Deleted

## 2014-08-08 ENCOUNTER — Ambulatory Visit
Admission: RE | Admit: 2014-08-08 | Discharge: 2014-08-08 | Disposition: A | Discharge status: Home / Self Care | Payer: BC Managed Care – PPO | Source: Ambulatory Visit | Attending: Radiation Oncology | Admitting: Radiation Oncology

## 2014-08-08 DIAGNOSIS — A419 Sepsis, unspecified organism: Principal | ICD-10-CM

## 2014-08-08 DIAGNOSIS — E43 Unspecified severe protein-calorie malnutrition: Secondary | ICD-10-CM | POA: Diagnosis present

## 2014-08-08 MED ORDER — BOOST / RESOURCE BREEZE PO LIQD: 1.0000 | Freq: Two times a day (BID) | ORAL | Status: DC

## 2014-08-08 NOTE — Progress Notes (Signed)
Patient requested tele box remain off

## 2014-08-08 NOTE — Discharge Summary (Signed)
Physician Discharge Summary  Samantha Moyer NTI:[id number] DOB: 1965-10-21 DOA: 08/03/2014  PCP: Jenny Reichmann, MD  Admit date: 08/03/2014 Discharge date: 08/08/2014  Time spent: 30 minutes  Recommendations for Outpatient Follow-up:  D/c home with outpt PCP and oncology follow up  Discharge Diagnoses:   Principle problem SIRS  Active Problems:   Lesion of left shoulder   IgA myeloma   Fever   Shoulder pain   Protein-calorie malnutrition, severe   Discharge Condition: fair  Diet recommendation: regular with supplements  Filed Weights   08/05/14 0456 08/07/14 0545 08/08/14 0531  Weight: 59.1 kg (130 lb 4.7 oz) 58.333 kg (128 lb 9.6 oz) 59.058 kg (130 lb 3.2 oz)    History of present illness:  Brief narrative 48 year old female with history of multiple myeloma undergoing chemotherapy (plasma cell myeloma as per biopsy), pathological fracture of left humerus status post surgery in November 2015 presented to the hospital on 08/03/2014 with fever of 101.34F, tachypnea meeting criteria for service possibly due to right lower extremity cellulitis. Patient placed on empiric antibiotics with gradual improvement.   Hospital Course:  SIRS Possible etiology is a right thighRash versus recent chemotherapy.. Patient also had right left shoulder pain and tenderness for which MRI of the left shoulder was done which is negative for any infection. -Patient has been afebrile for the past 3 days. Cultures have been negative. -Antibiotics discontinued  Anemia and thrombocytopenia Secondary to myeloma. No need for transfusion at this time. Platelets improved  Hyponatremia Resolved with hydration  IgA Multiple myeloma Patient has received first cycle of chemotherapy on 12/9 Velcade/Revlimid/Decadron/Zometa  Currently getting radiation therapy. Oncology follow-up appreciated  Left shoulder pain No signs of infection on MRI. Recent surgery of left shoulder for pathological  fracture. Lighted lesions on MRI appears stable and unchanged. Continue radiation therapy.  Pain worsened with movement. continue home dose dilaudid.  Protein calorie malnutrition Added supplements  Hypokalemia replenished       Code Status: full code Family Communication: sopke with patient's aunt on the phone Disposition Plan: home with outpt oncology follow up   Consultants:  oncology  Procedures:  radiation therpay  Antibiotics:  levaquin 12/13>>> 12/17  Vancomycin 12/13>>> 12/17      Discharge Exam: Filed Vitals:   08/08/14 0531  BP: 88/54  Pulse: 80  Temp: 98.6 F (37 C)  Resp: 18    General: Middle aged thin built female in no acute distress  HEENT: no pallor, moist mucosa  chest: clear b/l  CVS: NS1&S2, no murmurs  Abd: soft, NT, N,D BS+  Ext: warm, swelling over rt shoulder with tenderness and restricted ROM, rash over rt thigh resolved  CNS: alert and oriented  Discharge Instructions You were cared for by a hospitalist during your hospital stay. If you have any questions about your discharge medications or the care you received while you were in the hospital after you are discharged, you can call the unit and asked to speak with the hospitalist on call if the hospitalist that took care of you is not available. Once you are discharged, your primary care physician will handle any further medical issues. Please note that NO REFILLS for any discharge medications will be authorized once you are discharged, as it is imperative that you return to your primary care physician (or establish a relationship with a primary care physician if you do not have one) for your aftercare needs so that they can reassess your need for medications and monitor your lab values.  Current Discharge Medication List    START taking these medications   Details  feeding supplement, RESOURCE BREEZE, (RESOURCE BREEZE) LIQD Take 1 Container by mouth 2 (two) times daily  between meals. Qty: 60 Container, Refills: 0      CONTINUE these medications which have NOT CHANGED   Details  aspirin 325 MG EC tablet Take 325 mg by mouth daily.    dexamethasone (DECADRON) 4 MG tablet Take 5 pills once a week with food Qty: 80 tablet, Refills: 3   Associated Diagnoses: IgA myeloma    famciclovir (FAMVIR) 500 MG tablet Take 1 pill a day Qty: 30 tablet, Refills: 6   Associated Diagnoses: IgA myeloma    hyaluronate sodium (RADIAPLEXRX) GEL Apply 1 application topically once.    HYDROmorphone (DILAUDID) 2 MG tablet Take 1 tablet (2 mg total) by mouth every 4 (four) hours as needed for severe pain. Qty: 180 tablet, Refills: 0   Associated Diagnoses: IgA myeloma    LORazepam (ATIVAN) 0.5 MG tablet Take 1 tablet (0.5 mg total) by mouth every 6 (six) hours as needed (Nausea or vomiting). Qty: 30 tablet, Refills: 0   Associated Diagnoses: IgA myeloma    Multiple Vitamins-Minerals (ALIVE WOMENS ENERGY) TABS Take 1 tablet by mouth daily.    ondansetron (ZOFRAN) 8 MG tablet Take 1 tablet (8 mg total) by mouth 2 (two) times daily. Start the day after chemo for 2 days. Then take as needed for nausea or vomiting. Qty: 30 tablet, Refills: 1   Associated Diagnoses: IgA myeloma    PRESCRIPTION MEDICATION Dr. Marin Olp Comes on Wednesdays for treatment at the Cancer center    prochlorperazine (COMPAZINE) 10 MG tablet Take 1 tablet (10 mg total) by mouth every 6 (six) hours as needed (Nausea or vomiting). Qty: 30 tablet, Refills: 1   Associated Diagnoses: IgA myeloma    lenalidomide (REVLIMID) 25 MG capsule Take 1 pill at nighttime for 21 days on and 7 days off. Auth # [id number] Qty: 21 capsule, Refills: 0   Associated Diagnoses: IgA myeloma       Allergies  Allergen Reactions  . Codeine Nausea Only  . Tramadol Nausea And Vomiting and Other (See Comments)   Follow-up Information    Follow up with Volanda Napoleon, MD On 08/13/2014.   Specialty:  Oncology   Contact  information:   [place], [address] [place] [address] [phone]        The results of significant diagnostics from this hospitalization (including imaging, microbiology, ancillary and laboratory) are listed below for reference.    Significant Diagnostic Studies: Mr Shoulder Left Wo Contrast  08/05/2014   CLINICAL DATA:  Left shoulder mass on previous MRI. Subsequent humeral fixation and fever. Evaluate for source of infection. Initial encounter.  EXAM: MRI OF THE LEFT SHOULDER WITHOUT CONTRAST  TECHNIQUE: Multiplanar, multisequence MR imaging of the shoulder was performed. No intravenous contrast was administered.  COMPARISON:  Radiographs 06/27/2014 and MRI 07/03/2014.  FINDINGS: Protocol was altered due to the previous surgery and indication. Patient has undergone interval left humeral intramedullary nail fixation. The hardware creates moderate susceptibility artifact. 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 The primary component measures approximately 2.4 x 3.4 x 4.9 cm, although are additional lobulated components extending more inferiorly and posteriorly, best seen on the sagittal images. No gross changes are identified, although the surrounding cortex is largely destroyed with a probable underlying pathologic fracture.  Rotator cuff: Intact with  stable mild supraspinatus and infraspinous tendinosis.  Muscles: There is mild generalized T2 hyperintensity within the muscles surrounding the shoulder, greatest within the teres major muscle.  Biceps long head:  Intact.  Acromioclavicular Joint: Stable mild acromioclavicular degenerative changes. No significant fluid in the subacromial -subdeltoid bursa.  Glenohumeral Joint: No significant joint effusion or glenohumeral arthropathy. No focal periarticular fluid collections are identified.  Labrum:  Stable without gross abnormality.  Bones: As above, large lytic lesion  involving the proximal humerus status post intramedullary nail fixation. No definite lesions are demonstrated within the visualized scapula or clavicle.  Other: Prominent axillary lymph nodes are similar to the prior examination.  IMPRESSION: 1. Interval left humeral intramedullary nail fixation for pathologic fracture. The large lytic lesion involving the proximal left humerus is grossly unchanged. 2. No explanation for fever identified. There is nonspecific edema throughout the musculature surrounding the shoulder, but no focal fluid collection. 3. Stable left axillary adenopathy.   Electronically Signed   By: Camie Patience M.D.   On: 08/05/2014 08:20   Ct Biopsy  08/04/2014   CLINICAL DATA:  IgA myeloma. The patient requires bone marrow biopsy.  EXAM: CT GUIDED BIOPSY OF RIGHT ILIAC BONE MARROW  ANESTHESIA/SEDATION: Versed 2.0 mg IV, Fentanyl 100 mcg IV  Total Moderate Sedation Time  12 minutes.  PROCEDURE: The procedure risks, benefits, and alternatives were explained to the patient. Questions regarding the procedure were encouraged and answered. The patient understands and consents to the procedure.  The right gluteal region was prepped with Betadine. Sterile gown and sterile gloves were used for the procedure. Local anesthesia was provided with 1% Lidocaine.  Under CT guidance, an 11 gauge OnControl bone cutting needle was advanced from a posterior approach into the right iliac bone. Needle positioning was confirmed with CT. Initial non heparinized and heparinized aspirate samples were obtained of bone marrow.  Core biopsy was performed wit through the outer needle.  COMPLICATIONS: None  FINDINGS: Inspection of initial aspirate did reveal visible particles. An intact core biopsy sample was obtained.  IMPRESSION: CT guided bone marrow biopsy of right posterior iliac bone with both aspirate and core samples obtained.   Electronically Signed   By: [name] M.D.   On: 08/04/2014 13:07   Dg Chest Port 1  View  08/03/2014   CLINICAL DATA:  Recent cancer therapy with fever and cough  EXAM: PORTABLE CHEST - 1 VIEW  COMPARISON:  07/04/2014  FINDINGS: Cardiac shadow is within normal limits. The lungs are well aerated bilaterally. No focal confluent infiltrate is noted. Diffuse interstitial changes are again noted. Postsurgical changes are seen in the proximal left humerus. Lytic lesion is again noted in the proximal left humerus.  IMPRESSION: Chronic interstitial changes without acute abnormality.   Electronically Signed   By: Inez Catalina M.D.   On: 08/03/2014 20:02    Microbiology: Recent Results (from the past 240 hour(s))  Urine culture     Status: None   Collection Time: 08/03/14  7:40 PM  Result Value Ref Range Status   Specimen Description URINE, CLEAN CATCH  Final   Special Requests NONE  Final   Culture  Setup Time   Final    08/04/2014 03:28 Performed at [place] Performed at [facility]   Final   Culture NO GROWTH Performed at [facility]   Final   Report Status 08/05/2014 FINAL  Final  Blood Culture (routine x 2)  Status: None (Preliminary result)   Collection Time: 08/03/14  8:15 PM  Result Value Ref Range Status   Specimen Description BLOOD RIGHT ANTECUBITAL  Final   Special Requests BOTTLES DRAWN AEROBIC AND ANAEROBIC 5CC  Final   Culture  Setup Time   Final    08/04/2014 03:11 Performed at [facility]    Culture   Final           BLOOD CULTURE RECEIVED NO GROWTH TO DATE CULTURE WILL BE HELD FOR 5 DAYS BEFORE ISSUING A FINAL NEGATIVE REPORT Performed at [facility]    Report Status PENDING  Incomplete  Blood Culture (routine x 2)     Status: None (Preliminary result)   Collection Time: 08/03/14  8:50 PM  Result Value Ref Range Status   Specimen Description BLOOD R FA  Final   Special Requests BOTTLES DRAWN AEROBIC AND ANAEROBIC 5CC  Final   Culture  Setup Time   Final    08/04/2014  03:11 Performed at [facility]    Culture   Final           BLOOD CULTURE RECEIVED NO GROWTH TO DATE CULTURE WILL BE HELD FOR 5 DAYS BEFORE ISSUING A FINAL NEGATIVE REPORT Performed at [facility]    Report Status PENDING  Incomplete     Labs: Basic Metabolic Panel:  Recent Labs Lab 08/03/14 2030 08/04/14 0545 08/05/14 0455 08/06/14 0429  NA 133* 139 136* 134*  K 3.9 3.6* 3.3* 3.4*  CL 96 103 99 96  CO2 20 22 22 23   GLUCOSE 94 98 100* 94  BUN 9 7 7  5*  CREATININE 0.70 0.63 0.75 0.79  CALCIUM 8.3* 7.8* 7.5* 7.6*   Liver Function Tests:  Recent Labs Lab 08/03/14 2030 08/04/14 0545 08/05/14 0455  AST 34 28 54*  ALT 20 21 25   ALKPHOS 78 67 64  BILITOT 0.4 0.5 0.4  PROT 9.5* 8.5* 7.9  ALBUMIN 2.5* 2.3* 2.1*   No results for input(s): LIPASE, AMYLASE in the last 168 hours. No results for input(s): AMMONIA in the last 168 hours. CBC:  Recent Labs Lab 08/03/14 2030 08/04/14 0545 08/05/14 0455 08/06/14 0429  WBC 6.2 7.8 5.5 7.3  NEUTROABS 5.8 7.0  --   --   HGB 9.5* 8.9* 8.8* 9.2*  HCT 30.3* 29.0* 28.7* 29.7*  MCV 96.5 96.7 97.0 95.2  PLT 124* 221 176 177   Cardiac Enzymes: No results for input(s): CKTOTAL, CKMB, CKMBINDEX, TROPONINI in the last 168 hours. BNP: BNP (last 3 results) No results for input(s): PROBNP in the last 8760 hours. CBG: No results for input(s): GLUCAP in the last 168 hours.     SignedLouellen Molder  [facility] 08/08/2014, 11:56 AM

## 2014-08-08 NOTE — Progress Notes (Signed)
Had [employer]" check pt had medications. Irven Baltimore, RN

## 2014-08-08 NOTE — Progress Notes (Signed)
Gave pt back her medications that was being stored down in pharmacy per policy.  Irven Baltimore, RN

## 2014-08-08 NOTE — Progress Notes (Signed)
[facility] Work  Clinical Social Work met with pt while in hospital to check in, offer support and for assessment of psychosocial needs.  Pt reports her friends continue to support her greatly and will help her when she gets home from the hospital. Pt continues to have financial concerns and CSW reviewed resources to assist. Pt needed to process her illness and share her emotions. CSW provided supportive listening and support. Pt still eager to complete ADRs, but did not have her glasses today and could not see well. CSW to follow and will try to complete ADRs at future appt at the [facility]. Pt provided with resources for additional support. Pt aware and agrees to seek out CSW as needed.    Clinical Social Work interventions: Emotional support Resource education  Loren Racer,  Worker [place]  [place] Phone: [phone] Fax: [phone]

## 2014-08-08 NOTE — Progress Notes (Signed)
Samantha Moyer, surprisingly, is still in the hospital. She feels okay. She wants to go home. She has some hypertension but seems to be asymptomatic with this.  She's doing well with radiation therapy.  She was seen by physical therapy but they did not feel that she needed any type of follow-up or outpatient physical therapy.  Her appetite is doing okay. She's having no nausea vomiting.  She's been afebrile.  No laboratory is back yet.  There is no diarrhea.  She's not having any problems pain wise.  Her vital signs show blood pressure of 88/54. Heart rate is 80. Temperature is 98.6. Oral exam is without mucositis. She has clear lungs. Cardiac exam regular rate and rhythm. Abdomen is soft. She has good bowel sounds. Extremities shows no clubbing, cyanosis or edema in the legs. She has the surgical repair scars on the left upper arm.  Hopefully, she will be able to go home today. Again she wants to go home. She is off IV fluid. It would be interesting to see what her lab work shows.  Her appreciate all the great care that she has gotten and is getting up on 5 E.  Pete E.  Col 3:17

## 2014-08-10 LAB — CULTURE, BLOOD (ROUTINE X 2)
Culture: NO GROWTH
Culture: NO GROWTH

## 2014-08-11 ENCOUNTER — Ambulatory Visit
Admission: RE | Admit: 2014-08-11 | Discharge: 2014-08-11 | Disposition: A | Discharge status: Home / Self Care | Payer: BC Managed Care – PPO | Source: Ambulatory Visit | Attending: Radiation Oncology | Admitting: Radiation Oncology

## 2014-08-11 DIAGNOSIS — Z510 Encounter for antineoplastic radiation therapy: Secondary | ICD-10-CM | POA: Diagnosis present

## 2014-08-11 DIAGNOSIS — C9000 Multiple myeloma not having achieved remission: Secondary | ICD-10-CM | POA: Diagnosis not present

## 2014-08-12 ENCOUNTER — Ambulatory Visit
Admit: 2014-08-12 | Discharge: 2014-08-12 | Disposition: A | Discharge status: Home / Self Care | Payer: BC Managed Care – PPO | Attending: Radiation Oncology | Admitting: Radiation Oncology

## 2014-08-12 ENCOUNTER — Ambulatory Visit
Admission: RE | Admit: 2014-08-12 | Discharge: 2014-08-12 | Disposition: A | Discharge status: Home / Self Care | Payer: BC Managed Care – PPO | Source: Ambulatory Visit | Attending: Radiation Oncology | Admitting: Radiation Oncology

## 2014-08-12 ENCOUNTER — Encounter: Payer: Self-pay | Admitting: Radiation Oncology

## 2014-08-12 VITALS — BP 102/62 | HR 92 | Temp 97.7°F | Resp 20 | Ht 64.5 in | Wt 142.7 lb

## 2014-08-12 DIAGNOSIS — Z510 Encounter for antineoplastic radiation therapy: Secondary | ICD-10-CM | POA: Diagnosis not present

## 2014-08-12 DIAGNOSIS — C9000 Multiple myeloma not having achieved remission: Secondary | ICD-10-CM

## 2014-08-12 NOTE — Progress Notes (Signed)
  Radiation Oncology         [phone] ________________________________  Name: Morgane Joerger MRN: [id number]  Date: 08/12/2014  DOB: 28-Oct-1965  Weekly Radiation Therapy Management  DIAGNOSIS: Multiple myeloma involving the left humerus  Current Dose: 27 Gy     Planned Dose:  30 Gy  Narrative . . . . . . . . The patient presents for routine under treatment assessment.                                   The patient has noticed improvement in her pain in the left shoulder and left upper arm. She only takes Dilaudid at night.                                  Set-up films were reviewed.                                 The chart was checked. Physical Findings. . .  height is 5' 4.5" (1.638 m) and weight is 142 lb 11.2 oz (64.728 kg). Her oral temperature is 97.7 F (36.5 C). Her blood pressure is 102/62 and her pulse is 92. Her respiration is 20. . Mild hyperpigmentation changes noted in the left proximal arm. No signs of infection. Impression . . . . . . . The patient is tolerating radiation. Plan . . . . . . . . . . . . Continue treatment as planned.  ________________________________   Blair Promise, PhD, MD

## 2014-08-12 NOTE — Progress Notes (Signed)
Samantha Moyer has completed 9 fractions to her left humerus.  She reports the pain in her left shoulder/arm is much better.  She rates it at a 2/10 today.  She only takes dilaudid at night if needed.  She denies having any fevers.  She reports a good appetite and has gained 12 bls since 12/18.  She has hyperpigmentation on her left arm.  She is using radiaplex gel.   She reports fatigue.  She has been given a one month follow up appointment card.

## 2014-08-13 ENCOUNTER — Ambulatory Visit (HOSPITAL_BASED_OUTPATIENT_CLINIC_OR_DEPARTMENT_OTHER)
Admission: RE | Admit: 2014-08-13 | Discharge: 2014-08-13 | Disposition: A | Discharge status: Home / Self Care | Payer: BC Managed Care – PPO | Source: Ambulatory Visit | Attending: Hematology & Oncology | Admitting: Hematology & Oncology

## 2014-08-13 ENCOUNTER — Ambulatory Visit (HOSPITAL_BASED_OUTPATIENT_CLINIC_OR_DEPARTMENT_OTHER): Payer: BC Managed Care – PPO | Admitting: Hematology & Oncology

## 2014-08-13 ENCOUNTER — Ambulatory Visit (HOSPITAL_BASED_OUTPATIENT_CLINIC_OR_DEPARTMENT_OTHER): Payer: BC Managed Care – PPO

## 2014-08-13 ENCOUNTER — Other Ambulatory Visit: Payer: Self-pay | Admitting: Hematology & Oncology

## 2014-08-13 ENCOUNTER — Ambulatory Visit (HOSPITAL_BASED_OUTPATIENT_CLINIC_OR_DEPARTMENT_OTHER): Payer: BC Managed Care – PPO | Admitting: Lab

## 2014-08-13 ENCOUNTER — Ambulatory Visit
Admission: RE | Admit: 2014-08-13 | Discharge: 2014-08-13 | Disposition: A | Discharge status: Home / Self Care | Payer: BC Managed Care – PPO | Source: Ambulatory Visit | Attending: Radiation Oncology | Admitting: Radiation Oncology

## 2014-08-13 DIAGNOSIS — M25561 Pain in right knee: Secondary | ICD-10-CM | POA: Diagnosis present

## 2014-08-13 DIAGNOSIS — M7989 Other specified soft tissue disorders: Secondary | ICD-10-CM | POA: Insufficient documentation

## 2014-08-13 DIAGNOSIS — Z5112 Encounter for antineoplastic immunotherapy: Secondary | ICD-10-CM

## 2014-08-13 DIAGNOSIS — C9000 Multiple myeloma not having achieved remission: Secondary | ICD-10-CM

## 2014-08-13 DIAGNOSIS — Z8579 Personal history of other malignant neoplasms of lymphoid, hematopoietic and related tissues: Secondary | ICD-10-CM | POA: Diagnosis not present

## 2014-08-13 DIAGNOSIS — Z510 Encounter for antineoplastic radiation therapy: Secondary | ICD-10-CM | POA: Diagnosis not present

## 2014-08-13 LAB — CBC WITH DIFFERENTIAL (CANCER CENTER ONLY)
BASO#: 0 10*3/uL (ref 0.0–0.2)
BASO%: 0.1 % (ref 0.0–2.0)
EOS%: 1 % (ref 0.0–7.0)
Eosinophils Absolute: 0.1 10*3/uL (ref 0.0–0.5)
HCT: 30.9 % — ABNORMAL LOW (ref 34.8–46.6)
HEMOGLOBIN: 9.5 g/dL — AB (ref 11.6–15.9)
LYMPH#: 0.4 10*3/uL — AB (ref 0.9–3.3)
LYMPH%: 3.3 % — ABNORMAL LOW (ref 14.0–48.0)
MCH: 30 pg (ref 26.0–34.0)
MCHC: 30.7 g/dL — ABNORMAL LOW (ref 32.0–36.0)
MCV: 98 fL (ref 81–101)
MONO#: 1.9 10*3/uL — ABNORMAL HIGH (ref 0.1–0.9)
MONO%: 15.2 % — ABNORMAL HIGH (ref 0.0–13.0)
NEUT%: 80.4 % — ABNORMAL HIGH (ref 39.6–80.0)
NEUTROS ABS: 9.8 10*3/uL — AB (ref 1.5–6.5)
Platelets: 445 10*3/uL — ABNORMAL HIGH (ref 145–400)
RBC: 3.17 10*6/uL — ABNORMAL LOW (ref 3.70–5.32)
RDW: 13.7 % (ref 11.1–15.7)
WBC: 12.2 10*3/uL — AB (ref 3.9–10.0)

## 2014-08-13 LAB — CMP (CANCER CENTER ONLY)
ALBUMIN: 2.8 g/dL — AB (ref 3.3–5.5)
ALT(SGPT): 29 U/L (ref 10–47)
AST: 24 U/L (ref 11–38)
Alkaline Phosphatase: 69 U/L (ref 26–84)
BUN, Bld: 6 mg/dL — ABNORMAL LOW (ref 7–22)
CALCIUM: 8.7 mg/dL (ref 8.0–10.3)
CHLORIDE: 94 meq/L — AB (ref 98–108)
CO2: 30 mEq/L (ref 18–33)
Creat: 0.4 mg/dl — ABNORMAL LOW (ref 0.6–1.2)
Glucose, Bld: 124 mg/dL — ABNORMAL HIGH (ref 73–118)
POTASSIUM: 3.1 meq/L — AB (ref 3.3–4.7)
Sodium: 138 mEq/L (ref 128–145)
Total Bilirubin: 0.8 mg/dl (ref 0.20–1.60)
Total Protein: 8.4 g/dL — ABNORMAL HIGH (ref 6.4–8.1)

## 2014-08-13 LAB — CHROMOSOME ANALYSIS, BONE MARROW

## 2014-08-13 LAB — TISSUE HYBRIDIZATION (BONE MARROW)-NCBH

## 2014-08-13 MED ORDER — ONDANSETRON HCL 8 MG PO TABS
ORAL_TABLET | ORAL | Status: AC
  Filled 2014-08-13: qty 1

## 2014-08-13 MED ORDER — BORTEZOMIB CHEMO SQ INJECTION 3.5 MG (2.5MG/ML)
1.3000 mg/m2 | Freq: Once | INTRAMUSCULAR | Status: AC
  Administered 2014-08-13: 2.25 mg via SUBCUTANEOUS
  Filled 2014-08-13: qty 2.25

## 2014-08-13 MED ORDER — PANTOPRAZOLE SODIUM 40 MG PO TBEC: 40.0000 mg | DELAYED_RELEASE_TABLET | Freq: Every day | ORAL | Status: DC

## 2014-08-13 MED ORDER — ONDANSETRON HCL 8 MG PO TABS
8.0000 mg | ORAL_TABLET | Freq: Once | ORAL | Status: AC
  Administered 2014-08-13: 8 mg via ORAL

## 2014-08-13 NOTE — Patient Instructions (Signed)
Bortezomib injection What is this medicine? BORTEZOMIB (bor TEZ oh mib) is a chemotherapy drug. It slows the growth of cancer cells. This medicine is used to treat multiple myeloma, and certain lymphomas, such as mantle-cell lymphoma. This medicine may be used for other purposes; ask your health care provider or pharmacist if you have questions. COMMON BRAND NAME(S): Velcade What should I tell my health care provider before I take this medicine? They need to know if you have any of these conditions: -diabetes -heart disease -irregular heartbeat -liver disease -on hemodialysis -low blood counts, like low white blood cells, platelets, or hemoglobin -peripheral neuropathy -taking medicine for blood pressure -an unusual or allergic reaction to bortezomib, mannitol, boron, other medicines, foods, dyes, or preservatives -pregnant or trying to get pregnant -breast-feeding How should I use this medicine? This medicine is for injection into a vein or for injection under the skin. It is given by a health care professional in a hospital or clinic setting. Talk to your pediatrician regarding the use of this medicine in children. Special care may be needed. Overdosage: If you think you have taken too much of this medicine contact a poison control center or emergency room at once. NOTE: This medicine is only for you. Do not share this medicine with others. What if I miss a dose? It is important not to miss your dose. Call your doctor or health care professional if you are unable to keep an appointment. What may interact with this medicine? This medicine may interact with the following medications: -ketoconazole -rifampin -ritonavir -St. John's Wort This list may not describe all possible interactions. Give your health care provider a list of all the medicines, herbs, non-prescription drugs, or dietary supplements you use. Also tell them if you smoke, drink alcohol, or use illegal drugs. Some items  may interact with your medicine. What should I watch for while using this medicine? Visit your doctor for checks on your progress. This drug may make you feel generally unwell. This is not uncommon, as chemotherapy can affect healthy cells as well as cancer cells. Report any side effects. Continue your course of treatment even though you feel ill unless your doctor tells you to stop. You may get drowsy or dizzy. Do not drive, use machinery, or do anything that needs mental alertness until you know how this medicine affects you. Do not stand or sit up quickly, especially if you are an older patient. This reduces the risk of dizzy or fainting spells. In some cases, you may be given additional medicines to help with side effects. Follow all directions for their use. Call your doctor or health care professional for advice if you get a fever, chills or sore throat, or other symptoms of a cold or flu. Do not treat yourself. This drug decreases your body's ability to fight infections. Try to avoid being around people who are sick. This medicine may increase your risk to bruise or bleed. Call your doctor or health care professional if you notice any unusual bleeding. You may need blood work done while you are taking this medicine. In some patients, this medicine may cause a serious brain infection that may cause death. If you have any problems seeing, thinking, speaking, walking, or standing, tell your doctor right away. If you cannot reach your doctor, urgently seek other source of medical care. Do not become pregnant while taking this medicine. Women should inform their doctor if they wish to become pregnant or think they might be pregnant. There is   a potential for serious side effects to an unborn child. Talk to your health care professional or pharmacist for more information. Do not breast-feed an infant while taking this medicine. Check with your doctor or health care professional if you get an attack of  severe diarrhea, nausea and vomiting, or if you sweat a lot. The loss of too much body fluid can make it dangerous for you to take this medicine. What side effects may I notice from receiving this medicine? Side effects that you should report to your doctor or health care professional as soon as possible: -allergic reactions like skin rash, itching or hives, swelling of the face, lips, or tongue -breathing problems -changes in hearing -changes in vision -fast, irregular heartbeat -feeling faint or lightheaded, falls -pain, tingling, numbness in the hands or feet -right upper belly pain -seizures -swelling of the ankles, feet, hands -unusual bleeding or bruising -unusually weak or tired -vomiting -yellowing of the eyes or skin Side effects that usually do not require medical attention (report to your doctor or health care professional if they continue or are bothersome): -changes in emotions or moods -constipation -diarrhea -loss of appetite -headache -irritation at site where injected -nausea This list may not describe all possible side effects. Call your doctor for medical advice about side effects. You may report side effects to FDA at 1-800-FDA-1088. Where should I keep my medicine? This drug is given in a hospital or clinic and will not be stored at home. NOTE: This sheet is a summary. It may not cover all possible information. If you have questions about this medicine, talk to your doctor, pharmacist, or health care provider.  2015, Elsevier/Gold Standard. (2013-06-03 12:46:32)  

## 2014-08-14 ENCOUNTER — Telehealth: Payer: Self-pay | Admitting: *Deleted

## 2014-08-14 NOTE — Telephone Encounter (Signed)
-----   Message from Volanda Napoleon, MD sent at 08/13/2014  1:50 PM EST ----- Call her and let her know that the x-ray of the right knee looks okay. No fluid. No fracture. No myeloma. Thanks

## 2014-08-20 NOTE — Progress Notes (Signed)
Hematology and Oncology Follow Up Visit  Samantha Moyer [id number] [birth year] 48 y.o. 08/20/2014   Principle Diagnosis:   IgA lambda myeloma  Current Therapy:    Radiation therapy for pathologic fracture of the left humerus  Velcade/Revlimid/Decadron-patient to start cycle 1  Zometa 4 mg IV every month     Interim History:  Ms.  Moyer is in for a unexpected office visit. She was recently hospitalized because of a high fever. All cultures were negative. She may have had a reaction to the Zometa since that was her first dose. The fever started about 3 days after the Zometa was given area  She is doing okay with radiation. I think she still has about 8 more treatments left.  She is feeling better. She is eating a little bit better.  She's not having any diarrhea. There is no constipation.  She is sleeping well.  she needs to be out of work probably for at least 3-4 months. She will be undergoing some intensive and aggressive therapy so that we can get her myeloma under better control.  The studies that she has had done so far shows that she has a IgA level of 3890 milligrams per deciliter. Her lambda light chain was 421 mg/dL. Her bone marrow biopsy report ([id number]) showed about 85% plasma cells. 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 This certainly would help explain the aggressive disease that she has.  A 24-hour urine showed 4390 milligrams of lambda light chain.  Again, she feels better. Her appetite is better. Her pain control is better.  Overall, her performance status is ECOG 1.  Medications: Current outpatient prescriptions: aspirin 325 MG EC tablet, Take 325 mg by mouth daily., Disp: , Rfl: ;  dexamethasone (DECADRON) 4 MG tablet, Take 5 pills once a week with food (Patient taking differently: Take 20 mg by mouth once a week. Take 5 pills once a week with food), Disp: 80 tablet, Rfl: 3;  famciclovir  (FAMVIR) 500 MG tablet, Take 1 pill a day (Patient taking differently: Take 500 mg by mouth daily. ), Disp: 30 tablet, Rfl: 6 feeding supplement, RESOURCE BREEZE, (RESOURCE BREEZE) LIQD, Take 1 Container by mouth 2 (two) times daily between meals., Disp: 97 Container, Rfl: 0;  hyaluronate sodium (RADIAPLEXRX) GEL, Apply 1 application topically once., Disp: , Rfl: ;  HYDROmorphone (DILAUDID) 2 MG tablet, Take 1 tablet (2 mg total) by mouth every 4 (four) hours as needed for severe pain., Disp: 180 tablet, Rfl: 0 lenalidomide (REVLIMID) 25 MG capsule, Take 1 pill at nighttime for 21 days on and 7 days off. Auth # [id number], Disp: 21 capsule, Rfl: 0;  LORazepam (ATIVAN) 0.5 MG tablet, Take 1 tablet (0.5 mg total) by mouth every 6 (six) hours as needed (Nausea or vomiting). (Patient not taking: Reported on 08/12/2014), Disp: 30 tablet, Rfl: 0;  Multiple Vitamins-Minerals (ALIVE WOMENS ENERGY) TABS, Take 1 tablet by mouth daily., Disp: , Rfl:  ondansetron (ZOFRAN) 8 MG tablet, Take 1 tablet (8 mg total) by mouth 2 (two) times daily. Start the day after chemo for 2 days. Then take as needed for nausea or vomiting. (Patient not taking: Reported on 08/12/2014), Disp: 30 tablet, Rfl: 1;  pantoprazole (PROTONIX) 40 MG tablet, Take 1 tablet (40 mg total) by mouth daily., Disp: 30 tablet, Rfl: 5 PRESCRIPTION MEDICATION, Dr. Marin Olp Comes on Wednesdays for treatment at the Cancer center, Disp: , Rfl: ;  prochlorperazine (COMPAZINE)  10 MG tablet, Take 1 tablet (10 mg total) by mouth every 6 (six) hours as needed (Nausea or vomiting). (Patient not taking: Reported on 08/12/2014), Disp: 30 tablet, Rfl: 1  Allergies:  Allergies  Allergen Reactions  . Codeine Nausea Only  . Tramadol Nausea And Vomiting and Other (See Comments)    Past Medical History, Surgical history, Social history, and Family History were reviewed and updated.  Review of Systems: As above  Physical Exam:  vitals were not taken for this  visit.  [name] in no obvious distress. Head and neck exam shows no ocular or oral lesions. There are no palpable cervical or supraclavicular lymph nodes. Lungs are clear. Cardiac exam regular rate and rhythm. There are no murmurs, rubs or bruits. Abdomen is soft. She is good bowel sounds. There is no fluid wave. There is no palpable liver or spleen tip. Back exam shows no tenderness over the spine, ribs or hips. Extremities shows decreased range motion of the left arm. There is no edema in the legs. She has no weakness in the legs. Skin exam shows no rashes, ecchymoses or petechia. Neurological exam is nonfocal.  Lab Results  Component Value Date   WBC 12.2* 08/13/2014   HGB 9.5* 08/13/2014   HCT 30.9* 08/13/2014   MCV 98 08/13/2014   PLT 445* 08/13/2014     Chemistry      Component Value Date/Time   NA 138 08/13/2014 1026   NA 134* 08/06/2014 0429   K 3.1* 08/13/2014 1026   K 3.4* 08/06/2014 0429   CL 94* 08/13/2014 1026   CL 96 08/06/2014 0429   CO2 30 08/13/2014 1026   CO2 23 08/06/2014 0429   BUN 6* 08/13/2014 1026   BUN 5* 08/06/2014 0429   CREATININE 0.4* 08/13/2014 1026   CREATININE 0.79 08/06/2014 0429      Component Value Date/Time   CALCIUM 8.7 08/13/2014 1026   CALCIUM 7.6* 08/06/2014 0429   ALKPHOS 69 08/13/2014 1026   ALKPHOS 64 08/05/2014 0455   AST 24 08/13/2014 1026   AST 54* 08/05/2014 0455   ALT 29 08/13/2014 1026   ALT 25 08/05/2014 0455   BILITOT 0.80 08/13/2014 1026   BILITOT 0.4 08/05/2014 0455         Impression and Plan: Samantha Moyer is 48 year old African female. She has fairly aggressive IgA lambda myeloma.  We will have to start her on therapy. Again, I think that the hospitalization may been from the [place]. We will have to make sure that the Zometa is run over a longer amount of time.  I spent about 45 minutes with she and her friend.  I went over all of her lab work. I went over her bone  marrow report.  I still feel that she will need a stem cell transplant. We have a ways to go before we can get her to that point.  We will plan to see her back for her regular appointment.   Samantha Napoleon, MD 12/30/20158:32 AM

## 2014-08-21 ENCOUNTER — Encounter: Payer: Self-pay | Admitting: Hematology & Oncology

## 2014-08-21 ENCOUNTER — Encounter (HOSPITAL_COMMUNITY): Payer: Self-pay

## 2014-08-27 ENCOUNTER — Ambulatory Visit (HOSPITAL_BASED_OUTPATIENT_CLINIC_OR_DEPARTMENT_OTHER): Payer: BC Managed Care – PPO | Admitting: Family

## 2014-08-27 ENCOUNTER — Encounter: Payer: Self-pay | Admitting: Family

## 2014-08-27 ENCOUNTER — Ambulatory Visit (HOSPITAL_BASED_OUTPATIENT_CLINIC_OR_DEPARTMENT_OTHER): Payer: BC Managed Care – PPO

## 2014-08-27 ENCOUNTER — Ambulatory Visit (HOSPITAL_BASED_OUTPATIENT_CLINIC_OR_DEPARTMENT_OTHER): Payer: BC Managed Care – PPO | Admitting: Lab

## 2014-08-27 VITALS — BP 106/73 | HR 87 | Temp 97.7°F | Resp 14 | Ht 64.0 in | Wt 140.0 lb

## 2014-08-27 DIAGNOSIS — Z5112 Encounter for antineoplastic immunotherapy: Secondary | ICD-10-CM

## 2014-08-27 DIAGNOSIS — C9000 Multiple myeloma not having achieved remission: Secondary | ICD-10-CM

## 2014-08-27 DIAGNOSIS — E876 Hypokalemia: Secondary | ICD-10-CM

## 2014-08-27 LAB — CMP (CANCER CENTER ONLY)
ALBUMIN: 2.8 g/dL — AB (ref 3.3–5.5)
ALT: 13 U/L (ref 10–47)
AST: 17 U/L (ref 11–38)
Alkaline Phosphatase: 86 U/L — ABNORMAL HIGH (ref 26–84)
BUN, Bld: 8 mg/dL (ref 7–22)
CALCIUM: 8.8 mg/dL (ref 8.0–10.3)
CHLORIDE: 99 meq/L (ref 98–108)
CO2: 29 mEq/L (ref 18–33)
Creat: 0.5 mg/dl — ABNORMAL LOW (ref 0.6–1.2)
Glucose, Bld: 92 mg/dL (ref 73–118)
Potassium: 2.7 mEq/L — CL (ref 3.3–4.7)
Sodium: 140 mEq/L (ref 128–145)
TOTAL PROTEIN: 7.7 g/dL (ref 6.4–8.1)
Total Bilirubin: 0.4 mg/dl (ref 0.20–1.60)

## 2014-08-27 LAB — CBC WITH DIFFERENTIAL (CANCER CENTER ONLY)
BASO#: 0 10*3/uL (ref 0.0–0.2)
BASO%: 0.4 % (ref 0.0–2.0)
EOS%: 1 % (ref 0.0–7.0)
Eosinophils Absolute: 0.1 10*3/uL (ref 0.0–0.5)
HCT: 33.4 % — ABNORMAL LOW (ref 34.8–46.6)
HEMOGLOBIN: 10.1 g/dL — AB (ref 11.6–15.9)
LYMPH#: 1 10*3/uL (ref 0.9–3.3)
LYMPH%: 10.1 % — AB (ref 14.0–48.0)
MCH: 29.9 pg (ref 26.0–34.0)
MCHC: 30.2 g/dL — ABNORMAL LOW (ref 32.0–36.0)
MCV: 99 fL (ref 81–101)
MONO#: 1.3 10*3/uL — AB (ref 0.1–0.9)
MONO%: 13.2 % — ABNORMAL HIGH (ref 0.0–13.0)
NEUT%: 75.3 % (ref 39.6–80.0)
NEUTROS ABS: 7.3 10*3/uL — AB (ref 1.5–6.5)
PLATELETS: 513 10*3/uL — AB (ref 145–400)
RBC: 3.38 10*6/uL — AB (ref 3.70–5.32)
RDW: 14.6 % (ref 11.1–15.7)
WBC: 9.7 10*3/uL (ref 3.9–10.0)

## 2014-08-27 MED ORDER — SODIUM CHLORIDE 0.9 % IV SOLN
4.0000 mg | Freq: Once | INTRAVENOUS | Status: AC
  Administered 2014-08-27: 4 mg via INTRAVENOUS
  Filled 2014-08-27: qty 5

## 2014-08-27 MED ORDER — ONDANSETRON HCL 8 MG PO TABS
ORAL_TABLET | ORAL | Status: AC
  Filled 2014-08-27: qty 1

## 2014-08-27 MED ORDER — POTASSIUM CHLORIDE CRYS ER 20 MEQ PO TBCR: 40.0000 meq | EXTENDED_RELEASE_TABLET | Freq: Two times a day (BID) | ORAL | Status: DC

## 2014-08-27 MED ORDER — ONDANSETRON HCL 8 MG PO TABS
8.0000 mg | ORAL_TABLET | Freq: Once | ORAL | Status: AC
  Administered 2014-08-27: 8 mg via ORAL

## 2014-08-27 MED ORDER — BORTEZOMIB CHEMO SQ INJECTION 3.5 MG (2.5MG/ML)
1.3000 mg/m2 | Freq: Once | INTRAMUSCULAR | Status: AC
Start: 2014-08-27 — End: 2014-08-27
  Administered 2014-08-27: 2.25 mg via SUBCUTANEOUS
  Filled 2014-08-27: qty 2.25

## 2014-08-27 NOTE — Progress Notes (Signed)
[facility]  [phone] [phone]) [phone]  ID: Samantha Moyer OB: 1965-10-31 MR#: [id number] EXH#:[id number] Patient Care Team: Darlyne Russian, MD as PCP - General (Family Medicine)  DIAGNOSIS: IgA lambda myeloma  INTERVAL HISTORY: Samantha Moyer is here today for follow-up and Cycle 3 of treatment. She is feeling much better. She has had no more problems since we saw her last week.  She denies fever, chills, n/v, cough, rash, headache, dizziness, SOB, chest pain, palpitations, abdominal pain, constipation, diarrhea, blood in urine or stool. Her reflux is much better on the Protonix.  No swelling, tenderness, numbness or tingling in her extremities.  Her appetite is good and she is drinking plenty of fluids. Her weight is stable.  She is very concerned about returning to work and being exposing to infections. She would like to work but wants to know if it is safe. I spoke with Dr. Marin Olp about this and he feels that she would be fine to return to work. We will add IV IG to her treatment schedule every 2 months to help with infection prevention.   CURRENT TREATMENT: Radiation therapy for pathologic fracture of the left humerus Velcade/Revlimid/Decadron Zometa 4 mg IV every month IVIG every 2 months    REVIEW OF SYSTEMS: All other 10 point review of systems is negative.   PAST MEDICAL HISTORY: Past Medical History  Diagnosis Date  . Scleroderma     Tobie Lords  . IgA myeloma 07/21/2014  . Raynaud disease     PAST SURGICAL HISTORY: Past Surgical History  Procedure Laterality Date  . Nasal septum surgery    . Uterine fibroid embolization      FAMILY HISTORY Family History  Problem Relation Age of Onset  . Heart disease Mother   . Diabetes Mother   . Hypertension Mother   . Hyperlipidemia Mother   . Cancer Brother     GYNECOLOGIC HISTORY:  No LMP recorded. Patient is postmenopausal.   SOCIAL HISTORY: History   Social History  . Marital  Status: Divorced    Spouse Name: N/A    Number of Children: 0  . Years of Education: N/A   Occupational History  . education    Social History Main Topics  . Smoking status: Never Smoker   . Smokeless tobacco: Never Used     Comment: NEVER USED TOBACCO  . Alcohol Use: No  . Drug Use: No  . Sexual Activity: Not on file   Other Topics Concern  . Not on file   Social History Narrative    ADVANCED DIRECTIVES:  <no information>  HEALTH MAINTENANCE: History  Substance Use Topics  . Smoking status: Never Smoker   . Smokeless tobacco: Never Used     Comment: NEVER USED TOBACCO  . Alcohol Use: No   Colonoscopy: PAP: Bone density: Lipid panel:  Allergies  Allergen Reactions  . Codeine Nausea Only  . Tramadol Nausea And Vomiting and Other (See Comments)    Current Outpatient Prescriptions  Medication Sig Dispense Refill  . aspirin 325 MG EC tablet Take 325 mg by mouth daily.    Marland Kitchen dexamethasone (DECADRON) 4 MG tablet Take 5 pills once a week with food (Patient taking differently: Take 20 mg by mouth once a week. Take 5 pills once a week with food) 80 tablet 3  . famciclovir (FAMVIR) 500 MG tablet Take 1 pill a day (Patient taking differently: Take 500 mg by mouth daily. ) 30 tablet 6  . feeding supplement, RESOURCE BREEZE, (  RESOURCE BREEZE) LIQD Take 1 Container by mouth 2 (two) times daily between meals. [place] HYDROmorphone (DILAUDID) 2 MG tablet Take 1 tablet (2 mg total) by mouth every 4 (four) hours as needed for severe pain. 180 tablet 0  . lenalidomide (REVLIMID) 25 MG capsule Take 1 pill at nighttime for 21 days on and 7 days off. Auth # [id number] 21 capsule 0  . Multiple Vitamins-Minerals (ALIVE WOMENS ENERGY) TABS Take 1 tablet by mouth daily.    . ondansetron (ZOFRAN) 8 MG tablet Take 1 tablet (8 mg total) by mouth 2 (two) times daily. Start the day after chemo for 2 days. Then take as needed for nausea or vomiting. 30 tablet 1  . pantoprazole (PROTONIX)  40 MG tablet Take 1 tablet (40 mg total) by mouth daily. 30 tablet 5  . hyaluronate sodium (RADIAPLEXRX) GEL Apply 1 application topically once.    Marland Kitchen LORazepam (ATIVAN) 0.5 MG tablet Take 1 tablet (0.5 mg total) by mouth every 6 (six) hours as needed (Nausea or vomiting). (Patient not taking: Reported on 08/12/2014) 30 tablet 0  . potassium chloride SA (K-DUR,KLOR-CON) 20 MEQ tablet Take 2 tablets (40 mEq total) by mouth 2 (two) times daily. 100 tablet 2  . prochlorperazine (COMPAZINE) 10 MG tablet Take 1 tablet (10 mg total) by mouth every 6 (six) hours as needed (Nausea or vomiting). (Patient not taking: Reported on 08/12/2014) 30 tablet 1   No current facility-administered medications for this visit.   Facility-Administered Medications Ordered in Other Visits  Medication Dose Route Frequency Provider Last Rate Last Dose  . bortezomib SQ (VELCADE) chemo injection 2.25 mg  1.3 mg/m2 (Treatment Plan Actual) Subcutaneous Once Volanda Napoleon, MD      . ondansetron [facility]) tablet 8 mg  8 mg Oral Once Volanda Napoleon, MD      . zolendronic acid (ZOMETA) 4 mg in sodium chloride 0.9 % 100 mL IVPB  4 mg Intravenous Once Volanda Napoleon, MD        OBJECTIVE: Filed Vitals:   08/27/14 1207  BP: 106/73  Pulse: 87  Temp: 97.7 F (36.5 C)  Resp: 14    Filed Weights   08/27/14 1207  Weight: 140 lb (63.504 kg)   ECOG FS:0 - Asymptomatic Ocular: Sclerae unicteric, pupils equal, round and reactive to light Ear-nose-throat: Oropharynx clear, dentition fair Lymphatic: No cervical or supraclavicular adenopathy Lungs no rales or rhonchi, good excursion bilaterally Heart regular rate and rhythm, no murmur appreciated Abd soft, nontender, positive bowel sounds MSK no focal spinal tenderness, no joint edema Neuro: non-focal, well-oriented, appropriate affect Breasts: Deferred  LAB RESULTS: CMP     Component Value Date/Time   NA 140 08/27/2014 1110   NA 134* 08/06/2014 0429   K 2.7* 08/27/2014  1110   K 3.4* 08/06/2014 0429   CL 99 08/27/2014 1110   CL 96 08/06/2014 0429   CO2 29 08/27/2014 1110   CO2 23 08/06/2014 0429   GLUCOSE 92 08/27/2014 1110   GLUCOSE 94 08/06/2014 0429   BUN 8 08/27/2014 1110   BUN 5* 08/06/2014 0429   CREATININE 0.5* 08/27/2014 1110   CREATININE 0.79 08/06/2014 0429   CALCIUM 8.8 08/27/2014 1110   CALCIUM 7.6* 08/06/2014 0429   PROT 7.7 08/27/2014 1110   PROT 7.9 08/05/2014 0455   ALBUMIN 2.1* 08/05/2014 0455   AST 17 08/27/2014 1110   AST 54* 08/05/2014 0455   ALT 13 08/27/2014 1110   ALT 25 08/05/2014  0455   ALKPHOS 86* 08/27/2014 1110   ALKPHOS 64 08/05/2014 0455   BILITOT 0.40 08/27/2014 1110   BILITOT 0.4 08/05/2014 0455   GFRNONAA >90 08/06/2014 0429   GFRNONAA 86 06/27/2014 2025   GFRAA >90 08/06/2014 0429   GFRAA >89 06/27/2014 2025   INo results found for: SPEP, UPEP Lab Results  Component Value Date   WBC 9.7 08/27/2014   NEUTROABS 7.3* 08/27/2014   HGB 10.1* 08/27/2014   HCT 33.4* 08/27/2014   MCV 99 08/27/2014   PLT 513* 08/27/2014   No results found for: LABCA2 No components found for: [id number] No results for input(s): INR in the last 168 hours.  STUDIES: None  ASSESSMENT/PLAN: Samantha Moyer is a very pleasant 49 yo [place] American female with fairly aggressive IgA lambda myeloma. She is feeling much better and has had no more fever or pain since we saw her last week.  Today her WBC 9.7 Hgb 10.1 MCV 99 platelets 513.  We will proceed with Cycle 3 of treatment today.  We will be adding IVIG to her treatment plan every 2 months. She will eventually need a stem cell transplant. We have a ways to go before we can get her to that point. We will give her a new treatment plan today.  She knows to call here with any questions or concerns and to go to the ED in the event of an emergency. We can certainly see her sooner if need be.   Eliezer Bottom, NP 08/27/2014 1:15 PM

## 2014-08-27 NOTE — Patient Instructions (Signed)
[facility] Discharge Instructions for Patients Receiving Chemotherapy  Today you received the following chemotherapy agents:  Velcade  To help prevent nausea and vomiting after your treatment, we encourage you to take your nausea medication.   If you develop nausea and vomiting that is not controlled by your nausea medication, call the clinic.   BELOW ARE SYMPTOMS THAT SHOULD BE REPORTED IMMEDIATELY:  *FEVER GREATER THAN 100.5 F  *CHILLS WITH OR WITHOUT FEVER  NAUSEA AND VOMITING THAT IS NOT CONTROLLED WITH YOUR NAUSEA MEDICATION  *UNUSUAL SHORTNESS OF BREATH  *UNUSUAL BRUISING OR BLEEDING  TENDERNESS IN MOUTH AND THROAT WITH OR WITHOUT PRESENCE OF ULCERS  *URINARY PROBLEMS  *BOWEL PROBLEMS  UNUSUAL RASH Items with * indicate a potential emergency and should be followed up as soon as possible.  Feel free to call the clinic you have any questions or concerns. The clinic phone number is [phone].   Zoledronic Acid injection (Hypercalcemia, Oncology) What is this medicine? ZOLEDRONIC ACID (ZOE le dron ik AS id) lowers the amount of calcium loss from bone. It is used to treat too much calcium in your blood from cancer. It is also used to prevent complications of cancer that has spread to the bone. This medicine may be used for other purposes; ask your health care provider or pharmacist if you have questions. COMMON BRAND NAME(S): Zometa What should I tell my health care provider before I take this medicine? They need to know if you have any of these conditions: -aspirin-sensitive asthma -cancer, especially if you are receiving medicines used to treat cancer -dental disease or wear dentures -infection -kidney disease -receiving corticosteroids like dexamethasone or prednisone -an unusual or allergic reaction to zoledronic acid, other medicines, foods, dyes, or preservatives -pregnant or trying to get pregnant -breast-feeding How should I use this  medicine? This medicine is for infusion into a vein. It is given by a health care professional in a hospital or clinic setting. Talk to your pediatrician regarding the use of this medicine in children. Special care may be needed. Overdosage: If you think you have taken too much of this medicine contact a poison control center or emergency room at once. NOTE: This medicine is only for you. Do not share this medicine with others. What if I miss a dose? It is important not to miss your dose. Call your doctor or health care professional if you are unable to keep an appointment. What may interact with this medicine? -certain antibiotics given by injection -NSAIDs, medicines for pain and inflammation, like ibuprofen or naproxen -some diuretics like bumetanide, furosemide -teriparatide -thalidomide This list may not describe all possible interactions. Give your health care provider a list of all the medicines, herbs, non-prescription drugs, or dietary supplements you use. Also tell them if you smoke, drink alcohol, or use illegal drugs. Some items may interact with your medicine. What should I watch for while using this medicine? Visit your doctor or health care professional for regular checkups. It may be some time before you see the benefit from this medicine. Do not stop taking your medicine unless your doctor tells you to. Your doctor may order blood tests or other tests to see how you are doing. Women should inform their doctor if they wish to become pregnant or think they might be pregnant. There is a potential for serious side effects to an unborn child. Talk to your health care professional or pharmacist for more information. You should make sure that you get enough  calcium and vitamin D while you are taking this medicine. Discuss the foods you eat and the vitamins you take with your health care professional. Some people who take this medicine have severe bone, joint, and/or muscle pain. This  medicine may also increase your risk for jaw problems or a broken thigh bone. Tell your doctor right away if you have severe pain in your jaw, bones, joints, or muscles. Tell your doctor if you have any pain that does not go away or that gets worse. Tell your dentist and dental surgeon that you are taking this medicine. You should not have major dental surgery while on this medicine. See your dentist to have a dental exam and fix any dental problems before starting this medicine. Take good care of your teeth while on this medicine. Make sure you see your dentist for regular follow-up appointments. What side effects may I notice from receiving this medicine? Side effects that you should report to your doctor or health care professional as soon as possible: -allergic reactions like skin rash, itching or hives, swelling of the face, lips, or tongue -anxiety, confusion, or depression -breathing problems -changes in vision -eye pain -feeling faint or lightheaded, falls -jaw pain, especially after dental work -mouth sores -muscle cramps, stiffness, or weakness -trouble passing urine or change in the amount of urine Side effects that usually do not require medical attention (report to your doctor or health care professional if they continue or are bothersome): -bone, joint, or muscle pain -constipation -diarrhea -fever -hair loss -irritation at site where injected -loss of appetite -nausea, vomiting -stomach upset -trouble sleeping -trouble swallowing -weak or tired This list may not describe all possible side effects. Call your doctor for medical advice about side effects. You may report side effects to FDA at 1-800-FDA-1088. Where should I keep my medicine? This drug is given in a hospital or clinic and will not be stored at home. NOTE: This sheet is a summary. It may not cover all possible information. If you have questions about this medicine, talk to your doctor, pharmacist, or health  care provider.  2015, Elsevier/Gold Standard. (2013-01-17 13:03:13) Bortezomib injection What is this medicine? BORTEZOMIB (bor TEZ oh mib) is a chemotherapy drug. It slows the growth of cancer cells. This medicine is used to treat multiple myeloma, and certain lymphomas, such as mantle-cell lymphoma. This medicine may be used for other purposes; ask your health care provider or pharmacist if you have questions. COMMON BRAND NAME(S): Velcade What should I tell my health care provider before I take this medicine? They need to know if you have any of these conditions: -diabetes -heart disease -irregular heartbeat -liver disease -on hemodialysis -low blood counts, like low white blood cells, platelets, or hemoglobin -peripheral neuropathy -taking medicine for blood pressure -an unusual or allergic reaction to bortezomib, mannitol, boron, other medicines, foods, dyes, or preservatives -pregnant or trying to get pregnant -breast-feeding How should I use this medicine? This medicine is for injection into a vein or for injection under the skin. It is given by a health care professional in a hospital or clinic setting. Talk to your pediatrician regarding the use of this medicine in children. Special care may be needed. Overdosage: If you think you have taken too much of this medicine contact a poison control center or emergency room at once. NOTE: This medicine is only for you. Do not share this medicine with others. What if I miss a dose? It is important not to miss your dose.  Call your doctor or health care professional if you are unable to keep an appointment. What may interact with this medicine? This medicine may interact with the following medications: -ketoconazole -rifampin -ritonavir -St. John's Wort This list may not describe all possible interactions. Give your health care provider a list of all the medicines, herbs, non-prescription drugs, or dietary supplements you use. Also  tell them if you smoke, drink alcohol, or use illegal drugs. Some items may interact with your medicine. What should I watch for while using this medicine? Visit your doctor for checks on your progress. This drug may make you feel generally unwell. This is not uncommon, as chemotherapy can affect healthy cells as well as cancer cells. Report any side effects. Continue your course of treatment even though you feel ill unless your doctor tells you to stop. You may get drowsy or dizzy. Do not drive, use machinery, or do anything that needs mental alertness until you know how this medicine affects you. Do not stand or sit up quickly, especially if you are an older patient. This reduces the risk of dizzy or fainting spells. In some cases, you may be given additional medicines to help with side effects. Follow all directions for their use. Call your doctor or health care professional for advice if you get a fever, chills or sore throat, or other symptoms of a cold or flu. Do not treat yourself. This drug decreases your body's ability to fight infections. Try to avoid being around people who are sick. This medicine may increase your risk to bruise or bleed. Call your doctor or health care professional if you notice any unusual bleeding. You may need blood work done while you are taking this medicine. In some patients, this medicine may cause a serious brain infection that may cause death. If you have any problems seeing, thinking, speaking, walking, or standing, tell your doctor right away. If you cannot reach your doctor, urgently seek other source of medical care. Do not become pregnant while taking this medicine. Women should inform their doctor if they wish to become pregnant or think they might be pregnant. There is a potential for serious side effects to an unborn child. Talk to your health care professional or pharmacist for more information. Do not breast-feed an infant while taking this medicine. Check  with your doctor or health care professional if you get an attack of severe diarrhea, nausea and vomiting, or if you sweat a lot. The loss of too much body fluid can make it dangerous for you to take this medicine. What side effects may I notice from receiving this medicine? Side effects that you should report to your doctor or health care professional as soon as possible: -allergic reactions like skin rash, itching or hives, swelling of the face, lips, or tongue -breathing problems -changes in hearing -changes in vision -fast, irregular heartbeat -feeling faint or lightheaded, falls -pain, tingling, numbness in the hands or feet -right upper belly pain -seizures -swelling of the ankles, feet, hands -unusual bleeding or bruising -unusually weak or tired -vomiting -yellowing of the eyes or skin Side effects that usually do not require medical attention (report to your doctor or health care professional if they continue or are bothersome): -changes in emotions or moods -constipation -diarrhea -loss of appetite -headache -irritation at site where injected -nausea This list may not describe all possible side effects. Call your doctor for medical advice about side effects. You may report side effects to FDA at 1-800-FDA-1088. Where should  I keep my medicine? This drug is given in a hospital or clinic and will not be stored at home. NOTE: This sheet is a summary. It may not cover all possible information. If you have questions about this medicine, talk to your doctor, pharmacist, or health care provider.  2015, Elsevier/Gold Standard. (2013-06-03 12:46:32)

## 2014-08-27 NOTE — Progress Notes (Signed)
Potassium 2.7.  Dr. Marin Olp aware.  Called in Potassium 20 meq 2 tablets bid until we see her again.Reviewed this information with patient

## 2014-08-29 LAB — PROTEIN ELECTROPHORESIS, SERUM, WITH REFLEX
Albumin ELP: 43.3 % — ABNORMAL LOW (ref 55.8–66.1)
Alpha-1-Globulin: 5.2 % — ABNORMAL HIGH (ref 2.9–4.9)
Alpha-2-Globulin: 12.7 % — ABNORMAL HIGH (ref 7.1–11.8)
BETA GLOBULIN: 21.9 % — AB (ref 4.7–7.2)
Beta 2: 4.8 % (ref 3.2–6.5)
GAMMA GLOBULIN: 12.1 % (ref 11.1–18.8)
M-Spike, %: 0.13 g/dL
TOTAL PROTEIN, SERUM ELECTROPHOR: 6.9 g/dL (ref 6.0–8.3)

## 2014-08-29 LAB — IGG, IGA, IGM
IGG (IMMUNOGLOBIN G), SERUM: 926 mg/dL (ref 690–1700)
IgA: 1460 mg/dL — ABNORMAL HIGH (ref 69–380)
IgM, Serum: 36 mg/dL — ABNORMAL LOW (ref 52–322)

## 2014-08-29 LAB — BETA 2 MICROGLOBULIN, SERUM: Beta-2 Microglobulin: 2.36 mg/L (ref <=2.51)

## 2014-08-29 LAB — RETICULOCYTES (CHCC)
ABS Retic: 48.3 10*3/uL (ref 19.0–186.0)
RBC.: 3.45 MIL/uL — ABNORMAL LOW (ref 3.87–5.11)
Retic Ct Pct: 1.4 % (ref 0.4–2.3)

## 2014-08-29 LAB — LACTATE DEHYDROGENASE: LDH: 131 U/L (ref 94–250)

## 2014-08-29 LAB — KAPPA/LAMBDA LIGHT CHAINS
KAPPA LAMBDA RATIO: 0.03 — AB (ref 0.26–1.65)
Kappa free light chain: 1.16 mg/dL (ref 0.33–1.94)
Lambda Free Lght Chn: 37.7 mg/dL — ABNORMAL HIGH (ref 0.57–2.63)

## 2014-08-29 LAB — ERYTHROPOIETIN: Erythropoietin: 21.7 m[IU]/mL — ABNORMAL HIGH (ref 2.6–18.5)

## 2014-08-29 LAB — IFE INTERPRETATION

## 2014-08-30 ENCOUNTER — Encounter: Payer: Self-pay | Admitting: Radiation Oncology

## 2014-08-30 NOTE — Progress Notes (Signed)
  Radiation Oncology         [phone]) [phone] ________________________________  Name: Samantha Moyer MRN: [id number]  Date: 08/30/2014  DOB: 1965-09-23  End of Treatment Note  Diagnosis:     Multiple myeloma involving the left humerus   Indication for treatment:  Pain in the left shoulder and upper arm area, pathologic fracture       Radiation treatment dates:   December 10 through December 23  Site/dose:   Left humerus/shoulder - 30 Gy  Beams/energy:   AP/ PA  6 and 10 MV beams  Narrative: The patient tolerated radiation treatment relatively well.   She had improvement in her pain and left shoulder mobility at the completion of treatment.  Plan: The patient has completed radiation treatment. The patient will return to radiation oncology clinic for routine followup in one month. I advised them to call or return sooner if they have any questions or concerns related to their recovery or treatment.  -----------------------------------  Blair Promise, PhD, MD

## 2014-09-02 ENCOUNTER — Telehealth: Payer: Self-pay | Admitting: Hematology & Oncology

## 2014-09-02 NOTE — Telephone Encounter (Signed)
[id number]  Dos: 09/03/2014  INJECTION, IMMUNE GLOBULIN, (OCTAGAM), INTRAVENOUS, NONLYOPHILIZED (E.G.,    DENIED: [id number]

## 2014-09-03 ENCOUNTER — Ambulatory Visit (HOSPITAL_BASED_OUTPATIENT_CLINIC_OR_DEPARTMENT_OTHER): Payer: BC Managed Care – PPO

## 2014-09-03 ENCOUNTER — Other Ambulatory Visit (HOSPITAL_BASED_OUTPATIENT_CLINIC_OR_DEPARTMENT_OTHER): Payer: BC Managed Care – PPO | Admitting: Lab

## 2014-09-03 DIAGNOSIS — C9000 Multiple myeloma not having achieved remission: Secondary | ICD-10-CM

## 2014-09-03 DIAGNOSIS — Z5112 Encounter for antineoplastic immunotherapy: Secondary | ICD-10-CM

## 2014-09-03 LAB — CMP (CANCER CENTER ONLY)
ALT: 15 U/L (ref 10–47)
AST: 21 U/L (ref 11–38)
Albumin: 2.9 g/dL — ABNORMAL LOW (ref 3.3–5.5)
Alkaline Phosphatase: 86 U/L — ABNORMAL HIGH (ref 26–84)
BUN, Bld: 6 mg/dL — ABNORMAL LOW (ref 7–22)
CALCIUM: 8.8 mg/dL (ref 8.0–10.3)
CHLORIDE: 99 meq/L (ref 98–108)
CO2: 29 meq/L (ref 18–33)
Creat: 0.7 mg/dl (ref 0.6–1.2)
Glucose, Bld: 93 mg/dL (ref 73–118)
POTASSIUM: 3.9 meq/L (ref 3.3–4.7)
SODIUM: 139 meq/L (ref 128–145)
TOTAL PROTEIN: 7.3 g/dL (ref 6.4–8.1)
Total Bilirubin: 0.5 mg/dl (ref 0.20–1.60)

## 2014-09-03 LAB — CBC WITH DIFFERENTIAL (CANCER CENTER ONLY)
BASO#: 0 10*3/uL (ref 0.0–0.2)
BASO%: 0.2 % (ref 0.0–2.0)
EOS%: 4.5 % (ref 0.0–7.0)
Eosinophils Absolute: 0.4 10*3/uL (ref 0.0–0.5)
HEMATOCRIT: 35.4 % (ref 34.8–46.6)
HGB: 10.7 g/dL — ABNORMAL LOW (ref 11.6–15.9)
LYMPH#: 1 10*3/uL (ref 0.9–3.3)
LYMPH%: 11.3 % — ABNORMAL LOW (ref 14.0–48.0)
MCH: 29.3 pg (ref 26.0–34.0)
MCHC: 30.2 g/dL — ABNORMAL LOW (ref 32.0–36.0)
MCV: 97 fL (ref 81–101)
MONO#: 0.5 10*3/uL (ref 0.1–0.9)
MONO%: 5.9 % (ref 0.0–13.0)
NEUT#: 6.9 10*3/uL — ABNORMAL HIGH (ref 1.5–6.5)
NEUT%: 78.1 % (ref 39.6–80.0)
Platelets: 373 10*3/uL (ref 145–400)
RBC: 3.65 10*6/uL — ABNORMAL LOW (ref 3.70–5.32)
RDW: 14.5 % (ref 11.1–15.7)
WBC: 8.9 10*3/uL (ref 3.9–10.0)

## 2014-09-03 MED ORDER — ONDANSETRON HCL 8 MG PO TABS
8.0000 mg | ORAL_TABLET | Freq: Once | ORAL | Status: AC
  Administered 2014-09-03: 8 mg via ORAL

## 2014-09-03 MED ORDER — IMMUNE GLOBULIN (HUMAN) 20 GM/200ML IV SOLN
40.0000 g | Freq: Once | INTRAVENOUS | Status: AC
  Administered 2014-09-03: 40 g via INTRAVENOUS
  Filled 2014-09-03: qty 400

## 2014-09-03 MED ORDER — SODIUM CHLORIDE 0.9 % IV SOLN
Freq: Once | INTRAVENOUS | Status: AC
  Administered 2014-09-03: 13:00:00 via INTRAVENOUS

## 2014-09-03 MED ORDER — ACETAMINOPHEN 325 MG PO TABS
650.0000 mg | ORAL_TABLET | Freq: Once | ORAL | Status: AC
  Administered 2014-09-03: 650 mg via ORAL

## 2014-09-03 MED ORDER — BORTEZOMIB CHEMO SQ INJECTION 3.5 MG (2.5MG/ML)
1.3000 mg/m2 | Freq: Once | INTRAMUSCULAR | Status: AC
  Administered 2014-09-03: 2.25 mg via SUBCUTANEOUS
  Filled 2014-09-03: qty 2.25

## 2014-09-03 MED ORDER — DIPHENHYDRAMINE HCL 25 MG PO CAPS
25.0000 mg | ORAL_CAPSULE | Freq: Once | ORAL | Status: AC
  Administered 2014-09-03: 25 mg via ORAL

## 2014-09-03 MED ORDER — ACETAMINOPHEN 325 MG PO TABS
ORAL_TABLET | ORAL | Status: AC
  Filled 2014-09-03: qty 2

## 2014-09-03 MED ORDER — DIPHENHYDRAMINE HCL 25 MG PO CAPS
ORAL_CAPSULE | ORAL | Status: AC
  Filled 2014-09-03: qty 1

## 2014-09-03 MED ORDER — ONDANSETRON HCL 8 MG PO TABS
ORAL_TABLET | ORAL | Status: AC
  Filled 2014-09-03: qty 1

## 2014-09-03 NOTE — Patient Instructions (Signed)
Bortezomib injection What is this medicine? BORTEZOMIB (bor TEZ oh mib) is a chemotherapy drug. It slows the growth of cancer cells. This medicine is used to treat multiple myeloma, and certain lymphomas, such as mantle-cell lymphoma. This medicine may be used for other purposes; ask your health care provider or pharmacist if you have questions. COMMON BRAND NAME(S): Velcade What should I tell my health care provider before I take this medicine? They need to know if you have any of these conditions: -diabetes -heart disease -irregular heartbeat -liver disease -on hemodialysis -low blood counts, like low white blood cells, platelets, or hemoglobin -peripheral neuropathy -taking medicine for blood pressure -an unusual or allergic reaction to bortezomib, mannitol, boron, other medicines, foods, dyes, or preservatives -pregnant or trying to get pregnant -breast-feeding How should I use this medicine? This medicine is for injection into a vein or for injection under the skin. It is given by a health care professional in a hospital or clinic setting. Talk to your pediatrician regarding the use of this medicine in children. Special care may be needed. Overdosage: If you think you have taken too much of this medicine contact a poison control center or emergency room at once. NOTE: This medicine is only for you. Do not share this medicine with others. What if I miss a dose? It is important not to miss your dose. Call your doctor or health care professional if you are unable to keep an appointment. What may interact with this medicine? This medicine may interact with the following medications: -ketoconazole -rifampin -ritonavir -St. John's Wort This list may not describe all possible interactions. Give your health care provider a list of all the medicines, herbs, non-prescription drugs, or dietary supplements you use. Also tell them if you smoke, drink alcohol, or use illegal drugs. Some items  may interact with your medicine. What should I watch for while using this medicine? Visit your doctor for checks on your progress. This drug may make you feel generally unwell. This is not uncommon, as chemotherapy can affect healthy cells as well as cancer cells. Report any side effects. Continue your course of treatment even though you feel ill unless your doctor tells you to stop. You may get drowsy or dizzy. Do not drive, use machinery, or do anything that needs mental alertness until you know how this medicine affects you. Do not stand or sit up quickly, especially if you are an older patient. This reduces the risk of dizzy or fainting spells. In some cases, you may be given additional medicines to help with side effects. Follow all directions for their use. Call your doctor or health care professional for advice if you get a fever, chills or sore throat, or other symptoms of a cold or flu. Do not treat yourself. This drug decreases your body's ability to fight infections. Try to avoid being around people who are sick. This medicine may increase your risk to bruise or bleed. Call your doctor or health care professional if you notice any unusual bleeding. You may need blood work done while you are taking this medicine. In some patients, this medicine may cause a serious brain infection that may cause death. If you have any problems seeing, thinking, speaking, walking, or standing, tell your doctor right away. If you cannot reach your doctor, urgently seek other source of medical care. Do not become pregnant while taking this medicine. Women should inform their doctor if they wish to become pregnant or think they might be pregnant. There is   a potential for serious side effects to an unborn child. Talk to your health care professional or pharmacist for more information. Do not breast-feed an infant while taking this medicine. Check with your doctor or health care professional if you get an attack of  severe diarrhea, nausea and vomiting, or if you sweat a lot. The loss of too much body fluid can make it dangerous for you to take this medicine. What side effects may I notice from receiving this medicine? Side effects that you should report to your doctor or health care professional as soon as possible: -allergic reactions like skin rash, itching or hives, swelling of the face, lips, or tongue -breathing problems -changes in hearing -changes in vision -fast, irregular heartbeat -feeling faint or lightheaded, falls -pain, tingling, numbness in the hands or feet -right upper belly pain -seizures -swelling of the ankles, feet, hands -unusual bleeding or bruising -unusually weak or tired -vomiting -yellowing of the eyes or skin Side effects that usually do not require medical attention (report to your doctor or health care professional if they continue or are bothersome): -changes in emotions or moods -constipation -diarrhea -loss of appetite -headache -irritation at site where injected -nausea This list may not describe all possible side effects. Call your doctor for medical advice about side effects. You may report side effects to FDA at 1-800-FDA-1088. Where should I keep my medicine? This drug is given in a hospital or clinic and will not be stored at home. NOTE: This sheet is a summary. It may not cover all possible information. If you have questions about this medicine, talk to your doctor, pharmacist, or health care provider.  2015, Elsevier/Gold Standard. (2013-06-03 12:46:32) Immune Globulin Injection What is this medicine? IMMUNE GLOBULIN (im MUNE GLOB yoo lin) helps to prevent or reduce the severity of certain infections in patients who are at risk. This medicine is collected from the pooled blood of many donors. It is used to treat immune system problems, thrombocytopenia, and Kawasaki syndrome. This medicine may be used for other purposes; ask your health care provider or  pharmacist if you have questions. COMMON BRAND NAME(S): Baygam, BIVIGAM, Carimune, Carimune NF, Flebogamma, Flebogamma DIF, GamaSTAN S/D, Gamimune N, Gammagard S/D, Gammaked, Gammaplex, Gammar-P IV, Gamunex, Gamunex-C, Hizentra, Iveegam, Iveegam EN, Octagam, Panglobulin, Panglobulin NF, Polygam S/D, Privigen, Sandoglobulin, Venoglobulin-S, Vigam, Vivaglobulin What should I tell my health care provider before I take this medicine? They need to know if you have any of these conditions: - diabetes - extremely low or no immune antibodies in the blood - heart disease - history of blood clots - hyperprolinemia - infection in the blood, sepsis - kidney disease - taking medicine that may change kidney function - ask your health care provider about your medicine - an unusual or allergic reaction to human immune globulin, albumin, maltose, sucrose, polysorbate 80, other medicines, foods, dyes, or preservatives - pregnant or trying to get pregnant - breast-feeding How should I use this medicine? This medicine is for injection into a muscle or infusion into a vein or skin. It is usually given by a health care professional in a hospital or clinic setting. In rare cases, some brands of this medicine might be given at home. You will be taught how to give this medicine. Use exactly as directed. Take your medicine at regular intervals. Do not take your medicine more often than directed. Talk to your pediatrician regarding the use of this medicine in children. Special care may be needed. Overdosage: If you think  you have taken too much of this medicine contact a poison control center or emergency room at once. NOTE: This medicine is only for you. Do not share this medicine with others. What if I miss a dose? It is important not to miss your dose. Call your doctor or health care professional if you are unable to keep an appointment. If you give yourself the medicine and you miss a dose, take it as soon  as you can. If it is almost time for your next dose, take only that dose. Do not take double or extra doses. What may interact with this medicine? -aspirin and aspirin-like medicines -cisplatin -cyclosporine -medicines for infection like acyclovir, adefovir, amphotericin B, bacitracin, cidofovir, foscarnet, ganciclovir, gentamicin, pentamidine, vancomycin -NSAIDS, medicines for pain and inflammation, like ibuprofen or naproxen -pamidronate -vaccines -zoledronic acid This list may not describe all possible interactions. Give your health care provider a list of all the medicines, herbs, non-prescription drugs, or dietary supplements you use. Also tell them if you smoke, drink alcohol, or use illegal drugs. Some items may interact with your medicine. What should I watch for while using this medicine? Your condition will be monitored carefully while you are receiving this medicine. This medicine is made from pooled blood donations of many different people. It may be possible to pass an infection in this medicine. However, the donors are screened for infections and all products are tested for HIV and hepatitis. The medicine is treated to kill most or all bacteria and viruses. Talk to your doctor about the risks and benefits of this medicine. Do not have vaccinations for at least 14 days before, or until at least 3 months after receiving this medicine. What side effects may I notice from receiving this medicine? Side effects that you should report to your doctor or health care professional as soon as possible: -allergic reactions like skin rash, itching or hives, swelling of the face, lips, or tongue -breathing problems -chest pain or tightness -fever, chills -headache with nausea, vomiting -neck pain or difficulty moving neck -pain when moving eyes -pain, swelling, warmth in the leg -problems with balance, talking, walking -sudden weight gain -swelling of the ankles, feet, hands -trouble  passing urine or change in the amount of urine Side effects that usually do not require medical attention (report to your doctor or health care professional if they continue or are bothersome): -dizzy, drowsy -flushing -increased sweating -leg cramps -muscle aches and pains -pain at site where injected This list may not describe all possible side effects. Call your doctor for medical advice about side effects. You may report side effects to FDA at 1-800-FDA-1088. Where should I keep my medicine? Keep out of the reach of children. This drug is usually given in a hospital or clinic and will not be stored at home. In rare cases, some brands of this medicine may be given at home. If you are using this medicine at home, you will be instructed on how to store this medicine. Throw away any unused medicine after the expiration date on the label. NOTE: This sheet is a summary. It may not cover all possible information. If you have questions about this medicine, talk to your doctor, pharmacist, or health care provider.  2015, Elsevier/Gold Standard. (2008-10-29 11:44:49)

## 2014-09-10 ENCOUNTER — Other Ambulatory Visit (HOSPITAL_BASED_OUTPATIENT_CLINIC_OR_DEPARTMENT_OTHER): Payer: BC Managed Care – PPO | Admitting: Lab

## 2014-09-10 ENCOUNTER — Ambulatory Visit (HOSPITAL_BASED_OUTPATIENT_CLINIC_OR_DEPARTMENT_OTHER): Payer: BC Managed Care – PPO

## 2014-09-10 DIAGNOSIS — Z5112 Encounter for antineoplastic immunotherapy: Secondary | ICD-10-CM | POA: Diagnosis not present

## 2014-09-10 DIAGNOSIS — C9000 Multiple myeloma not having achieved remission: Secondary | ICD-10-CM

## 2014-09-10 LAB — CBC WITH DIFFERENTIAL (CANCER CENTER ONLY)
BASO#: 0 10*3/uL (ref 0.0–0.2)
BASO%: 0.2 % (ref 0.0–2.0)
EOS ABS: 0.3 10*3/uL (ref 0.0–0.5)
EOS%: 3.2 % (ref 0.0–7.0)
HCT: 36.3 % (ref 34.8–46.6)
HEMOGLOBIN: 10.9 g/dL — AB (ref 11.6–15.9)
LYMPH#: 1.3 10*3/uL (ref 0.9–3.3)
LYMPH%: 14.9 % (ref 14.0–48.0)
MCH: 28.9 pg (ref 26.0–34.0)
MCHC: 30 g/dL — ABNORMAL LOW (ref 32.0–36.0)
MCV: 96 fL (ref 81–101)
MONO#: 1.3 10*3/uL — ABNORMAL HIGH (ref 0.1–0.9)
MONO%: 15 % — AB (ref 0.0–13.0)
NEUT#: 5.9 10*3/uL (ref 1.5–6.5)
NEUT%: 66.7 % (ref 39.6–80.0)
Platelets: 346 10*3/uL (ref 145–400)
RBC: 3.77 10*6/uL (ref 3.70–5.32)
RDW: 14.6 % (ref 11.1–15.7)
WBC: 8.9 10*3/uL (ref 3.9–10.0)

## 2014-09-10 MED ORDER — ONDANSETRON HCL 8 MG PO TABS
ORAL_TABLET | ORAL | Status: AC
  Filled 2014-09-10: qty 1

## 2014-09-10 MED ORDER — BORTEZOMIB CHEMO SQ INJECTION 3.5 MG (2.5MG/ML)
1.3000 mg/m2 | Freq: Once | INTRAMUSCULAR | Status: AC
  Administered 2014-09-10: 2.25 mg via SUBCUTANEOUS
  Filled 2014-09-10: qty 2.25

## 2014-09-10 MED ORDER — ONDANSETRON HCL 8 MG PO TABS
8.0000 mg | ORAL_TABLET | Freq: Once | ORAL | Status: AC
Start: 2014-09-10 — End: 2014-09-10
  Administered 2014-09-10: 8 mg via ORAL

## 2014-09-10 NOTE — Progress Notes (Signed)
Pt complains of intermittent right flank pain, especially with deep breath or yawn.  No pain at the present.  Pt  States did not tolerate IVIG well, was very hyper, unable to sleep, dizziness. Explained that she could speak with Dr. Marin Olp before next treatment.

## 2014-09-10 NOTE — Patient Instructions (Signed)
Bortezomib injection What is this medicine? BORTEZOMIB (bor TEZ oh mib) is a chemotherapy drug. It slows the growth of cancer cells. This medicine is used to treat multiple myeloma, and certain lymphomas, such as mantle-cell lymphoma. This medicine may be used for other purposes; ask your health care provider or pharmacist if you have questions. COMMON BRAND NAME(S): Velcade What should I tell my health care provider before I take this medicine? They need to know if you have any of these conditions: -diabetes -heart disease -irregular heartbeat -liver disease -on hemodialysis -low blood counts, like low white blood cells, platelets, or hemoglobin -peripheral neuropathy -taking medicine for blood pressure -an unusual or allergic reaction to bortezomib, mannitol, boron, other medicines, foods, dyes, or preservatives -pregnant or trying to get pregnant -breast-feeding How should I use this medicine? This medicine is for injection into a vein or for injection under the skin. It is given by a health care professional in a hospital or clinic setting. Talk to your pediatrician regarding the use of this medicine in children. Special care may be needed. Overdosage: If you think you have taken too much of this medicine contact a poison control center or emergency room at once. NOTE: This medicine is only for you. Do not share this medicine with others. What if I miss a dose? It is important not to miss your dose. Call your doctor or health care professional if you are unable to keep an appointment. What may interact with this medicine? This medicine may interact with the following medications: -ketoconazole -rifampin -ritonavir -St. John's Wort This list may not describe all possible interactions. Give your health care provider a list of all the medicines, herbs, non-prescription drugs, or dietary supplements you use. Also tell them if you smoke, drink alcohol, or use illegal drugs. Some items  may interact with your medicine. What should I watch for while using this medicine? Visit your doctor for checks on your progress. This drug may make you feel generally unwell. This is not uncommon, as chemotherapy can affect healthy cells as well as cancer cells. Report any side effects. Continue your course of treatment even though you feel ill unless your doctor tells you to stop. You may get drowsy or dizzy. Do not drive, use machinery, or do anything that needs mental alertness until you know how this medicine affects you. Do not stand or sit up quickly, especially if you are an older patient. This reduces the risk of dizzy or fainting spells. In some cases, you may be given additional medicines to help with side effects. Follow all directions for their use. Call your doctor or health care professional for advice if you get a fever, chills or sore throat, or other symptoms of a cold or flu. Do not treat yourself. This drug decreases your body's ability to fight infections. Try to avoid being around people who are sick. This medicine may increase your risk to bruise or bleed. Call your doctor or health care professional if you notice any unusual bleeding. You may need blood work done while you are taking this medicine. In some patients, this medicine may cause a serious brain infection that may cause death. If you have any problems seeing, thinking, speaking, walking, or standing, tell your doctor right away. If you cannot reach your doctor, urgently seek other source of medical care. Do not become pregnant while taking this medicine. Women should inform their doctor if they wish to become pregnant or think they might be pregnant. There is   a potential for serious side effects to an unborn child. Talk to your health care professional or pharmacist for more information. Do not breast-feed an infant while taking this medicine. Check with your doctor or health care professional if you get an attack of  severe diarrhea, nausea and vomiting, or if you sweat a lot. The loss of too much body fluid can make it dangerous for you to take this medicine. What side effects may I notice from receiving this medicine? Side effects that you should report to your doctor or health care professional as soon as possible: -allergic reactions like skin rash, itching or hives, swelling of the face, lips, or tongue -breathing problems -changes in hearing -changes in vision -fast, irregular heartbeat -feeling faint or lightheaded, falls -pain, tingling, numbness in the hands or feet -right upper belly pain -seizures -swelling of the ankles, feet, hands -unusual bleeding or bruising -unusually weak or tired -vomiting -yellowing of the eyes or skin Side effects that usually do not require medical attention (report to your doctor or health care professional if they continue or are bothersome): -changes in emotions or moods -constipation -diarrhea -loss of appetite -headache -irritation at site where injected -nausea This list may not describe all possible side effects. Call your doctor for medical advice about side effects. You may report side effects to FDA at 1-800-FDA-1088. Where should I keep my medicine? This drug is given in a hospital or clinic and will not be stored at home. NOTE: This sheet is a summary. It may not cover all possible information. If you have questions about this medicine, talk to your doctor, pharmacist, or health care provider.  2015, Elsevier/Gold Standard. (2013-06-03 12:46:32)  

## 2014-09-11 ENCOUNTER — Encounter: Payer: Self-pay | Admitting: *Deleted

## 2014-09-11 LAB — COMPREHENSIVE METABOLIC PANEL
ALK PHOS: 89 U/L (ref 39–117)
ALT: 23 U/L (ref 0–35)
AST: 22 U/L (ref 0–37)
Albumin: 3.2 g/dL — ABNORMAL LOW (ref 3.5–5.2)
BILIRUBIN TOTAL: 0.3 mg/dL (ref 0.2–1.2)
BUN: 7 mg/dL (ref 6–23)
CALCIUM: 9 mg/dL (ref 8.4–10.5)
CHLORIDE: 103 meq/L (ref 96–112)
CO2: 29 meq/L (ref 19–32)
Creatinine, Ser: 0.57 mg/dL (ref 0.50–1.10)
Glucose, Bld: 87 mg/dL (ref 70–99)
Potassium: 3.4 mEq/L — ABNORMAL LOW (ref 3.5–5.3)
Sodium: 138 mEq/L (ref 135–145)
TOTAL PROTEIN: 6.9 g/dL (ref 6.0–8.3)

## 2014-09-11 NOTE — Progress Notes (Signed)
[place] Work  Clinical Social Work called pt to check in and follow up on financial concerns and insurance questions. Pt met with HR and is sorting out her insurance options. She was not up to talking, but plans to process her options and call CSW team next week. CSW provided support and let pt know we were here to help as needed. Pt has contact and is aware to call as needed.   Clinical Social Work interventions: Emotional support Resource asst  Loren Racer, [place] Worker [place]  [place] Phone: [phone] Fax: [phone]

## 2014-09-15 ENCOUNTER — Encounter: Payer: Self-pay | Admitting: Oncology

## 2014-09-15 ENCOUNTER — Telehealth: Payer: Self-pay | Admitting: Hematology & Oncology

## 2014-09-15 NOTE — Telephone Encounter (Signed)
[place] for TEACHERS and STATE EMPLOYEES forms completed and faxed today to:  F; [phone] P: [phone] [id number] Jani Files, Case Mgr       COPY SCANNED

## 2014-09-16 ENCOUNTER — Other Ambulatory Visit: Payer: Self-pay | Admitting: *Deleted

## 2014-09-16 DIAGNOSIS — C9000 Multiple myeloma not having achieved remission: Secondary | ICD-10-CM

## 2014-09-16 MED ORDER — LENALIDOMIDE 25 MG PO CAPS: ORAL_CAPSULE | ORAL | Status: DC

## 2014-09-17 ENCOUNTER — Ambulatory Visit: Payer: BC Managed Care – PPO

## 2014-09-17 ENCOUNTER — Other Ambulatory Visit: Payer: BC Managed Care – PPO | Admitting: Lab

## 2014-09-17 ENCOUNTER — Ambulatory Visit: Payer: BC Managed Care – PPO | Admitting: Hematology & Oncology

## 2014-09-18 ENCOUNTER — Ambulatory Visit
Admission: RE | Admit: 2014-09-18 | Payer: BC Managed Care – PPO | Source: Ambulatory Visit | Admitting: Radiation Oncology

## 2014-09-19 ENCOUNTER — Encounter: Payer: Self-pay | Admitting: *Deleted

## 2014-09-19 NOTE — Progress Notes (Signed)
[place] Work  Clinical Social Work phoned pt to check in as CSW had planned to follow up at [place] appointment to assist with financial concerns. CSW phoned pt to check in and left vm for pt to return call. CSW to cont. To follow and assist.   Loren Racer, [place] Worker [place]  [facility] Phone: [phone] Fax: [phone]

## 2014-09-22 ENCOUNTER — Encounter: Payer: Self-pay | Admitting: Emergency Medicine

## 2014-09-22 ENCOUNTER — Telehealth: Payer: Self-pay | Admitting: Emergency Medicine

## 2014-09-22 NOTE — Telephone Encounter (Signed)
Patient dropped off STD form on 09/17/2013 for Dr. Everlene Farrier to complete. This form was put in [employer] box on 09/22/2014 for review and completion. Patient's first time having this form completed at our office. Please return to FMLA/Disabilities upon completion.  Thanks, [employer]

## 2014-09-23 ENCOUNTER — Encounter: Payer: Self-pay | Admitting: Emergency Medicine

## 2014-09-24 ENCOUNTER — Other Ambulatory Visit: Payer: BC Managed Care – PPO | Admitting: Lab

## 2014-09-24 ENCOUNTER — Ambulatory Visit: Payer: BC Managed Care – PPO

## 2014-09-24 ENCOUNTER — Ambulatory Visit (HOSPITAL_BASED_OUTPATIENT_CLINIC_OR_DEPARTMENT_OTHER): Payer: BC Managed Care – PPO

## 2014-09-24 ENCOUNTER — Ambulatory Visit (HOSPITAL_BASED_OUTPATIENT_CLINIC_OR_DEPARTMENT_OTHER): Payer: BC Managed Care – PPO | Admitting: Hematology & Oncology

## 2014-09-24 ENCOUNTER — Encounter: Payer: Self-pay | Admitting: Hematology & Oncology

## 2014-09-24 ENCOUNTER — Ambulatory Visit (HOSPITAL_BASED_OUTPATIENT_CLINIC_OR_DEPARTMENT_OTHER): Payer: BC Managed Care – PPO | Admitting: Lab

## 2014-09-24 VITALS — BP 98/64 | HR 92 | Temp 98.1°F | Resp 16 | Ht 64.0 in | Wt 138.0 lb

## 2014-09-24 DIAGNOSIS — Z5112 Encounter for antineoplastic immunotherapy: Secondary | ICD-10-CM

## 2014-09-24 DIAGNOSIS — C9000 Multiple myeloma not having achieved remission: Secondary | ICD-10-CM

## 2014-09-24 LAB — CMP (CANCER CENTER ONLY)
ALK PHOS: 81 U/L (ref 26–84)
ALT(SGPT): 13 U/L (ref 10–47)
AST: 12 U/L (ref 11–38)
Albumin: 3.1 g/dL — ABNORMAL LOW (ref 3.3–5.5)
BUN, Bld: 11 mg/dL (ref 7–22)
CO2: 28 mEq/L (ref 18–33)
Calcium: 10 mg/dL (ref 8.0–10.3)
Chloride: 100 mEq/L (ref 98–108)
Creat: 0.8 mg/dl (ref 0.6–1.2)
Glucose, Bld: 73 mg/dL (ref 73–118)
Potassium: 4.1 mEq/L (ref 3.3–4.7)
Sodium: 140 mEq/L (ref 128–145)
TOTAL PROTEIN: 7 g/dL (ref 6.4–8.1)
Total Bilirubin: 0.5 mg/dl (ref 0.20–1.60)

## 2014-09-24 LAB — CBC WITH DIFFERENTIAL (CANCER CENTER ONLY)
BASO#: 0.1 10*3/uL (ref 0.0–0.2)
BASO%: 0.8 % (ref 0.0–2.0)
EOS ABS: 0.1 10*3/uL (ref 0.0–0.5)
EOS%: 1.6 % (ref 0.0–7.0)
HCT: 39 % (ref 34.8–46.6)
HGB: 11.8 g/dL (ref 11.6–15.9)
LYMPH#: 1.6 10*3/uL (ref 0.9–3.3)
LYMPH%: 17.5 % (ref 14.0–48.0)
MCH: 29.3 pg (ref 26.0–34.0)
MCHC: 30.3 g/dL — ABNORMAL LOW (ref 32.0–36.0)
MCV: 97 fL (ref 81–101)
MONO#: 1.9 10*3/uL — AB (ref 0.1–0.9)
MONO%: 21 % — ABNORMAL HIGH (ref 0.0–13.0)
NEUT#: 5.3 10*3/uL (ref 1.5–6.5)
NEUT%: 59.1 % (ref 39.6–80.0)
Platelets: 662 10*3/uL — ABNORMAL HIGH (ref 145–400)
RBC: 4.03 10*6/uL (ref 3.70–5.32)
RDW: 15.5 % (ref 11.1–15.7)
WBC: 9 10*3/uL (ref 3.9–10.0)

## 2014-09-24 MED ORDER — SODIUM CHLORIDE 0.9 % IJ SOLN
3.0000 mL | Freq: Once | INTRAMUSCULAR | Status: DC | PRN
  Filled 2014-09-24: qty 10

## 2014-09-24 MED ORDER — ONDANSETRON HCL 8 MG PO TABS
ORAL_TABLET | ORAL | Status: AC
  Filled 2014-09-24: qty 1

## 2014-09-24 MED ORDER — HEPARIN SOD (PORK) LOCK FLUSH 100 UNIT/ML IV SOLN
250.0000 [IU] | Freq: Once | INTRAVENOUS | Status: DC | PRN
  Filled 2014-09-24: qty 5

## 2014-09-24 MED ORDER — BORTEZOMIB CHEMO SQ INJECTION 3.5 MG (2.5MG/ML)
1.3000 mg/m2 | Freq: Once | INTRAMUSCULAR | Status: AC
  Administered 2014-09-24: 2.25 mg via SUBCUTANEOUS
  Filled 2014-09-24: qty 2.25

## 2014-09-24 MED ORDER — ZOLEDRONIC ACID 4 MG/100ML IV SOLN
4.0000 mg | Freq: Once | INTRAVENOUS | Status: AC
  Administered 2014-09-24: 4 mg via INTRAVENOUS
  Filled 2014-09-24: qty 100

## 2014-09-24 MED ORDER — ALTEPLASE 2 MG IJ SOLR
2.0000 mg | Freq: Once | INTRAMUSCULAR | Status: DC | PRN
  Filled 2014-09-24: qty 2

## 2014-09-24 MED ORDER — HEPARIN SOD (PORK) LOCK FLUSH 100 UNIT/ML IV SOLN
500.0000 [IU] | Freq: Once | INTRAVENOUS | Status: DC | PRN
  Filled 2014-09-24: qty 5

## 2014-09-24 MED ORDER — ZOLEDRONIC ACID 4 MG/5ML IV CONC
4.0000 mg | Freq: Once | INTRAVENOUS | Status: DC
  Filled 2014-09-24: qty 5

## 2014-09-24 MED ORDER — ONDANSETRON HCL 8 MG PO TABS
8.0000 mg | ORAL_TABLET | Freq: Once | ORAL | Status: AC
  Administered 2014-09-24: 8 mg via ORAL

## 2014-09-24 MED ORDER — SODIUM CHLORIDE 0.9 % IV SOLN
INTRAVENOUS | Status: DC
  Administered 2014-09-24: 16:00:00 via INTRAVENOUS

## 2014-09-24 MED ORDER — SODIUM CHLORIDE 0.9 % IJ SOLN
10.0000 mL | INTRAMUSCULAR | Status: DC | PRN
  Filled 2014-09-24: qty 10

## 2014-09-24 NOTE — Progress Notes (Signed)
Hematology and Oncology Follow Up Visit  Samantha Moyer [id number] [birth year] 49 y.o. 09/24/2014   Principle Diagnosis:   IgA lambda myeloma  Current Therapy:    Radiation therapy for pathologic fracture of the left humerus  Velcade/Revlimid/Decadron-patient - status post cycle 2  Zometa 4 mg IV every month     Interim History:  Samantha Moyer is in for follow-up. She is doing well. She looks great. Her weight is holding steady.  She is responding nicely to the Velcade and Revlimid. She is tolerating this well.  We last saw her back in January, her monoclonal spike was 1.54 g/L. She had 4 separate monoclonal spikes Back in November, she had 3.92 g/L. her IgA level was down to 1460 mg/dL. Her lambda light chain was down to 38 g/dL. Previously it was 461 mg/dL.  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 This certainly would help explain the aggressive disease that she has.  Her pain is doing much better. She doing some physical therapy for her left arm.  She's had a little nausea but no vomiting. She's had no diarrhea. She's had no rashes or leg swelling.  Overall, her performance status is ECOG 1.  Medications:  Current outpatient prescriptions:  .  aspirin 325 MG EC tablet, Take 325 mg by mouth daily., Disp: , Rfl:  .  dexamethasone (DECADRON) 4 MG tablet, Take 5 pills once a week with food (Patient taking differently: Take 20 mg by mouth once a week. Take 5 pills once a week with food), Disp: 80 tablet, Rfl: 3 .  famciclovir (FAMVIR) 500 MG tablet, Take 1 pill a day (Patient taking differently: Take 500 mg by mouth daily. ), Disp: 30 tablet, Rfl: 6 .  feeding supplement, RESOURCE BREEZE, (RESOURCE BREEZE) LIQD, Take 1 Container by mouth 2 (two) times daily between meals., Disp: 42 Container, Rfl: 0 .  HYDROmorphone (DILAUDID) 2 MG tablet, Take 1 tablet (2 mg total) by mouth every 4 (four) hours as needed for  severe pain., Disp: 180 tablet, Rfl: 0 .  lenalidomide (REVLIMID) 25 MG capsule, Take 1 pill at nighttime for 21 days on and 7 days off. Auth # [id number], Disp: 21 capsule, Rfl: 0 .  Multiple Vitamins-Minerals (ALIVE WOMENS ENERGY) TABS, Take 1 tablet by mouth daily., Disp: , Rfl:  .  ondansetron (ZOFRAN) 8 MG tablet, Take 1 tablet (8 mg total) by mouth 2 (two) times daily. Start the day after chemo for 2 days. Then take as needed for nausea or vomiting., Disp: 30 tablet, Rfl: 1 No current facility-administered medications for this visit.  Facility-Administered Medications Ordered in Other Visits:  .  0.9 %  sodium chloride infusion, , Intravenous, Continuous, Volanda Napoleon, MD, Stopped at 09/24/14 1640 .  alteplase (CATHFLO ACTIVASE) injection 2 mg, 2 mg, Intracatheter, Once PRN, Volanda Napoleon, MD .  heparin lock flush 100 unit/mL, 500 Units, Intracatheter, Once PRN, Volanda Napoleon, MD .  heparin lock flush 100 unit/mL, 250 Units, Intracatheter, Once PRN, Volanda Napoleon, MD .  sodium chloride 0.9 % injection 10 mL, 10 mL, Intracatheter, PRN, Volanda Napoleon, MD .  sodium chloride 0.9 % injection 3 mL, 3 mL, Intravenous, Once PRN, Volanda Napoleon, MD  Allergies:  Allergies  Allergen Reactions  . Codeine Nausea Only  . Tramadol Nausea And Vomiting and Other (See Comments)    Past Medical History, Surgical history, Social  history, and Family History were reviewed and updated.  Review of Systems: As above  Physical Exam:  height is 5\' 4"  (1.626 m) and weight is 138 lb (62.596 kg). Her oral temperature is 98.1 F (36.7 C). Her blood pressure is 98/64 and her pulse is 92. Her respiration is 16.   [name] in no obvious distress. Head and neck exam shows no ocular or oral lesions. There are no palpable cervical or supraclavicular lymph nodes. Lungs are clear. Cardiac exam regular rate and rhythm. There are no murmurs, rubs or bruits. Abdomen is  soft. She has good bowel sounds. There is no fluid wave. There is no palpable liver or spleen tip. Back exam shows no tenderness over the spine, ribs or hips. Extremities shows decreased range motion of the left arm. There is no edema in the legs. She has no weakness in the legs. Skin exam shows no rashes, ecchymoses or petechia. Neurological exam is nonfocal.  Lab Results  Component Value Date   WBC 9.0 09/24/2014   HGB 11.8 09/24/2014   HCT 39.0 09/24/2014   MCV 97 09/24/2014   PLT 662* 09/24/2014     Chemistry      Component Value Date/Time   NA 140 09/24/2014 1341   NA 138 09/10/2014 1408   K 4.1 09/24/2014 1341   K 3.4* 09/10/2014 1408   CL 100 09/24/2014 1341   CL 103 09/10/2014 1408   CO2 28 09/24/2014 1341   CO2 29 09/10/2014 1408   BUN 11 09/24/2014 1341   BUN 7 09/10/2014 1408   CREATININE 0.8 09/24/2014 1341   CREATININE 0.57 09/10/2014 1408      Component Value Date/Time   CALCIUM 10.0 09/24/2014 1341   CALCIUM 9.0 09/10/2014 1408   ALKPHOS 81 09/24/2014 1341   ALKPHOS 89 09/10/2014 1408   AST 12 09/24/2014 1341   AST 22 09/10/2014 1408   ALT 13 09/24/2014 1341   ALT 23 09/10/2014 1408   BILITOT 0.50 09/24/2014 1341   BILITOT 0.3 09/10/2014 1408         Impression and Plan: Samantha Moyer is 48 year old African female. She has fairly aggressive IgA lambda myeloma. She has 2 chromosomal abnormalities.  She's responded very nicely to date. It will be interesting to see what her monoclonal levels are now. Hopefully, we will see last in the way of monoclonal spikes.  I talked to her for about 40 minutes. I explained to her that we have to keep getting her myeloma as low as possible if we want the best outcome with a stem cell transplant.  I told her that she will need a stem cell transplant regardless. She has adverse cytogenetics. She is young. Studies still have shown the advantage to stem cell transplantation in young patients with myeloma.  For now, we  will continue and proceed with cycle #3 of treatment. I told her that she may need more than 4 cycles of therapy. She will need another bone marrow test. I would only do this when I felt that she was ready for stem cell transplantation.  We'll plan to get her back in another month.   Volanda Napoleon, MD 2/3/20165:41 PM

## 2014-09-24 NOTE — Patient Instructions (Signed)
[place] Discharge Instructions for Patients Receiving Chemotherapy  Today you received the following chemotherapy agents:  Velcade  To help prevent nausea and vomiting after your treatment, we encourage you to take your nausea medication.   If you develop nausea and vomiting that is not controlled by your nausea medication, call the clinic.   BELOW ARE SYMPTOMS THAT SHOULD BE REPORTED IMMEDIATELY:  *FEVER GREATER THAN 100.5 F  *CHILLS WITH OR WITHOUT FEVER  NAUSEA AND VOMITING THAT IS NOT CONTROLLED WITH YOUR NAUSEA MEDICATION  *UNUSUAL SHORTNESS OF BREATH  *UNUSUAL BRUISING OR BLEEDING  TENDERNESS IN MOUTH AND THROAT WITH OR WITHOUT PRESENCE OF ULCERS  *URINARY PROBLEMS  *BOWEL PROBLEMS  UNUSUAL RASH Items with * indicate a potential emergency and should be followed up as soon as possible.  Feel free to call the clinic you have any questions or concerns. The clinic phone number is [phone].   Zoledronic Acid injection (Hypercalcemia, Oncology) What is this medicine? ZOLEDRONIC ACID (ZOE le dron ik AS id) lowers the amount of calcium loss from bone. It is used to treat too much calcium in your blood from cancer. It is also used to prevent complications of cancer that has spread to the bone. This medicine may be used for other purposes; ask your health care provider or pharmacist if you have questions. COMMON BRAND NAME(S): Zometa What should I tell my health care provider before I take this medicine? They need to know if you have any of these conditions: -aspirin-sensitive asthma -cancer, especially if you are receiving medicines used to treat cancer -dental disease or wear dentures -infection -kidney disease -receiving corticosteroids like dexamethasone or prednisone -an unusual or allergic reaction to zoledronic acid, other medicines, foods, dyes, or preservatives -pregnant or trying to get pregnant -breast-feeding How should I use this  medicine? This medicine is for infusion into a vein. It is given by a health care professional in a hospital or clinic setting. Talk to your pediatrician regarding the use of this medicine in children. Special care may be needed. Overdosage: If you think you have taken too much of this medicine contact a poison control center or emergency room at once. NOTE: This medicine is only for you. Do not share this medicine with others. What if I miss a dose? It is important not to miss your dose. Call your doctor or health care professional if you are unable to keep an appointment. What may interact with this medicine? -certain antibiotics given by injection -NSAIDs, medicines for pain and inflammation, like ibuprofen or naproxen -some diuretics like bumetanide, furosemide -teriparatide -thalidomide This list may not describe all possible interactions. Give your health care provider a list of all the medicines, herbs, non-prescription drugs, or dietary supplements you use. Also tell them if you smoke, drink alcohol, or use illegal drugs. Some items may interact with your medicine. What should I watch for while using this medicine? Visit your doctor or health care professional for regular checkups. It may be some time before you see the benefit from this medicine. Do not stop taking your medicine unless your doctor tells you to. Your doctor may order blood tests or other tests to see how you are doing. Women should inform their doctor if they wish to become pregnant or think they might be pregnant. There is a potential for serious side effects to an unborn child. Talk to your health care professional or pharmacist for more information. You should make sure that you get enough  calcium and vitamin D while you are taking this medicine. Discuss the foods you eat and the vitamins you take with your health care professional. Some people who take this medicine have severe bone, joint, and/or muscle pain. This  medicine may also increase your risk for jaw problems or a broken thigh bone. Tell your doctor right away if you have severe pain in your jaw, bones, joints, or muscles. Tell your doctor if you have any pain that does not go away or that gets worse. Tell your dentist and dental surgeon that you are taking this medicine. You should not have major dental surgery while on this medicine. See your dentist to have a dental exam and fix any dental problems before starting this medicine. Take good care of your teeth while on this medicine. Make sure you see your dentist for regular follow-up appointments. What side effects may I notice from receiving this medicine? Side effects that you should report to your doctor or health care professional as soon as possible: -allergic reactions like skin rash, itching or hives, swelling of the face, lips, or tongue -anxiety, confusion, or depression -breathing problems -changes in vision -eye pain -feeling faint or lightheaded, falls -jaw pain, especially after dental work -mouth sores -muscle cramps, stiffness, or weakness -trouble passing urine or change in the amount of urine Side effects that usually do not require medical attention (report to your doctor or health care professional if they continue or are bothersome): -bone, joint, or muscle pain -constipation -diarrhea -fever -hair loss -irritation at site where injected -loss of appetite -nausea, vomiting -stomach upset -trouble sleeping -trouble swallowing -weak or tired This list may not describe all possible side effects. Call your doctor for medical advice about side effects. You may report side effects to FDA at 1-800-FDA-1088. Where should I keep my medicine? This drug is given in a hospital or clinic and will not be stored at home. NOTE: This sheet is a summary. It may not cover all possible information. If you have questions about this medicine, talk to your doctor, pharmacist, or health  care provider.  2015, Elsevier/Gold Standard. (2013-01-17 13:03:13) Bortezomib injection What is this medicine? BORTEZOMIB (bor TEZ oh mib) is a chemotherapy drug. It slows the growth of cancer cells. This medicine is used to treat multiple myeloma, and certain lymphomas, such as mantle-cell lymphoma. This medicine may be used for other purposes; ask your health care provider or pharmacist if you have questions. COMMON BRAND NAME(S): Velcade What should I tell my health care provider before I take this medicine? They need to know if you have any of these conditions: -diabetes -heart disease -irregular heartbeat -liver disease -on hemodialysis -low blood counts, like low white blood cells, platelets, or hemoglobin -peripheral neuropathy -taking medicine for blood pressure -an unusual or allergic reaction to bortezomib, mannitol, boron, other medicines, foods, dyes, or preservatives -pregnant or trying to get pregnant -breast-feeding How should I use this medicine? This medicine is for injection into a vein or for injection under the skin. It is given by a health care professional in a hospital or clinic setting. Talk to your pediatrician regarding the use of this medicine in children. Special care may be needed. Overdosage: If you think you have taken too much of this medicine contact a poison control center or emergency room at once. NOTE: This medicine is only for you. Do not share this medicine with others. What if I miss a dose? It is important not to miss your dose.  Call your doctor or health care professional if you are unable to keep an appointment. What may interact with this medicine? This medicine may interact with the following medications: -ketoconazole -rifampin -ritonavir -St. John's Wort This list may not describe all possible interactions. Give your health care provider a list of all the medicines, herbs, non-prescription drugs, or dietary supplements you use. Also  tell them if you smoke, drink alcohol, or use illegal drugs. Some items may interact with your medicine. What should I watch for while using this medicine? Visit your doctor for checks on your progress. This drug may make you feel generally unwell. This is not uncommon, as chemotherapy can affect healthy cells as well as cancer cells. Report any side effects. Continue your course of treatment even though you feel ill unless your doctor tells you to stop. You may get drowsy or dizzy. Do not drive, use machinery, or do anything that needs mental alertness until you know how this medicine affects you. Do not stand or sit up quickly, especially if you are an older patient. This reduces the risk of dizzy or fainting spells. In some cases, you may be given additional medicines to help with side effects. Follow all directions for their use. Call your doctor or health care professional for advice if you get a fever, chills or sore throat, or other symptoms of a cold or flu. Do not treat yourself. This drug decreases your body's ability to fight infections. Try to avoid being around people who are sick. This medicine may increase your risk to bruise or bleed. Call your doctor or health care professional if you notice any unusual bleeding. You may need blood work done while you are taking this medicine. In some patients, this medicine may cause a serious brain infection that may cause death. If you have any problems seeing, thinking, speaking, walking, or standing, tell your doctor right away. If you cannot reach your doctor, urgently seek other source of medical care. Do not become pregnant while taking this medicine. Women should inform their doctor if they wish to become pregnant or think they might be pregnant. There is a potential for serious side effects to an unborn child. Talk to your health care professional or pharmacist for more information. Do not breast-feed an infant while taking this medicine. Check  with your doctor or health care professional if you get an attack of severe diarrhea, nausea and vomiting, or if you sweat a lot. The loss of too much body fluid can make it dangerous for you to take this medicine. What side effects may I notice from receiving this medicine? Side effects that you should report to your doctor or health care professional as soon as possible: -allergic reactions like skin rash, itching or hives, swelling of the face, lips, or tongue -breathing problems -changes in hearing -changes in vision -fast, irregular heartbeat -feeling faint or lightheaded, falls -pain, tingling, numbness in the hands or feet -right upper belly pain -seizures -swelling of the ankles, feet, hands -unusual bleeding or bruising -unusually weak or tired -vomiting -yellowing of the eyes or skin Side effects that usually do not require medical attention (report to your doctor or health care professional if they continue or are bothersome): -changes in emotions or moods -constipation -diarrhea -loss of appetite -headache -irritation at site where injected -nausea This list may not describe all possible side effects. Call your doctor for medical advice about side effects. You may report side effects to FDA at 1-800-FDA-1088. Where should  I keep my medicine? This drug is given in a hospital or clinic and will not be stored at home. NOTE: This sheet is a summary. It may not cover all possible information. If you have questions about this medicine, talk to your doctor, pharmacist, or health care provider.  2015, Elsevier/Gold Standard. (2013-06-03 12:46:32)

## 2014-09-25 ENCOUNTER — Telehealth: Payer: Self-pay | Admitting: Hematology & Oncology

## 2014-09-25 NOTE — Telephone Encounter (Signed)
[place] co-pay assistance papers today to:  P: [phone] F: [phone]

## 2014-09-25 NOTE — Telephone Encounter (Signed)
Pt in ofc today to pick up completed  FMLA/DISABILITY papers.       COPY SCANNED

## 2014-09-29 ENCOUNTER — Telehealth: Payer: Self-pay

## 2014-09-29 ENCOUNTER — Telehealth: Payer: Self-pay | Admitting: Hematology & Oncology

## 2014-09-29 LAB — RETICULOCYTES (CHCC)
ABS Retic: 44.7 10*3/uL (ref 19.0–186.0)
RBC.: 4.06 MIL/uL (ref 3.87–5.11)
RETIC CT PCT: 1.1 % (ref 0.4–2.3)

## 2014-09-29 LAB — PROTEIN ELECTROPHORESIS, SERUM, WITH REFLEX
Albumin ELP: 49.9 % — ABNORMAL LOW (ref 55.8–66.1)
Alpha-1-Globulin: 4.9 % (ref 2.9–4.9)
Alpha-2-Globulin: 12.6 % — ABNORMAL HIGH (ref 7.1–11.8)
BETA GLOBULIN: 11.5 % — AB (ref 4.7–7.2)
Beta 2: 4.2 % (ref 3.2–6.5)
Gamma Globulin: 16.9 % (ref 11.1–18.8)
M-Spike, %: 0.46 g/dL
Total Protein, Serum Electrophoresis: 6.8 g/dL (ref 6.0–8.3)

## 2014-09-29 LAB — IFE INTERPRETATION

## 2014-09-29 LAB — BETA 2 MICROGLOBULIN, SERUM: Beta-2 Microglobulin: 2.46 mg/L (ref <=2.51)

## 2014-09-29 LAB — KAPPA/LAMBDA LIGHT CHAINS
Kappa free light chain: 1.62 mg/dL (ref 0.33–1.94)
Kappa:Lambda Ratio: 0.15 — ABNORMAL LOW (ref 0.26–1.65)
Lambda Free Lght Chn: 10.6 mg/dL — ABNORMAL HIGH (ref 0.57–2.63)

## 2014-09-29 LAB — LACTATE DEHYDROGENASE: LDH: 148 U/L (ref 94–250)

## 2014-09-29 LAB — IGG, IGA, IGM
IgA: 632 mg/dL — ABNORMAL HIGH (ref 69–380)
IgG (Immunoglobin G), Serum: 1620 mg/dL (ref 690–1700)
IgM, Serum: 64 mg/dL (ref 52–322)

## 2014-09-29 LAB — ERYTHROPOIETIN: Erythropoietin: 13.8 m[IU]/mL (ref 2.6–18.5)

## 2014-09-29 LAB — IGE: IGE (IMMUNOGLOBULIN E), SERUM: 28 kU/L (ref <115)

## 2014-09-29 NOTE — Telephone Encounter (Signed)
Per [facility] No PPA required for CPT Codes (s): [id number], [id number]. Active policy

## 2014-09-29 NOTE — Telephone Encounter (Addendum)
-----   Message from Volanda Napoleon, MD sent at 09/26/2014  7:15 PM EST ----- Call - myeloma is still getting better!!!  Above Message given to pt via phone. Verbalizes understanding. dph

## 2014-09-30 ENCOUNTER — Encounter: Payer: Self-pay | Admitting: *Deleted

## 2014-09-30 NOTE — Progress Notes (Signed)
[place] Work  Clinical Social Work met with patient in office at [facility] to offer support and assess for needs.  Patient has expressed financial concerns and information on resources.  Patient will be receiving short term disability (50% of her income), but due to the % awarded will still have financial stressors.  Patient is currently paying her money premium to maintain her insurance and has signed up on the Marketplace which will be active in April.  CSW and patient discussed resources available.  Patient received the Cancer Care application in the mail, but still needs supporting documents.  Patient plans to bring documents to next appointment with CSW at that time CSW will submit application.  CSW and patient discussed other support services at [facility].  Patient is attending the multiple myeloma group today and is interested in additional emotional support.  CSW and patient discussed the counseling opportunities at [facility].  Patient is scheduled to meet with CSW next week.  CSW provided contact information and encouraged patient to call with questions or concerns.  Johnnye Lana, MSW, LCSW, OSW-C Clinical Social Worker [facility] [phone]

## 2014-10-01 ENCOUNTER — Ambulatory Visit (HOSPITAL_BASED_OUTPATIENT_CLINIC_OR_DEPARTMENT_OTHER): Payer: BC Managed Care – PPO

## 2014-10-01 ENCOUNTER — Other Ambulatory Visit (HOSPITAL_BASED_OUTPATIENT_CLINIC_OR_DEPARTMENT_OTHER): Payer: BC Managed Care – PPO | Admitting: Lab

## 2014-10-01 DIAGNOSIS — C9000 Multiple myeloma not having achieved remission: Secondary | ICD-10-CM

## 2014-10-01 DIAGNOSIS — Z5112 Encounter for antineoplastic immunotherapy: Secondary | ICD-10-CM

## 2014-10-01 LAB — COMPREHENSIVE METABOLIC PANEL
ALK PHOS: 97 U/L (ref 39–117)
ALT: 10 U/L (ref 0–35)
AST: 13 U/L (ref 0–37)
Albumin: 3.5 g/dL (ref 3.5–5.2)
BUN: 8 mg/dL (ref 6–23)
CO2: 28 meq/L (ref 19–32)
Calcium: 9.5 mg/dL (ref 8.4–10.5)
Chloride: 100 mEq/L (ref 96–112)
Creatinine, Ser: 0.59 mg/dL (ref 0.50–1.10)
Glucose, Bld: 87 mg/dL (ref 70–99)
POTASSIUM: 3.7 meq/L (ref 3.5–5.3)
SODIUM: 137 meq/L (ref 135–145)
TOTAL PROTEIN: 6.4 g/dL (ref 6.0–8.3)
Total Bilirubin: 0.3 mg/dL (ref 0.2–1.2)

## 2014-10-01 LAB — CBC WITH DIFFERENTIAL (CANCER CENTER ONLY)
BASO#: 0 10*3/uL (ref 0.0–0.2)
BASO%: 0.4 % (ref 0.0–2.0)
EOS%: 3.4 % (ref 0.0–7.0)
Eosinophils Absolute: 0.3 10*3/uL (ref 0.0–0.5)
HCT: 37.3 % (ref 34.8–46.6)
HGB: 11.4 g/dL — ABNORMAL LOW (ref 11.6–15.9)
LYMPH#: 1.4 10*3/uL (ref 0.9–3.3)
LYMPH%: 16.3 % (ref 14.0–48.0)
MCH: 29.2 pg (ref 26.0–34.0)
MCHC: 30.6 g/dL — AB (ref 32.0–36.0)
MCV: 96 fL (ref 81–101)
MONO#: 0.5 10*3/uL (ref 0.1–0.9)
MONO%: 5.6 % (ref 0.0–13.0)
NEUT%: 74.3 % (ref 39.6–80.0)
NEUTROS ABS: 6.3 10*3/uL (ref 1.5–6.5)
PLATELETS: 382 10*3/uL (ref 145–400)
RBC: 3.9 10*6/uL (ref 3.70–5.32)
RDW: 15.6 % (ref 11.1–15.7)
WBC: 8.5 10*3/uL (ref 3.9–10.0)

## 2014-10-01 MED ORDER — ONDANSETRON HCL 8 MG PO TABS
ORAL_TABLET | ORAL | Status: AC
  Filled 2014-10-01: qty 1

## 2014-10-01 MED ORDER — BORTEZOMIB CHEMO SQ INJECTION 3.5 MG (2.5MG/ML)
1.3000 mg/m2 | Freq: Once | INTRAMUSCULAR | Status: AC
  Administered 2014-10-01: 2.25 mg via SUBCUTANEOUS
  Filled 2014-10-01: qty 2.25

## 2014-10-01 MED ORDER — ONDANSETRON HCL 8 MG PO TABS
8.0000 mg | ORAL_TABLET | Freq: Once | ORAL | Status: AC
  Administered 2014-10-01: 8 mg via ORAL

## 2014-10-01 NOTE — Patient Instructions (Signed)
Bortezomib injection What is this medicine? BORTEZOMIB (bor TEZ oh mib) is a chemotherapy drug. It slows the growth of cancer cells. This medicine is used to treat multiple myeloma, and certain lymphomas, such as mantle-cell lymphoma. This medicine may be used for other purposes; ask your health care provider or pharmacist if you have questions. COMMON BRAND NAME(S): Velcade What should I tell my health care provider before I take this medicine? They need to know if you have any of these conditions: -diabetes -heart disease -irregular heartbeat -liver disease -on hemodialysis -low blood counts, like low white blood cells, platelets, or hemoglobin -peripheral neuropathy -taking medicine for blood pressure -an unusual or allergic reaction to bortezomib, mannitol, boron, other medicines, foods, dyes, or preservatives -pregnant or trying to get pregnant -breast-feeding How should I use this medicine? This medicine is for injection into a vein or for injection under the skin. It is given by a health care professional in a hospital or clinic setting. Talk to your pediatrician regarding the use of this medicine in children. Special care may be needed. Overdosage: If you think you have taken too much of this medicine contact a poison control center or emergency room at once. NOTE: This medicine is only for you. Do not share this medicine with others. What if I miss a dose? It is important not to miss your dose. Call your doctor or health care professional if you are unable to keep an appointment. What may interact with this medicine? This medicine may interact with the following medications: -ketoconazole -rifampin -ritonavir -St. John's Wort This list may not describe all possible interactions. Give your health care provider a list of all the medicines, herbs, non-prescription drugs, or dietary supplements you use. Also tell them if you smoke, drink alcohol, or use illegal drugs. Some items  may interact with your medicine. What should I watch for while using this medicine? Visit your doctor for checks on your progress. This drug may make you feel generally unwell. This is not uncommon, as chemotherapy can affect healthy cells as well as cancer cells. Report any side effects. Continue your course of treatment even though you feel ill unless your doctor tells you to stop. You may get drowsy or dizzy. Do not drive, use machinery, or do anything that needs mental alertness until you know how this medicine affects you. Do not stand or sit up quickly, especially if you are an older patient. This reduces the risk of dizzy or fainting spells. In some cases, you may be given additional medicines to help with side effects. Follow all directions for their use. Call your doctor or health care professional for advice if you get a fever, chills or sore throat, or other symptoms of a cold or flu. Do not treat yourself. This drug decreases your body's ability to fight infections. Try to avoid being around people who are sick. This medicine may increase your risk to bruise or bleed. Call your doctor or health care professional if you notice any unusual bleeding. You may need blood work done while you are taking this medicine. In some patients, this medicine may cause a serious brain infection that may cause death. If you have any problems seeing, thinking, speaking, walking, or standing, tell your doctor right away. If you cannot reach your doctor, urgently seek other source of medical care. Do not become pregnant while taking this medicine. Women should inform their doctor if they wish to become pregnant or think they might be pregnant. There is   a potential for serious side effects to an unborn child. Talk to your health care professional or pharmacist for more information. Do not breast-feed an infant while taking this medicine. Check with your doctor or health care professional if you get an attack of  severe diarrhea, nausea and vomiting, or if you sweat a lot. The loss of too much body fluid can make it dangerous for you to take this medicine. What side effects may I notice from receiving this medicine? Side effects that you should report to your doctor or health care professional as soon as possible: -allergic reactions like skin rash, itching or hives, swelling of the face, lips, or tongue -breathing problems -changes in hearing -changes in vision -fast, irregular heartbeat -feeling faint or lightheaded, falls -pain, tingling, numbness in the hands or feet -right upper belly pain -seizures -swelling of the ankles, feet, hands -unusual bleeding or bruising -unusually weak or tired -vomiting -yellowing of the eyes or skin Side effects that usually do not require medical attention (report to your doctor or health care professional if they continue or are bothersome): -changes in emotions or moods -constipation -diarrhea -loss of appetite -headache -irritation at site where injected -nausea This list may not describe all possible side effects. Call your doctor for medical advice about side effects. You may report side effects to FDA at 1-800-FDA-1088. Where should I keep my medicine? This drug is given in a hospital or clinic and will not be stored at home. NOTE: This sheet is a summary. It may not cover all possible information. If you have questions about this medicine, talk to your doctor, pharmacist, or health care provider.  2015, Elsevier/Gold Standard. (2013-06-03 12:46:32)  

## 2014-10-01 NOTE — Progress Notes (Signed)
3:03 PM Pt requests to have ASA dose reduced, Dr. Marin Olp notified. OK to decrease to 81 mg daily.

## 2014-10-07 ENCOUNTER — Encounter: Payer: Self-pay | Admitting: *Deleted

## 2014-10-07 NOTE — Progress Notes (Signed)
[place] Work  Holiday representative met with patient in office at [facility] for scheduled counseling session.  CSW provided a space for patient to express her feelings, concerns, and adjusting to her diagnosis.  Patient described her emotional experience since being diagnosed with cancer and the difficulties adjusting to the drastic change it has had on her life.  CSW provided supportive listening and normalized patients feelings.  CSW and patient also discussed interventions and opportunities that would positively support patient in processing her thoughts and feelings.  CSw and patient are scheduled to meet in 2 weeks to follow up from today's session and further explore patients needs.  CSW encouraged patient to call with any questions or concerns.  CSW and patient also reviewed Cancer Care application; which was completed and faxed.        Johnnye Lana, MSW, LCSW, OSW-C Clinical Social Worker [facility] [phone]

## 2014-10-08 ENCOUNTER — Ambulatory Visit (HOSPITAL_BASED_OUTPATIENT_CLINIC_OR_DEPARTMENT_OTHER)
Admission: RE | Admit: 2014-10-08 | Discharge: 2014-10-08 | Disposition: A | Discharge status: Home / Self Care | Payer: BC Managed Care – PPO | Source: Ambulatory Visit | Attending: Hematology & Oncology | Admitting: Hematology & Oncology

## 2014-10-08 ENCOUNTER — Other Ambulatory Visit (HOSPITAL_BASED_OUTPATIENT_CLINIC_OR_DEPARTMENT_OTHER): Payer: BC Managed Care – PPO | Admitting: Lab

## 2014-10-08 ENCOUNTER — Ambulatory Visit (HOSPITAL_BASED_OUTPATIENT_CLINIC_OR_DEPARTMENT_OTHER): Payer: BC Managed Care – PPO

## 2014-10-08 DIAGNOSIS — C9000 Multiple myeloma not having achieved remission: Secondary | ICD-10-CM

## 2014-10-08 DIAGNOSIS — M79622 Pain in left upper arm: Secondary | ICD-10-CM | POA: Insufficient documentation

## 2014-10-08 DIAGNOSIS — Z5112 Encounter for antineoplastic immunotherapy: Secondary | ICD-10-CM

## 2014-10-08 LAB — CMP (CANCER CENTER ONLY)
ALBUMIN: 3.1 g/dL — AB (ref 3.3–5.5)
ALT: 14 U/L (ref 10–47)
AST: 20 U/L (ref 11–38)
Alkaline Phosphatase: 79 U/L (ref 26–84)
BUN, Bld: 9 mg/dL (ref 7–22)
CO2: 28 mEq/L (ref 18–33)
CREATININE: 0.8 mg/dL (ref 0.6–1.2)
Calcium: 8.7 mg/dL (ref 8.0–10.3)
Chloride: 101 mEq/L (ref 98–108)
Glucose, Bld: 74 mg/dL (ref 73–118)
Potassium: 3.4 mEq/L (ref 3.3–4.7)
SODIUM: 137 meq/L (ref 128–145)
TOTAL PROTEIN: 6.6 g/dL (ref 6.4–8.1)
Total Bilirubin: 0.5 mg/dl (ref 0.20–1.60)

## 2014-10-08 LAB — CBC WITH DIFFERENTIAL (CANCER CENTER ONLY)
BASO#: 0 10*3/uL (ref 0.0–0.2)
BASO%: 0.2 % (ref 0.0–2.0)
EOS%: 3.5 % (ref 0.0–7.0)
Eosinophils Absolute: 0.4 10*3/uL (ref 0.0–0.5)
HEMATOCRIT: 36.8 % (ref 34.8–46.6)
HGB: 11 g/dL — ABNORMAL LOW (ref 11.6–15.9)
LYMPH#: 1.6 10*3/uL (ref 0.9–3.3)
LYMPH%: 13.7 % — AB (ref 14.0–48.0)
MCH: 28.7 pg (ref 26.0–34.0)
MCHC: 29.9 g/dL — AB (ref 32.0–36.0)
MCV: 96 fL (ref 81–101)
MONO#: 2.3 10*3/uL — ABNORMAL HIGH (ref 0.1–0.9)
MONO%: 19.5 % — AB (ref 0.0–13.0)
NEUT#: 7.3 10*3/uL — ABNORMAL HIGH (ref 1.5–6.5)
NEUT%: 63.1 % (ref 39.6–80.0)
PLATELETS: 290 10*3/uL (ref 145–400)
RBC: 3.83 10*6/uL (ref 3.70–5.32)
RDW: 15.5 % (ref 11.1–15.7)
WBC: 11.5 10*3/uL — ABNORMAL HIGH (ref 3.9–10.0)

## 2014-10-08 MED ORDER — ONDANSETRON HCL 8 MG PO TABS
ORAL_TABLET | ORAL | Status: AC
  Filled 2014-10-08: qty 1

## 2014-10-08 MED ORDER — ONDANSETRON HCL 8 MG PO TABS
8.0000 mg | ORAL_TABLET | Freq: Once | ORAL | Status: AC
  Administered 2014-10-08: 8 mg via ORAL

## 2014-10-08 MED ORDER — BORTEZOMIB CHEMO SQ INJECTION 3.5 MG (2.5MG/ML)
1.3000 mg/m2 | Freq: Once | INTRAMUSCULAR | Status: AC
  Administered 2014-10-08: 2.25 mg via SUBCUTANEOUS
  Filled 2014-10-08: qty 2.25

## 2014-10-08 NOTE — Patient Instructions (Signed)
Bortezomib injection What is this medicine? BORTEZOMIB (bor TEZ oh mib) is a chemotherapy drug. It slows the growth of cancer cells. This medicine is used to treat multiple myeloma, and certain lymphomas, such as mantle-cell lymphoma. This medicine may be used for other purposes; ask your health care provider or pharmacist if you have questions. COMMON BRAND NAME(S): Velcade What should I tell my health care provider before I take this medicine? They need to know if you have any of these conditions: -diabetes -heart disease -irregular heartbeat -liver disease -on hemodialysis -low blood counts, like low white blood cells, platelets, or hemoglobin -peripheral neuropathy -taking medicine for blood pressure -an unusual or allergic reaction to bortezomib, mannitol, boron, other medicines, foods, dyes, or preservatives -pregnant or trying to get pregnant -breast-feeding How should I use this medicine? This medicine is for injection into a vein or for injection under the skin. It is given by a health care professional in a hospital or clinic setting. Talk to your pediatrician regarding the use of this medicine in children. Special care may be needed. Overdosage: If you think you have taken too much of this medicine contact a poison control center or emergency room at once. NOTE: This medicine is only for you. Do not share this medicine with others. What if I miss a dose? It is important not to miss your dose. Call your doctor or health care professional if you are unable to keep an appointment. What may interact with this medicine? This medicine may interact with the following medications: -ketoconazole -rifampin -ritonavir -St. John's Wort This list may not describe all possible interactions. Give your health care provider a list of all the medicines, herbs, non-prescription drugs, or dietary supplements you use. Also tell them if you smoke, drink alcohol, or use illegal drugs. Some items  may interact with your medicine. What should I watch for while using this medicine? Visit your doctor for checks on your progress. This drug may make you feel generally unwell. This is not uncommon, as chemotherapy can affect healthy cells as well as cancer cells. Report any side effects. Continue your course of treatment even though you feel ill unless your doctor tells you to stop. You may get drowsy or dizzy. Do not drive, use machinery, or do anything that needs mental alertness until you know how this medicine affects you. Do not stand or sit up quickly, especially if you are an older patient. This reduces the risk of dizzy or fainting spells. In some cases, you may be given additional medicines to help with side effects. Follow all directions for their use. Call your doctor or health care professional for advice if you get a fever, chills or sore throat, or other symptoms of a cold or flu. Do not treat yourself. This drug decreases your body's ability to fight infections. Try to avoid being around people who are sick. This medicine may increase your risk to bruise or bleed. Call your doctor or health care professional if you notice any unusual bleeding. You may need blood work done while you are taking this medicine. In some patients, this medicine may cause a serious brain infection that may cause death. If you have any problems seeing, thinking, speaking, walking, or standing, tell your doctor right away. If you cannot reach your doctor, urgently seek other source of medical care. Do not become pregnant while taking this medicine. Women should inform their doctor if they wish to become pregnant or think they might be pregnant. There is   a potential for serious side effects to an unborn child. Talk to your health care professional or pharmacist for more information. Do not breast-feed an infant while taking this medicine. Check with your doctor or health care professional if you get an attack of  severe diarrhea, nausea and vomiting, or if you sweat a lot. The loss of too much body fluid can make it dangerous for you to take this medicine. What side effects may I notice from receiving this medicine? Side effects that you should report to your doctor or health care professional as soon as possible: -allergic reactions like skin rash, itching or hives, swelling of the face, lips, or tongue -breathing problems -changes in hearing -changes in vision -fast, irregular heartbeat -feeling faint or lightheaded, falls -pain, tingling, numbness in the hands or feet -right upper belly pain -seizures -swelling of the ankles, feet, hands -unusual bleeding or bruising -unusually weak or tired -vomiting -yellowing of the eyes or skin Side effects that usually do not require medical attention (report to your doctor or health care professional if they continue or are bothersome): -changes in emotions or moods -constipation -diarrhea -loss of appetite -headache -irritation at site where injected -nausea This list may not describe all possible side effects. Call your doctor for medical advice about side effects. You may report side effects to FDA at 1-800-FDA-1088. Where should I keep my medicine? This drug is given in a hospital or clinic and will not be stored at home. NOTE: This sheet is a summary. It may not cover all possible information. If you have questions about this medicine, talk to your doctor, pharmacist, or health care provider.  2015, Elsevier/Gold Standard. (2013-06-03 12:46:32)  

## 2014-10-08 NOTE — Addendum Note (Signed)
Addended by: Burney Gauze R on: 10/08/2014 12:43 PM   Modules accepted: Orders

## 2014-10-14 ENCOUNTER — Other Ambulatory Visit: Payer: Self-pay | Admitting: Nurse Practitioner

## 2014-10-14 DIAGNOSIS — Z0271 Encounter for disability determination: Secondary | ICD-10-CM

## 2014-10-14 DIAGNOSIS — C9000 Multiple myeloma not having achieved remission: Secondary | ICD-10-CM

## 2014-10-14 MED ORDER — LENALIDOMIDE 25 MG PO CAPS: ORAL_CAPSULE | ORAL | Status: DC

## 2014-10-14 NOTE — Progress Notes (Addendum)
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 She may need more than 4 cycles of therapy.  Pain on a scale of 0-10 is: 0. Has some soreness when she moves her left arm a certain way.  Ambulatory status? Walker? Wheelchair?: ambulatory SAFETY ISSUES:  Prior radiation?07/31/14-08/13/14 - left humerus/shoulder 30 Gy   Pacemaker/ICD? no  Possible current pregnancy? no  Is the patient on methotrexate? no  Current Complaints / other details:  Patient denies pain.  She states that she thinks the pain was muscle pain. It is better now.  She is here with a friend today.  BP 95/58 mmHg  Pulse 80  Temp(Src) 97.6 F (36.4 C) (Oral)  Resp 20  Ht 5\' 4"  (1.626 m)  Wt 146 lb 4.8 oz (66.361 kg)  BMI 25.10 kg/m2

## 2014-10-15 ENCOUNTER — Ambulatory Visit
Admission: RE | Admit: 2014-10-15 | Discharge: 2014-10-15 | Disposition: A | Discharge status: Home / Self Care | Payer: BC Managed Care – PPO | Source: Ambulatory Visit | Attending: Radiation Oncology | Admitting: Radiation Oncology

## 2014-10-15 ENCOUNTER — Encounter: Payer: Self-pay | Admitting: Radiation Oncology

## 2014-10-15 VITALS — BP 95/58 | HR 80 | Temp 97.6°F | Resp 20 | Ht 64.0 in | Wt 146.3 lb

## 2014-10-15 DIAGNOSIS — Z923 Personal history of irradiation: Secondary | ICD-10-CM | POA: Diagnosis not present

## 2014-10-15 DIAGNOSIS — Z7982 Long term (current) use of aspirin: Secondary | ICD-10-CM | POA: Diagnosis not present

## 2014-10-15 DIAGNOSIS — C9000 Multiple myeloma not having achieved remission: Secondary | ICD-10-CM | POA: Diagnosis not present

## 2014-10-15 DIAGNOSIS — Z79899 Other long term (current) drug therapy: Secondary | ICD-10-CM | POA: Insufficient documentation

## 2014-10-15 NOTE — Progress Notes (Signed)
Please see the Nurse Progress Note in the MD Initial Consult Encounter for this patient. 

## 2014-10-15 NOTE — Progress Notes (Signed)
Radiation Oncology         [phone] ________________________________  Name: Samantha Moyer MRN: [id number]  Date: 10/15/2014  DOB: [birth year]  Re-Evaluation Note  CC: Jenny Reichmann, MD  Volanda Napoleon, MD    ICD-9-CM ICD-10-CM   1. Myeloma 203.00 C90.00 Ambulatory referral to Social Work  2. IgA myeloma 203.00 C90.00     Diagnosis:   IgA lambda myeloma  Current Therapy:   Radiation therapy for pathologic fracture of the left humerus, completed 08/13/14, Left humerus/shoulder - 30 Gy  Velcade/Revlimid/Decadron-patient - status post cycle 2  Zometa 4 mg IV every month  Interval Since Last Radiation:  2  months  Narrative:  The patient returns today for further evaluation at the courtesy of Dr. Burney Gauze. Patient has been undergoing treatment as above for her myeloma. She has been doing well up until recently when she used her left arm quite a bit, with the patient terms "over doing it" and subsequently developed pain in the left proximal arm.  A plain x-ray was performed which showed a lytic lesion within the left proximal humerus with possibly mild progression in the medial cortical destruction and cortical reaction medial and lateral.  the patient's intramedullary rod and fixation screws appeared to be in good position. There was no pathologic fracture.  a small new lesion in the midshaft of the left humerus was noted. In light of the x-ray findings and the patient's reports of pain she is now seen in radiation oncology for consideration for additional treatments directed at the left humerus.                              ALLERGIES:  is allergic to codeine and tramadol.  Meds: Current Outpatient Prescriptions  Medication Sig Dispense Refill  . aspirin EC 81 MG tablet Take 81 mg by mouth daily.    Marland Kitchen dexamethasone (DECADRON) 4 MG tablet Take 5 pills once a week with food (Patient taking differently: Take 20 mg by mouth once a week. Take 5 pills once a week with food)  80 tablet 3  . famciclovir (FAMVIR) 500 MG tablet Take 1 pill a day (Patient taking differently: Take 500 mg by mouth daily. ) 30 tablet 6  . feeding supplement, RESOURCE BREEZE, (RESOURCE BREEZE) LIQD Take 1 Container by mouth 2 (two) times daily between meals. 60 Container 0  . lenalidomide (REVLIMID) 25 MG capsule Take 1 pill at nighttime for 21 days on and 7 days off. Auth # [id number] 21 capsule 0  . Multiple Vitamins-Minerals (ALIVE WOMENS ENERGY) TABS Take 1 tablet by mouth daily.    . ondansetron (ZOFRAN) 8 MG tablet Take 1 tablet (8 mg total) by mouth 2 (two) times daily. Start the day after chemo for 2 days. Then take as needed for nausea or vomiting. 30 tablet 1  . HYDROmorphone (DILAUDID) 2 MG tablet Take 1 tablet (2 mg total) by mouth every 4 (four) hours as needed for severe pain. (Patient not taking: Reported on 10/15/2014) 180 tablet 0   No current facility-administered medications for this encounter.    Physical Findings: The patient is in no acute distress. Patient is alert and oriented.  height is 5' 4"  (1.626 m) and weight is 146 lb 4.8 oz (66.361 kg). Her oral temperature is 97.6 F (36.4 C). Her blood pressure is 95/58 and her pulse is 80. Her respiration is 20. .  The lungs are clear. The  heart has a regular rhythm and rate. Examination of the left proximal arm reveals continued swelling. There is mild hyperpigmentation changes and hypopigmentation changes along the posterior aspect of her left upper arm. No point tenderness appreciated with palpation. Peripheral pulses in the left arm are intact  Lab Findings: Lab Results  Component Value Date   WBC 11.5* 10/08/2014   HGB 11.0* 10/08/2014   HCT 36.8 10/08/2014   MCV 96 10/08/2014   PLT 290 10/08/2014    Radiographic Findings: US Venous Img Upper Uni Left  10/08/2014   CLINICAL DATA:  Left upper arm pain, history of multiple myeloma  EXAM: Left UPPER EXTREMITY VENOUS DOPPLER ULTRASOUND  TECHNIQUE: Gray-scale sonography  with graded compression, as well as color Doppler and duplex ultrasound were performed to evaluate the upper extremity deep venous system from the level of the subclavian vein and including the jugular, axillary, basilic, radial, ulnar and upper cephalic vein. Spectral Doppler was utilized to evaluate flow at rest and with distal augmentation maneuvers.  COMPARISON:  None.  FINDINGS: Contralateral Subclavian Vein: Respiratory phasicity is normal and symmetric with the symptomatic side. No evidence of thrombus. Normal compressibility.  Internal Jugular Vein: No evidence of thrombus. Normal compressibility, respiratory phasicity and response to augmentation.  Subclavian Vein: No evidence of thrombus. Normal compressibility, respiratory phasicity and response to augmentation.  Axillary Vein: No evidence of thrombus. Normal compressibility, respiratory phasicity and response to augmentation.  Cephalic Vein: No evidence of thrombus. Normal compressibility, respiratory phasicity and response to augmentation.  Basilic Vein: No evidence of thrombus. Normal compressibility, respiratory phasicity and response to augmentation.  Brachial Veins: No evidence of thrombus. Normal compressibility, respiratory phasicity and response to augmentation.  Radial Veins: No evidence of thrombus. Normal compressibility, respiratory phasicity and response to augmentation.  Ulnar Veins: No evidence of thrombus. Normal compressibility, respiratory phasicity and response to augmentation.  Venous Reflux:  None visualized.  Other Findings: A rounded 5 mm hypoechoic structure is noted in the antecubital fossa likely representing small lymph node. It demonstrates internal vascular  IMPRESSION: No evidence of deep venous thrombosis.  Likely small lymph node in the antecubital fossa.   Electronically Signed   By: Inez Catalina M.D.   On: 10/08/2014 14:55   Dg Humerus Left  10/08/2014   CLINICAL DATA:  Left humerus pain, history of myeloma  EXAM:  LEFT HUMERUS - 2+ VIEW  COMPARISON:  06/27/2014  FINDINGS: Two views of the left humerus submitted. Again noted lytic lesion in proximal left humerus with slight progression in cortical destruction and cortical reaction medially. There is mild cortical reaction laterally noted proximal left humeral shaft. A metallic fixation intra medullary rod and metallic fixation screws are noted in proximal left humerus. There is no evidence of pathologic fracture. There is a new small lytic lesion in mid shaft of the left humerus. No evidence of failure or loosening of fixation material.  IMPRESSION: Again noted lytic lesion in proximal left humerus with mild progression in medial cortical destruction and cortical reaction medial and laterally. Intra medullary rod and metallic fixation screws are noted in proximal left humerus with anatomic alignment. There is no evidence of pathologic fracture. There is new small lytic lesion in mid shaft of the left humerus.   Electronically Signed   By: Lahoma Crocker M.D.   On: 10/08/2014 15:05    Impression:   IgA lambda myeloma.  The patient is responding to her systemic therapy per discussion with medical oncology. This time the patient denies  any further pain in her left proximal arm. I reviewed the patient's most recent plain x-rays with her plain x-rays immediately prior to her radiation therapy and overall this area seems to be improved. The patient's most recent plain x-ray by diagnostic radiology was compared to her films in November prior to her surgery.  In the interim the cortical destruction had progressed prior to the patient proceeding with her radiation therapy. The patient agrees with no additional radiation therapy at this time but will use if needed in the future. She will continue her systemic therapy.  Plan:  When necessary follow-up in radiation oncology; continued systemic treatment with medical oncology.  ____________________________________ Blair Promise,  MD

## 2014-10-16 ENCOUNTER — Encounter: Payer: Self-pay | Admitting: *Deleted

## 2014-10-16 NOTE — Progress Notes (Signed)
[place] Psychosocial Distress Screening Clinical Social Work  Clinical Social Work was referred by distress screening protocol.  The patient scored a 8 on the Psychosocial Distress Thermometer which indicates severe distress. Clinical Social Worker phoned pt to assess for distress and other psychosocial needs. CSW team has worked extensively with pt on various concerns. CSW left vm for pt today to return call. CSW to continue to follow and assist with her concerns as needed.   ONCBCN DISTRESS SCREENING 10/15/2014  Screening Type Change in Status  Distress experienced in past week (1-10) 8  Practical problem type Insurance  Emotional problem type Boredom  Information Concerns Type Lack of info about complementary therapy choices  Physical Problem type Skin dry/itchy  Physician notified of physical symptoms     Clinical Social Worker follow up needed: Yes.    If yes, follow up plan:  See above Loren Racer, [place] Worker [place]  [facility] Phone: [phone] Fax: [phone]

## 2014-10-21 ENCOUNTER — Encounter: Payer: Self-pay | Admitting: *Deleted

## 2014-10-21 NOTE — Progress Notes (Signed)
[place] Work  Holiday representative met with patient in office at [facility] for scheduled counseling session and to assess and offer support for financial/insurance concerns.  Patient presented with positive mood and stated she was feeling "great".  CSW and patient reviewed previous concerns and issues discussed during last counseling session, and patient reported outcomes of coping techniques and interventions.  CSW provided space and supportive listening as patient discussed her feelings associated with remaining phases of her treatment plan.  CSW and patient also discussed patients financial concerns due to medical bills.  CSW connected patient with the financial advocated and provided information on payment plan programs and the financial hardship application.  Patient plans to follow up with financial resources and will call CSW with questions or concerns.    Johnnye Lana, MSW, LCSW, OSW-C Clinical Social Worker [facility] [phone]

## 2014-10-22 ENCOUNTER — Ambulatory Visit (HOSPITAL_BASED_OUTPATIENT_CLINIC_OR_DEPARTMENT_OTHER): Payer: BC Managed Care – PPO

## 2014-10-22 ENCOUNTER — Ambulatory Visit (HOSPITAL_BASED_OUTPATIENT_CLINIC_OR_DEPARTMENT_OTHER): Payer: BC Managed Care – PPO | Admitting: Lab

## 2014-10-22 ENCOUNTER — Ambulatory Visit (HOSPITAL_BASED_OUTPATIENT_CLINIC_OR_DEPARTMENT_OTHER): Payer: BC Managed Care – PPO | Admitting: Hematology & Oncology

## 2014-10-22 ENCOUNTER — Encounter: Payer: Self-pay | Admitting: Hematology & Oncology

## 2014-10-22 VITALS — BP 110/68 | HR 84 | Temp 97.3°F | Resp 14 | Ht 64.0 in | Wt 145.0 lb

## 2014-10-22 DIAGNOSIS — C9000 Multiple myeloma not having achieved remission: Secondary | ICD-10-CM

## 2014-10-22 DIAGNOSIS — Z5112 Encounter for antineoplastic immunotherapy: Secondary | ICD-10-CM

## 2014-10-22 LAB — CBC WITH DIFFERENTIAL (CANCER CENTER ONLY)
BASO#: 0.1 10*3/uL (ref 0.0–0.2)
BASO%: 0.5 % (ref 0.0–2.0)
EOS%: 2.6 % (ref 0.0–7.0)
Eosinophils Absolute: 0.2 10*3/uL (ref 0.0–0.5)
HCT: 37.6 % (ref 34.8–46.6)
HGB: 11.6 g/dL (ref 11.6–15.9)
LYMPH#: 1 10*3/uL (ref 0.9–3.3)
LYMPH%: 10.8 % — AB (ref 14.0–48.0)
MCH: 29.7 pg (ref 26.0–34.0)
MCHC: 30.9 g/dL — AB (ref 32.0–36.0)
MCV: 96 fL (ref 81–101)
MONO#: 1.4 10*3/uL — AB (ref 0.1–0.9)
MONO%: 14.9 % — ABNORMAL HIGH (ref 0.0–13.0)
NEUT#: 6.6 10*3/uL — ABNORMAL HIGH (ref 1.5–6.5)
NEUT%: 71.2 % (ref 39.6–80.0)
PLATELETS: 330 10*3/uL (ref 145–400)
RBC: 3.9 10*6/uL (ref 3.70–5.32)
RDW: 16.1 % — ABNORMAL HIGH (ref 11.1–15.7)
WBC: 9.3 10*3/uL (ref 3.9–10.0)

## 2014-10-22 LAB — CMP (CANCER CENTER ONLY)
ALK PHOS: 87 U/L — AB (ref 26–84)
ALT(SGPT): 12 U/L (ref 10–47)
AST: 16 U/L (ref 11–38)
Albumin: 3.2 g/dL — ABNORMAL LOW (ref 3.3–5.5)
BILIRUBIN TOTAL: 0.6 mg/dL (ref 0.20–1.60)
BUN: 10 mg/dL (ref 7–22)
CO2: 27 mEq/L (ref 18–33)
Calcium: 9 mg/dL (ref 8.0–10.3)
Chloride: 102 mEq/L (ref 98–108)
Creat: 0.8 mg/dl (ref 0.6–1.2)
GLUCOSE: 93 mg/dL (ref 73–118)
Potassium: 3.6 mEq/L (ref 3.3–4.7)
Sodium: 142 mEq/L (ref 128–145)
Total Protein: 6.7 g/dL (ref 6.4–8.1)

## 2014-10-22 MED ORDER — BORTEZOMIB CHEMO SQ INJECTION 3.5 MG (2.5MG/ML)
1.3000 mg/m2 | Freq: Once | INTRAMUSCULAR | Status: AC
  Administered 2014-10-22: 2.25 mg via SUBCUTANEOUS
  Filled 2014-10-22: qty 2.25

## 2014-10-22 MED ORDER — ONDANSETRON HCL 8 MG PO TABS
8.0000 mg | ORAL_TABLET | Freq: Once | ORAL | Status: AC
  Administered 2014-10-22: 8 mg via ORAL

## 2014-10-22 MED ORDER — SODIUM CHLORIDE 0.9 % IV SOLN
Freq: Once | INTRAVENOUS | Status: AC
  Administered 2014-10-22: 11:00:00 via INTRAVENOUS

## 2014-10-22 MED ORDER — ONDANSETRON HCL 8 MG PO TABS
ORAL_TABLET | ORAL | Status: AC
  Filled 2014-10-22: qty 1

## 2014-10-22 MED ORDER — ZOLEDRONIC ACID 4 MG/100ML IV SOLN
4.0000 mg | Freq: Once | INTRAVENOUS | Status: AC
  Administered 2014-10-22: 4 mg via INTRAVENOUS
  Filled 2014-10-22: qty 100

## 2014-10-22 NOTE — Progress Notes (Signed)
Hematology and Oncology Follow Up Visit  Samantha Moyer [id number] 08/24/1965 49 y.o. 10/22/2014   Principle Diagnosis:   IgA lambda myeloma  Current Therapy:    Radiation therapy for pathologic fracture of the left humerus  Velcade/Revlimid/Decadron-patient - status post cycle 3  Zometa 4 mg IV every month     Interim History:  Ms.  Moyer is in for follow-up. She is doing well. She looks great.Samantha Moyer hair is newly styled. She really looks healthy. She has made an incredible amount of progress since we first saw Samantha Moyer.  Only last saw Samantha Moyer, she is complaining of recurrent pain in the left upper arm. I thought that she might need some more radiation therapy. We got some plain films. However, the pain went away on its own.  Samantha Moyer myeloma levels have improved quite nicely. Back in early femora, Samantha Moyer monoclonal spike was down to 0.89 g/L. This is a marked improvement in Samantha Moyer levels. Samantha Moyer IgA level was down to 632 mg/dL. Samantha Moyer lambda light chain was down to 10.6 mg/dL.  I had a long talk with Samantha Moyer about about stem cell transplant. There are apparently work issues. She cannot go to work right now. She does not want lose Samantha Moyer job. I told Samantha Moyer that we really needed to get Samantha Moyer myeloma levels down as low as possible to make a transplant most effective. She does have some adverse cytogenetics that we really have to watch out for. I told Samantha Moyer that I thought that probably Samantha Moyer stem cell transplant probably would happen sometime in April.  Samantha Moyer appetite is doing well. Samantha Moyer weight is going up. She's E better.  Overall, Samantha Moyer performance status is ECOG 1.  Medications:  Current outpatient prescriptions:  .  aspirin EC 81 MG tablet, Take 81 mg by mouth daily., Disp: , Rfl:  .  dexamethasone (DECADRON) 4 MG tablet, Take 5 pills once a week with food (Patient taking differently: Take 20 mg by mouth once a week. Take 5 pills once a week with food), Disp: 80 tablet, Rfl: 3 .  famciclovir (FAMVIR) 500 MG tablet, Take 1 pill  a day (Patient taking differently: Take 500 mg by mouth daily. ), Disp: 30 tablet, Rfl: 6 .  lenalidomide (REVLIMID) 25 MG capsule, Take 1 pill at nighttime for 21 days on and 7 days off. Auth # [id number], Disp: 21 capsule, Rfl: 0 .  Multiple Vitamins-Minerals (ALIVE WOMENS ENERGY) TABS, Take 1 tablet by mouth daily., Disp: , Rfl:  .  ondansetron (ZOFRAN) 8 MG tablet, Take 1 tablet (8 mg total) by mouth 2 (two) times daily. Start the day after chemo for 2 days. Then take as needed for nausea or vomiting., Disp: 30 tablet, Rfl: 1 .  feeding supplement, RESOURCE BREEZE, (RESOURCE BREEZE) LIQD, Take 1 Container by mouth 2 (two) times daily between meals. (Patient not taking: Reported on 10/22/2014), Disp: 71 Container, Rfl: 0 .  HYDROmorphone (DILAUDID) 2 MG tablet, Take 1 tablet (2 mg total) by mouth every 4 (four) hours as needed for severe pain. (Patient not taking: Reported on 10/15/2014), Disp: 180 tablet, Rfl: 0  Allergies:  Allergies  Allergen Reactions  . Codeine Nausea Only  . Tramadol Nausea And Vomiting and Other (See Comments)    Past Medical History, Surgical history, Social history, and Family History were reviewed and updated.  Review of Systems: As above  Physical Exam:  height is 5\' 4"  (1.626 m) and weight is 145 lb (65.772 kg). Samantha Moyer oral temperature is 97.3 F (36.3 C).  Samantha Moyer blood pressure is 110/68 and Samantha Moyer pulse is 84. Samantha Moyer respiration is 14.   [name] in no obvious distress. Head and neck exam shows no ocular or oral lesions. There are no palpable cervical or supraclavicular lymph nodes. Lungs are clear. Cardiac exam regular rate and rhythm. There are no murmurs, rubs or bruits. Abdomen is soft. She has good bowel sounds. There is no fluid wave. There is no palpable liver or spleen tip. Back exam shows no tenderness over the spine, ribs or hips. Extremities shows decreased range motion of the left arm. There is no edema in the legs. She has  no weakness in the legs. Skin exam shows no rashes, ecchymoses or petechia. Neurological exam is nonfocal.  Lab Results  Component Value Date   WBC 9.3 10/22/2014   HGB 11.6 10/22/2014   HCT 37.6 10/22/2014   MCV 96 10/22/2014   PLT 330 10/22/2014     Chemistry      Component Value Date/Time   NA 142 10/22/2014 0850   NA 137 10/01/2014 1405   K 3.6 10/22/2014 0850   K 3.7 10/01/2014 1405   CL 102 10/22/2014 0850   CL 100 10/01/2014 1405   CO2 27 10/22/2014 0850   CO2 28 10/01/2014 1405   BUN 10 10/22/2014 0850   BUN 8 10/01/2014 1405   CREATININE 0.8 10/22/2014 0850   CREATININE 0.59 10/01/2014 1405      Component Value Date/Time   CALCIUM 9.0 10/22/2014 0850   CALCIUM 9.5 10/01/2014 1405   ALKPHOS 87* 10/22/2014 0850   ALKPHOS 97 10/01/2014 1405   AST 16 10/22/2014 0850   AST 13 10/01/2014 1405   ALT 12 10/22/2014 0850   ALT 10 10/01/2014 1405   BILITOT 0.60 10/22/2014 0850   BILITOT 0.3 10/01/2014 1405         Impression and Plan: Samantha Moyer is 49 year old African female. She has fairly aggressive IgA lambda myeloma. 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  She's responded very nicely to date. Again, I want to get Samantha Moyer myeloma levels as low as possible.  I talked to Samantha Moyer for about 40 minutes. I explained to Samantha Moyer that we have to keep getting Samantha Moyer myeloma as low as possible if we want the best outcome with a stem cell transplant.  I told Samantha Moyer that she will need a stem cell transplant regardless. She has adverse cytogenetics. She is young. Studies still have shown the advantage to stem cell transplantation in young patients with myeloma.  For now, we will continue and proceed with cycle 4 of treatment. I told Samantha Moyer that she may need more than 4 cycles of therapy. She will need another bone marrow test. I would only do this when I felt that she was ready for stem cell transplantation.  We'll plan to get Samantha Moyer back  in another month.   Volanda Napoleon, MD 3/2/20166:21 PM

## 2014-10-22 NOTE — Patient Instructions (Signed)
[facility] Discharge Instructions for Patients Receiving Chemotherapy  Today you received the following chemotherapy agents:  Velcade and zometa  To help prevent nausea and vomiting after your treatment, we encourage you to take your nausea medication.   If you develop nausea and vomiting that is not controlled by your nausea medication, call the clinic.   BELOW ARE SYMPTOMS THAT SHOULD BE REPORTED IMMEDIATELY:  *FEVER GREATER THAN 100.5 F  *CHILLS WITH OR WITHOUT FEVER  NAUSEA AND VOMITING THAT IS NOT CONTROLLED WITH YOUR NAUSEA MEDICATION  *UNUSUAL SHORTNESS OF BREATH  *UNUSUAL BRUISING OR BLEEDING  TENDERNESS IN MOUTH AND THROAT WITH OR WITHOUT PRESENCE OF ULCERS  *URINARY PROBLEMS  *BOWEL PROBLEMS  UNUSUAL RASH Items with * indicate a potential emergency and should be followed up as soon as possible.  Feel free to call the clinic you have any questions or concerns. The clinic phone number is [phone].   Zoledronic Acid injection (Hypercalcemia, Oncology) What is this medicine? ZOLEDRONIC ACID (ZOE le dron ik AS id) lowers the amount of calcium loss from bone. It is used to treat too much calcium in your blood from cancer. It is also used to prevent complications of cancer that has spread to the bone. This medicine may be used for other purposes; ask your health care provider or pharmacist if you have questions. COMMON BRAND NAME(S): Zometa What should I tell my health care provider before I take this medicine? They need to know if you have any of these conditions: -aspirin-sensitive asthma -cancer, especially if you are receiving medicines used to treat cancer -dental disease or wear dentures -infection -kidney disease -receiving corticosteroids like dexamethasone or prednisone -an unusual or allergic reaction to zoledronic acid, other medicines, foods, dyes, or preservatives -pregnant or trying to get pregnant -breast-feeding How should I use  this medicine? This medicine is for infusion into a vein. It is given by a health care professional in a hospital or clinic setting. Talk to your pediatrician regarding the use of this medicine in children. Special care may be needed. Overdosage: If you think you have taken too much of this medicine contact a poison control center or emergency room at once. NOTE: This medicine is only for you. Do not share this medicine with others. What if I miss a dose? It is important not to miss your dose. Call your doctor or health care professional if you are unable to keep an appointment. What may interact with this medicine? -certain antibiotics given by injection -NSAIDs, medicines for pain and inflammation, like ibuprofen or naproxen -some diuretics like bumetanide, furosemide -teriparatide -thalidomide This list may not describe all possible interactions. Give your health care provider a list of all the medicines, herbs, non-prescription drugs, or dietary supplements you use. Also tell them if you smoke, drink alcohol, or use illegal drugs. Some items may interact with your medicine. What should I watch for while using this medicine? Visit your doctor or health care professional for regular checkups. It may be some time before you see the benefit from this medicine. Do not stop taking your medicine unless your doctor tells you to. Your doctor may order blood tests or other tests to see how you are doing. Women should inform their doctor if they wish to become pregnant or think they might be pregnant. There is a potential for serious side effects to an unborn child. Talk to your health care professional or pharmacist for more information. You should make sure that you  get enough calcium and vitamin D while you are taking this medicine. Discuss the foods you eat and the vitamins you take with your health care professional. Some people who take this medicine have severe bone, joint, and/or muscle pain. This  medicine may also increase your risk for jaw problems or a broken thigh bone. Tell your doctor right away if you have severe pain in your jaw, bones, joints, or muscles. Tell your doctor if you have any pain that does not go away or that gets worse. Tell your dentist and dental surgeon that you are taking this medicine. You should not have major dental surgery while on this medicine. See your dentist to have a dental exam and fix any dental problems before starting this medicine. Take good care of your teeth while on this medicine. Make sure you see your dentist for regular follow-up appointments. What side effects may I notice from receiving this medicine? Side effects that you should report to your doctor or health care professional as soon as possible: -allergic reactions like skin rash, itching or hives, swelling of the face, lips, or tongue -anxiety, confusion, or depression -breathing problems -changes in vision -eye pain -feeling faint or lightheaded, falls -jaw pain, especially after dental work -mouth sores -muscle cramps, stiffness, or weakness -trouble passing urine or change in the amount of urine Side effects that usually do not require medical attention (report to your doctor or health care professional if they continue or are bothersome): -bone, joint, or muscle pain -constipation -diarrhea -fever -hair loss -irritation at site where injected -loss of appetite -nausea, vomiting -stomach upset -trouble sleeping -trouble swallowing -weak or tired This list may not describe all possible side effects. Call your doctor for medical advice about side effects. You may report side effects to FDA at 1-800-FDA-1088. Where should I keep my medicine? This drug is given in a hospital or clinic and will not be stored at home. NOTE: This sheet is a summary. It may not cover all possible information. If you have questions about this medicine, talk to your doctor, pharmacist, or health  care provider.  2015, Elsevier/Gold Standard. (2013-01-17 13:03:13) Bortezomib injection What is this medicine? BORTEZOMIB (bor TEZ oh mib) is a chemotherapy drug. It slows the growth of cancer cells. This medicine is used to treat multiple myeloma, and certain lymphomas, such as mantle-cell lymphoma. This medicine may be used for other purposes; ask your health care provider or pharmacist if you have questions. COMMON BRAND NAME(S): Velcade What should I tell my health care provider before I take this medicine? They need to know if you have any of these conditions: -diabetes -heart disease -irregular heartbeat -liver disease -on hemodialysis -low blood counts, like low white blood cells, platelets, or hemoglobin -peripheral neuropathy -taking medicine for blood pressure -an unusual or allergic reaction to bortezomib, mannitol, boron, other medicines, foods, dyes, or preservatives -pregnant or trying to get pregnant -breast-feeding How should I use this medicine? This medicine is for injection into a vein or for injection under the skin. It is given by a health care professional in a hospital or clinic setting. Talk to your pediatrician regarding the use of this medicine in children. Special care may be needed. Overdosage: If you think you have taken too much of this medicine contact a poison control center or emergency room at once. NOTE: This medicine is only for you. Do not share this medicine with others. What if I miss a dose? It is important not to miss  your dose. Call your doctor or health care professional if you are unable to keep an appointment. What may interact with this medicine? This medicine may interact with the following medications: -ketoconazole -rifampin -ritonavir -St. John's Wort This list may not describe all possible interactions. Give your health care provider a list of all the medicines, herbs, non-prescription drugs, or dietary supplements you use. Also  tell them if you smoke, drink alcohol, or use illegal drugs. Some items may interact with your medicine. What should I watch for while using this medicine? Visit your doctor for checks on your progress. This drug may make you feel generally unwell. This is not uncommon, as chemotherapy can affect healthy cells as well as cancer cells. Report any side effects. Continue your course of treatment even though you feel ill unless your doctor tells you to stop. You may get drowsy or dizzy. Do not drive, use machinery, or do anything that needs mental alertness until you know how this medicine affects you. Do not stand or sit up quickly, especially if you are an older patient. This reduces the risk of dizzy or fainting spells. In some cases, you may be given additional medicines to help with side effects. Follow all directions for their use. Call your doctor or health care professional for advice if you get a fever, chills or sore throat, or other symptoms of a cold or flu. Do not treat yourself. This drug decreases your body's ability to fight infections. Try to avoid being around people who are sick. This medicine may increase your risk to bruise or bleed. Call your doctor or health care professional if you notice any unusual bleeding. You may need blood work done while you are taking this medicine. In some patients, this medicine may cause a serious brain infection that may cause death. If you have any problems seeing, thinking, speaking, walking, or standing, tell your doctor right away. If you cannot reach your doctor, urgently seek other source of medical care. Do not become pregnant while taking this medicine. Women should inform their doctor if they wish to become pregnant or think they might be pregnant. There is a potential for serious side effects to an unborn child. Talk to your health care professional or pharmacist for more information. Do not breast-feed an infant while taking this medicine. Check  with your doctor or health care professional if you get an attack of severe diarrhea, nausea and vomiting, or if you sweat a lot. The loss of too much body fluid can make it dangerous for you to take this medicine. What side effects may I notice from receiving this medicine? Side effects that you should report to your doctor or health care professional as soon as possible: -allergic reactions like skin rash, itching or hives, swelling of the face, lips, or tongue -breathing problems -changes in hearing -changes in vision -fast, irregular heartbeat -feeling faint or lightheaded, falls -pain, tingling, numbness in the hands or feet -right upper belly pain -seizures -swelling of the ankles, feet, hands -unusual bleeding or bruising -unusually weak or tired -vomiting -yellowing of the eyes or skin Side effects that usually do not require medical attention (report to your doctor or health care professional if they continue or are bothersome): -changes in emotions or moods -constipation -diarrhea -loss of appetite -headache -irritation at site where injected -nausea This list may not describe all possible side effects. Call your doctor for medical advice about side effects. You may report side effects to FDA at 1-800-FDA-1088.  Where should I keep my medicine? This drug is given in a hospital or clinic and will not be stored at home. NOTE: This sheet is a summary. It may not cover all possible information. If you have questions about this medicine, talk to your doctor, pharmacist, or health care provider.  2015, Elsevier/Gold Standard. (2013-06-03 12:46:32)   Zoledronic Acid injection (Hypercalcemia, Oncology) What is this medicine? ZOLEDRONIC ACID (ZOE le dron ik AS id) lowers the amount of calcium loss from bone. It is used to treat too much calcium in your blood from cancer. It is also used to prevent complications of cancer that has spread to the bone. This medicine may be used for  other purposes; ask your health care provider or pharmacist if you have questions. COMMON BRAND NAME(S): Zometa What should I tell my health care provider before I take this medicine? They need to know if you have any of these conditions: -aspirin-sensitive asthma -cancer, especially if you are receiving medicines used to treat cancer -dental disease or wear dentures -infection -kidney disease -receiving corticosteroids like dexamethasone or prednisone -an unusual or allergic reaction to zoledronic acid, other medicines, foods, dyes, or preservatives -pregnant or trying to get pregnant -breast-feeding How should I use this medicine? This medicine is for infusion into a vein. It is given by a health care professional in a hospital or clinic setting. Talk to your pediatrician regarding the use of this medicine in children. Special care may be needed. Overdosage: If you think you have taken too much of this medicine contact a poison control center or emergency room at once. NOTE: This medicine is only for you. Do not share this medicine with others. What if I miss a dose? It is important not to miss your dose. Call your doctor or health care professional if you are unable to keep an appointment. What may interact with this medicine? -certain antibiotics given by injection -NSAIDs, medicines for pain and inflammation, like ibuprofen or naproxen -some diuretics like bumetanide, furosemide -teriparatide -thalidomide This list may not describe all possible interactions. Give your health care provider a list of all the medicines, herbs, non-prescription drugs, or dietary supplements you use. Also tell them if you smoke, drink alcohol, or use illegal drugs. Some items may interact with your medicine. What should I watch for while using this medicine? Visit your doctor or health care professional for regular checkups. It may be some time before you see the benefit from this medicine. Do not stop  taking your medicine unless your doctor tells you to. Your doctor may order blood tests or other tests to see how you are doing. Women should inform their doctor if they wish to become pregnant or think they might be pregnant. There is a potential for serious side effects to an unborn child. Talk to your health care professional or pharmacist for more information. You should make sure that you get enough calcium and vitamin D while you are taking this medicine. Discuss the foods you eat and the vitamins you take with your health care professional. Some people who take this medicine have severe bone, joint, and/or muscle pain. This medicine may also increase your risk for jaw problems or a broken thigh bone. Tell your doctor right away if you have severe pain in your jaw, bones, joints, or muscles. Tell your doctor if you have any pain that does not go away or that gets worse. Tell your dentist and dental surgeon that you are taking this medicine. You  should not have major dental surgery while on this medicine. See your dentist to have a dental exam and fix any dental problems before starting this medicine. Take good care of your teeth while on this medicine. Make sure you see your dentist for regular follow-up appointments. What side effects may I notice from receiving this medicine? Side effects that you should report to your doctor or health care professional as soon as possible: -allergic reactions like skin rash, itching or hives, swelling of the face, lips, or tongue -anxiety, confusion, or depression -breathing problems -changes in vision -eye pain -feeling faint or lightheaded, falls -jaw pain, especially after dental work -mouth sores -muscle cramps, stiffness, or weakness -trouble passing urine or change in the amount of urine Side effects that usually do not require medical attention (report to your doctor or health care professional if they continue or are bothersome): -bone, joint, or  muscle pain -constipation -diarrhea -fever -hair loss -irritation at site where injected -loss of appetite -nausea, vomiting -stomach upset -trouble sleeping -trouble swallowing -weak or tired This list may not describe all possible side effects. Call your doctor for medical advice about side effects. You may report side effects to FDA at 1-800-FDA-1088. Where should I keep my medicine? This drug is given in a hospital or clinic and will not be stored at home. NOTE: This sheet is a summary. It may not cover all possible information. If you have questions about this medicine, talk to your doctor, pharmacist, or health care provider.  2015, Elsevier/Gold Standard. (2013-01-17 13:03:13)

## 2014-10-24 LAB — PROTEIN ELECTROPHORESIS, SERUM, WITH REFLEX
ALBUMIN ELP: 54.8 % — AB (ref 55.8–66.1)
ALPHA-1-GLOBULIN: 4.7 % (ref 2.9–4.9)
ALPHA-2-GLOBULIN: 12.4 % — AB (ref 7.1–11.8)
BETA GLOBULIN: 9.5 % — AB (ref 4.7–7.2)
Beta 2: 4.3 % (ref 3.2–6.5)
Gamma Globulin: 14.3 % (ref 11.1–18.8)
M-Spike, %: 0.11 g/dL
Total Protein, Serum Electrophoresis: 6.2 g/dL (ref 6.0–8.3)

## 2014-10-24 LAB — LACTATE DEHYDROGENASE: LDH: 137 U/L (ref 94–250)

## 2014-10-24 LAB — KAPPA/LAMBDA LIGHT CHAINS
KAPPA FREE LGHT CHN: 1.48 mg/dL (ref 0.33–1.94)
Kappa:Lambda Ratio: 0.25 — ABNORMAL LOW (ref 0.26–1.65)
LAMBDA FREE LGHT CHN: 6.04 mg/dL — AB (ref 0.57–2.63)

## 2014-10-24 LAB — IGG, IGA, IGM
IGA: 296 mg/dL (ref 69–380)
IGG (IMMUNOGLOBIN G), SERUM: 1110 mg/dL (ref 690–1700)
IgM, Serum: 41 mg/dL — ABNORMAL LOW (ref 52–322)

## 2014-10-24 LAB — IFE INTERPRETATION

## 2014-10-24 LAB — BETA 2 MICROGLOBULIN, SERUM: Beta-2 Microglobulin: 1.89 mg/L (ref <=2.51)

## 2014-10-29 ENCOUNTER — Ambulatory Visit (HOSPITAL_BASED_OUTPATIENT_CLINIC_OR_DEPARTMENT_OTHER): Payer: BC Managed Care – PPO

## 2014-10-29 ENCOUNTER — Other Ambulatory Visit (HOSPITAL_BASED_OUTPATIENT_CLINIC_OR_DEPARTMENT_OTHER): Payer: BC Managed Care – PPO | Admitting: Lab

## 2014-10-29 DIAGNOSIS — C9000 Multiple myeloma not having achieved remission: Secondary | ICD-10-CM

## 2014-10-29 DIAGNOSIS — Z5112 Encounter for antineoplastic immunotherapy: Secondary | ICD-10-CM

## 2014-10-29 LAB — CBC WITH DIFFERENTIAL (CANCER CENTER ONLY)
BASO#: 0 10*3/uL (ref 0.0–0.2)
BASO%: 0.2 % (ref 0.0–2.0)
EOS ABS: 0.3 10*3/uL (ref 0.0–0.5)
EOS%: 3.3 % (ref 0.0–7.0)
HCT: 38.2 % (ref 34.8–46.6)
HGB: 11.6 g/dL (ref 11.6–15.9)
LYMPH#: 1.3 10*3/uL (ref 0.9–3.3)
LYMPH%: 13.7 % — ABNORMAL LOW (ref 14.0–48.0)
MCH: 29.5 pg (ref 26.0–34.0)
MCHC: 30.4 g/dL — AB (ref 32.0–36.0)
MCV: 97 fL (ref 81–101)
MONO#: 0.7 10*3/uL (ref 0.1–0.9)
MONO%: 7.4 % (ref 0.0–13.0)
NEUT#: 6.9 10*3/uL — ABNORMAL HIGH (ref 1.5–6.5)
NEUT%: 75.4 % (ref 39.6–80.0)
Platelets: 340 10*3/uL (ref 145–400)
RBC: 3.93 10*6/uL (ref 3.70–5.32)
RDW: 16 % — ABNORMAL HIGH (ref 11.1–15.7)
WBC: 9.1 10*3/uL (ref 3.9–10.0)

## 2014-10-29 LAB — CMP (CANCER CENTER ONLY)
ALK PHOS: 89 U/L — AB (ref 26–84)
ALT(SGPT): 16 U/L (ref 10–47)
AST: 18 U/L (ref 11–38)
Albumin: 3.3 g/dL (ref 3.3–5.5)
BILIRUBIN TOTAL: 0.5 mg/dL (ref 0.20–1.60)
BUN: 9 mg/dL (ref 7–22)
CHLORIDE: 102 meq/L (ref 98–108)
CO2: 30 meq/L (ref 18–33)
CREATININE: 0.5 mg/dL — AB (ref 0.6–1.2)
Calcium: 8.7 mg/dL (ref 8.0–10.3)
GLUCOSE: 85 mg/dL (ref 73–118)
Potassium: 3.7 mEq/L (ref 3.3–4.7)
Sodium: 140 mEq/L (ref 128–145)
Total Protein: 6.9 g/dL (ref 6.4–8.1)

## 2014-10-29 MED ORDER — ONDANSETRON HCL 8 MG PO TABS
ORAL_TABLET | ORAL | Status: AC
  Filled 2014-10-29: qty 1

## 2014-10-29 MED ORDER — BORTEZOMIB CHEMO SQ INJECTION 3.5 MG (2.5MG/ML)
1.3000 mg/m2 | Freq: Once | INTRAMUSCULAR | Status: AC
  Administered 2014-10-29: 2.25 mg via SUBCUTANEOUS
  Filled 2014-10-29: qty 2.25

## 2014-10-29 MED ORDER — ONDANSETRON HCL 8 MG PO TABS
8.0000 mg | ORAL_TABLET | Freq: Once | ORAL | Status: AC
  Administered 2014-10-29: 8 mg via ORAL

## 2014-10-29 NOTE — Patient Instructions (Signed)
Bortezomib injection What is this medicine? BORTEZOMIB (bor TEZ oh mib) is a chemotherapy drug. It slows the growth of cancer cells. This medicine is used to treat multiple myeloma, and certain lymphomas, such as mantle-cell lymphoma. This medicine may be used for other purposes; ask your health care provider or pharmacist if you have questions. COMMON BRAND NAME(S): Velcade What should I tell my health care provider before I take this medicine? They need to know if you have any of these conditions: -diabetes -heart disease -irregular heartbeat -liver disease -on hemodialysis -low blood counts, like low white blood cells, platelets, or hemoglobin -peripheral neuropathy -taking medicine for blood pressure -an unusual or allergic reaction to bortezomib, mannitol, boron, other medicines, foods, dyes, or preservatives -pregnant or trying to get pregnant -breast-feeding How should I use this medicine? This medicine is for injection into a vein or for injection under the skin. It is given by a health care professional in a hospital or clinic setting. Talk to your pediatrician regarding the use of this medicine in children. Special care may be needed. Overdosage: If you think you have taken too much of this medicine contact a poison control center or emergency room at once. NOTE: This medicine is only for you. Do not share this medicine with others. What if I miss a dose? It is important not to miss your dose. Call your doctor or health care professional if you are unable to keep an appointment. What may interact with this medicine? This medicine may interact with the following medications: -ketoconazole -rifampin -ritonavir -St. John's Wort This list may not describe all possible interactions. Give your health care provider a list of all the medicines, herbs, non-prescription drugs, or dietary supplements you use. Also tell them if you smoke, drink alcohol, or use illegal drugs. Some items  may interact with your medicine. What should I watch for while using this medicine? Visit your doctor for checks on your progress. This drug may make you feel generally unwell. This is not uncommon, as chemotherapy can affect healthy cells as well as cancer cells. Report any side effects. Continue your course of treatment even though you feel ill unless your doctor tells you to stop. You may get drowsy or dizzy. Do not drive, use machinery, or do anything that needs mental alertness until you know how this medicine affects you. Do not stand or sit up quickly, especially if you are an older patient. This reduces the risk of dizzy or fainting spells. In some cases, you may be given additional medicines to help with side effects. Follow all directions for their use. Call your doctor or health care professional for advice if you get a fever, chills or sore throat, or other symptoms of a cold or flu. Do not treat yourself. This drug decreases your body's ability to fight infections. Try to avoid being around people who are sick. This medicine may increase your risk to bruise or bleed. Call your doctor or health care professional if you notice any unusual bleeding. You may need blood work done while you are taking this medicine. In some patients, this medicine may cause a serious brain infection that may cause death. If you have any problems seeing, thinking, speaking, walking, or standing, tell your doctor right away. If you cannot reach your doctor, urgently seek other source of medical care. Do not become pregnant while taking this medicine. Women should inform their doctor if they wish to become pregnant or think they might be pregnant. There is   a potential for serious side effects to an unborn child. Talk to your health care professional or pharmacist for more information. Do not breast-feed an infant while taking this medicine. Check with your doctor or health care professional if you get an attack of  severe diarrhea, nausea and vomiting, or if you sweat a lot. The loss of too much body fluid can make it dangerous for you to take this medicine. What side effects may I notice from receiving this medicine? Side effects that you should report to your doctor or health care professional as soon as possible: -allergic reactions like skin rash, itching or hives, swelling of the face, lips, or tongue -breathing problems -changes in hearing -changes in vision -fast, irregular heartbeat -feeling faint or lightheaded, falls -pain, tingling, numbness in the hands or feet -right upper belly pain -seizures -swelling of the ankles, feet, hands -unusual bleeding or bruising -unusually weak or tired -vomiting -yellowing of the eyes or skin Side effects that usually do not require medical attention (report to your doctor or health care professional if they continue or are bothersome): -changes in emotions or moods -constipation -diarrhea -loss of appetite -headache -irritation at site where injected -nausea This list may not describe all possible side effects. Call your doctor for medical advice about side effects. You may report side effects to FDA at 1-800-FDA-1088. Where should I keep my medicine? This drug is given in a hospital or clinic and will not be stored at home. NOTE: This sheet is a summary. It may not cover all possible information. If you have questions about this medicine, talk to your doctor, pharmacist, or health care provider.  2015, Elsevier/Gold Standard. (2013-06-03 12:46:32)  

## 2014-11-05 ENCOUNTER — Ambulatory Visit (HOSPITAL_BASED_OUTPATIENT_CLINIC_OR_DEPARTMENT_OTHER): Payer: BC Managed Care – PPO

## 2014-11-05 ENCOUNTER — Other Ambulatory Visit (HOSPITAL_BASED_OUTPATIENT_CLINIC_OR_DEPARTMENT_OTHER): Payer: BC Managed Care – PPO | Admitting: Lab

## 2014-11-05 DIAGNOSIS — C9000 Multiple myeloma not having achieved remission: Secondary | ICD-10-CM

## 2014-11-05 DIAGNOSIS — Z5112 Encounter for antineoplastic immunotherapy: Secondary | ICD-10-CM

## 2014-11-05 LAB — CBC WITH DIFFERENTIAL (CANCER CENTER ONLY)
BASO#: 0 10*3/uL (ref 0.0–0.2)
BASO%: 0.3 % (ref 0.0–2.0)
EOS%: 3.6 % (ref 0.0–7.0)
Eosinophils Absolute: 0.4 10*3/uL (ref 0.0–0.5)
HCT: 38.4 % (ref 34.8–46.6)
HEMOGLOBIN: 11.8 g/dL (ref 11.6–15.9)
LYMPH#: 1.2 10*3/uL (ref 0.9–3.3)
LYMPH%: 10.7 % — ABNORMAL LOW (ref 14.0–48.0)
MCH: 29.6 pg (ref 26.0–34.0)
MCHC: 30.7 g/dL — ABNORMAL LOW (ref 32.0–36.0)
MCV: 96 fL (ref 81–101)
MONO#: 2 10*3/uL — ABNORMAL HIGH (ref 0.1–0.9)
MONO%: 17.7 % — AB (ref 0.0–13.0)
NEUT%: 67.7 % (ref 39.6–80.0)
NEUTROS ABS: 7.6 10*3/uL — AB (ref 1.5–6.5)
Platelets: 281 10*3/uL (ref 145–400)
RBC: 3.99 10*6/uL (ref 3.70–5.32)
RDW: 15.5 % (ref 11.1–15.7)
WBC: 11.3 10*3/uL — ABNORMAL HIGH (ref 3.9–10.0)

## 2014-11-05 LAB — CMP (CANCER CENTER ONLY)
ALBUMIN: 3.3 g/dL (ref 3.3–5.5)
ALT: 14 U/L (ref 10–47)
AST: 19 U/L (ref 11–38)
Alkaline Phosphatase: 85 U/L — ABNORMAL HIGH (ref 26–84)
BUN, Bld: 10 mg/dL (ref 7–22)
CALCIUM: 9.5 mg/dL (ref 8.0–10.3)
CHLORIDE: 101 meq/L (ref 98–108)
CO2: 30 mEq/L (ref 18–33)
CREATININE: 0.8 mg/dL (ref 0.6–1.2)
Glucose, Bld: 90 mg/dL (ref 73–118)
POTASSIUM: 3.7 meq/L (ref 3.3–4.7)
SODIUM: 141 meq/L (ref 128–145)
TOTAL PROTEIN: 6.7 g/dL (ref 6.4–8.1)
Total Bilirubin: 0.5 mg/dl (ref 0.20–1.60)

## 2014-11-05 MED ORDER — ONDANSETRON HCL 8 MG PO TABS
ORAL_TABLET | ORAL | Status: AC
  Filled 2014-11-05: qty 1

## 2014-11-05 MED ORDER — ONDANSETRON HCL 8 MG PO TABS
8.0000 mg | ORAL_TABLET | Freq: Once | ORAL | Status: AC
  Administered 2014-11-05: 8 mg via ORAL

## 2014-11-05 MED ORDER — BORTEZOMIB CHEMO SQ INJECTION 3.5 MG (2.5MG/ML)
1.3000 mg/m2 | Freq: Once | INTRAMUSCULAR | Status: AC
  Administered 2014-11-05: 2.25 mg via SUBCUTANEOUS
  Filled 2014-11-05: qty 2.25

## 2014-11-05 NOTE — Patient Instructions (Signed)
Bortezomib injection What is this medicine? BORTEZOMIB (bor TEZ oh mib) is a chemotherapy drug. It slows the growth of cancer cells. This medicine is used to treat multiple myeloma, and certain lymphomas, such as mantle-cell lymphoma. This medicine may be used for other purposes; ask your health care provider or pharmacist if you have questions. COMMON BRAND NAME(S): Velcade What should I tell my health care provider before I take this medicine? They need to know if you have any of these conditions: -diabetes -heart disease -irregular heartbeat -liver disease -on hemodialysis -low blood counts, like low white blood cells, platelets, or hemoglobin -peripheral neuropathy -taking medicine for blood pressure -an unusual or allergic reaction to bortezomib, mannitol, boron, other medicines, foods, dyes, or preservatives -pregnant or trying to get pregnant -breast-feeding How should I use this medicine? This medicine is for injection into a vein or for injection under the skin. It is given by a health care professional in a hospital or clinic setting. Talk to your pediatrician regarding the use of this medicine in children. Special care may be needed. Overdosage: If you think you have taken too much of this medicine contact a poison control center or emergency room at once. NOTE: This medicine is only for you. Do not share this medicine with others. What if I miss a dose? It is important not to miss your dose. Call your doctor or health care professional if you are unable to keep an appointment. What may interact with this medicine? This medicine may interact with the following medications: -ketoconazole -rifampin -ritonavir -St. John's Wort This list may not describe all possible interactions. Give your health care provider a list of all the medicines, herbs, non-prescription drugs, or dietary supplements you use. Also tell them if you smoke, drink alcohol, or use illegal drugs. Some items  may interact with your medicine. What should I watch for while using this medicine? Visit your doctor for checks on your progress. This drug may make you feel generally unwell. This is not uncommon, as chemotherapy can affect healthy cells as well as cancer cells. Report any side effects. Continue your course of treatment even though you feel ill unless your doctor tells you to stop. You may get drowsy or dizzy. Do not drive, use machinery, or do anything that needs mental alertness until you know how this medicine affects you. Do not stand or sit up quickly, especially if you are an older patient. This reduces the risk of dizzy or fainting spells. In some cases, you may be given additional medicines to help with side effects. Follow all directions for their use. Call your doctor or health care professional for advice if you get a fever, chills or sore throat, or other symptoms of a cold or flu. Do not treat yourself. This drug decreases your body's ability to fight infections. Try to avoid being around people who are sick. This medicine may increase your risk to bruise or bleed. Call your doctor or health care professional if you notice any unusual bleeding. You may need blood work done while you are taking this medicine. In some patients, this medicine may cause a serious brain infection that may cause death. If you have any problems seeing, thinking, speaking, walking, or standing, tell your doctor right away. If you cannot reach your doctor, urgently seek other source of medical care. Do not become pregnant while taking this medicine. Women should inform their doctor if they wish to become pregnant or think they might be pregnant. There is   a potential for serious side effects to an unborn child. Talk to your health care professional or pharmacist for more information. Do not breast-feed an infant while taking this medicine. Check with your doctor or health care professional if you get an attack of  severe diarrhea, nausea and vomiting, or if you sweat a lot. The loss of too much body fluid can make it dangerous for you to take this medicine. What side effects may I notice from receiving this medicine? Side effects that you should report to your doctor or health care professional as soon as possible: -allergic reactions like skin rash, itching or hives, swelling of the face, lips, or tongue -breathing problems -changes in hearing -changes in vision -fast, irregular heartbeat -feeling faint or lightheaded, falls -pain, tingling, numbness in the hands or feet -right upper belly pain -seizures -swelling of the ankles, feet, hands -unusual bleeding or bruising -unusually weak or tired -vomiting -yellowing of the eyes or skin Side effects that usually do not require medical attention (report to your doctor or health care professional if they continue or are bothersome): -changes in emotions or moods -constipation -diarrhea -loss of appetite -headache -irritation at site where injected -nausea This list may not describe all possible side effects. Call your doctor for medical advice about side effects. You may report side effects to FDA at 1-800-FDA-1088. Where should I keep my medicine? This drug is given in a hospital or clinic and will not be stored at home. NOTE: This sheet is a summary. It may not cover all possible information. If you have questions about this medicine, talk to your doctor, pharmacist, or health care provider.  2015, Elsevier/Gold Standard. (2013-06-03 12:46:32)  

## 2014-11-06 ENCOUNTER — Telehealth: Payer: Self-pay | Admitting: Hematology & Oncology

## 2014-11-06 NOTE — Telephone Encounter (Signed)
Dr. Johnette Abraham did a peer to peer and it was DENIED.  [id number]  Dos: 09/03/2014  INJECTION, IMMUNE GLOBULIN, (OCTAGAM), INTRAVENOUS, NONLYOPHILIZED (E.G.,    DENIED: [id number]  Reason: An addl medical director rvw determined that the request does not meet the definition of med necessity found in the member's benefit booklet for IgA lambda myeloma.

## 2014-11-11 ENCOUNTER — Other Ambulatory Visit: Payer: Self-pay | Admitting: *Deleted

## 2014-11-11 DIAGNOSIS — C9000 Multiple myeloma not having achieved remission: Secondary | ICD-10-CM

## 2014-11-11 MED ORDER — LENALIDOMIDE 25 MG PO CAPS: ORAL_CAPSULE | ORAL | Status: DC

## 2014-11-19 ENCOUNTER — Ambulatory Visit (HOSPITAL_BASED_OUTPATIENT_CLINIC_OR_DEPARTMENT_OTHER): Payer: BC Managed Care – PPO

## 2014-11-19 ENCOUNTER — Ambulatory Visit (HOSPITAL_BASED_OUTPATIENT_CLINIC_OR_DEPARTMENT_OTHER): Payer: BC Managed Care – PPO | Admitting: Hematology & Oncology

## 2014-11-19 ENCOUNTER — Encounter: Payer: Self-pay | Admitting: Hematology & Oncology

## 2014-11-19 VITALS — BP 111/78 | HR 95 | Temp 97.5°F | Resp 14 | Ht 64.0 in | Wt 146.0 lb

## 2014-11-19 DIAGNOSIS — C9000 Multiple myeloma not having achieved remission: Secondary | ICD-10-CM

## 2014-11-19 DIAGNOSIS — Z5112 Encounter for antineoplastic immunotherapy: Secondary | ICD-10-CM

## 2014-11-19 LAB — CBC WITH DIFFERENTIAL (CANCER CENTER ONLY)
BASO#: 0.1 10*3/uL (ref 0.0–0.2)
BASO%: 0.5 % (ref 0.0–2.0)
EOS%: 2 % (ref 0.0–7.0)
Eosinophils Absolute: 0.2 10*3/uL (ref 0.0–0.5)
HEMATOCRIT: 41.6 % (ref 34.8–46.6)
HGB: 12.9 g/dL (ref 11.6–15.9)
LYMPH#: 0.9 10*3/uL (ref 0.9–3.3)
LYMPH%: 9.5 % — AB (ref 14.0–48.0)
MCH: 29.7 pg (ref 26.0–34.0)
MCHC: 31 g/dL — ABNORMAL LOW (ref 32.0–36.0)
MCV: 96 fL (ref 81–101)
MONO#: 1.5 10*3/uL — ABNORMAL HIGH (ref 0.1–0.9)
MONO%: 16.6 % — AB (ref 0.0–13.0)
NEUT#: 6.5 10*3/uL (ref 1.5–6.5)
NEUT%: 71.4 % (ref 39.6–80.0)
PLATELETS: 495 10*3/uL — AB (ref 145–400)
RBC: 4.34 10*6/uL (ref 3.70–5.32)
RDW: 16.3 % — ABNORMAL HIGH (ref 11.1–15.7)
WBC: 9.2 10*3/uL (ref 3.9–10.0)

## 2014-11-19 LAB — CMP (CANCER CENTER ONLY)
ALT(SGPT): 18 U/L (ref 10–47)
AST: 17 U/L (ref 11–38)
Albumin: 3.4 g/dL (ref 3.3–5.5)
Alkaline Phosphatase: 84 U/L (ref 26–84)
BILIRUBIN TOTAL: 0.6 mg/dL (ref 0.20–1.60)
BUN: 9 mg/dL (ref 7–22)
CALCIUM: 9.6 mg/dL (ref 8.0–10.3)
CO2: 30 mEq/L (ref 18–33)
Chloride: 100 mEq/L (ref 98–108)
Creat: 0.6 mg/dl (ref 0.6–1.2)
Glucose, Bld: 74 mg/dL (ref 73–118)
POTASSIUM: 3.7 meq/L (ref 3.3–4.7)
Sodium: 141 mEq/L (ref 128–145)
TOTAL PROTEIN: 7.3 g/dL (ref 6.4–8.1)

## 2014-11-19 LAB — LACTATE DEHYDROGENASE: LDH: 163 U/L (ref 94–250)

## 2014-11-19 MED ORDER — BORTEZOMIB CHEMO SQ INJECTION 3.5 MG (2.5MG/ML)
1.3000 mg/m2 | Freq: Once | INTRAMUSCULAR | Status: AC
  Administered 2014-11-19: 2.25 mg via SUBCUTANEOUS
  Filled 2014-11-19: qty 2.25

## 2014-11-19 MED ORDER — ZOLEDRONIC ACID 4 MG/100ML IV SOLN
4.0000 mg | Freq: Once | INTRAVENOUS | Status: AC
  Administered 2014-11-19: 4 mg via INTRAVENOUS
  Filled 2014-11-19: qty 100

## 2014-11-19 MED ORDER — ONDANSETRON HCL 8 MG PO TABS
ORAL_TABLET | ORAL | Status: AC
  Filled 2014-11-19: qty 1

## 2014-11-19 MED ORDER — ONDANSETRON HCL 8 MG PO TABS
8.0000 mg | ORAL_TABLET | Freq: Once | ORAL | Status: AC
  Administered 2014-11-19: 8 mg via ORAL

## 2014-11-19 NOTE — Progress Notes (Signed)
Hematology and Oncology Follow Up Visit  Samantha Moyer [id number] 06-09-1966 49 y.o. 11/19/2014   Principle Diagnosis:   IgA lambda myeloma  Current Therapy:    Radiation therapy for pathologic fracture of the left humerus  Velcade/Revlimid/Decadron-patient - status post cycle 4  Zometa 4 mg IV every month     Interim History:  Ms.  Moyer is in for follow-up. She is doing well. She had a great Easter. She ate quite a bit.  She feels well. She is responding currently well to treatment. Her monoclonal spike is now down to 0.11 g/dL. She only has one monoclonal spike now. We first saw her, she had 4 spikes.  Her IgA level is 2 96 mg/dL. Her lambda light chain is now down to 6 mg/dL.  She is not complaining of pain on the left arm. She's has a low bit better range of motion. She has had radiation and surgery to this area to the upper humerus.  There has not had any change in bowel or bladder habits. His been no diarrhea. She's had no constipation. She may have a bout of diarrhea after each Velcade. She has occasional tingling but this only lasted one day.  She has really responded well. We first saw her, she was basically in a wheelchair. She was losing weight. She looks so much better now.  Overall, her performance status is ECOG 1.  Medications:  Current outpatient prescriptions:  .  aspirin EC 81 MG tablet, Take 81 mg by mouth daily., Disp: , Rfl:  .  dexamethasone (DECADRON) 4 MG tablet, Take 5 pills once a week with food (Patient taking differently: Take 20 mg by mouth once a week. Take 5 pills once a week with food), Disp: 80 tablet, Rfl: 3 .  famciclovir (FAMVIR) 500 MG tablet, Take 1 pill a day (Patient taking differently: Take 500 mg by mouth daily. ), Disp: 30 tablet, Rfl: 6 .  lenalidomide (REVLIMID) 25 MG capsule, Take 1 pill at nighttime for 21 days on and 7 days off. Auth # [id number], Disp: 21 capsule, Rfl: 0 .  Multiple Vitamins-Minerals (ALIVE WOMENS ENERGY) TABS,  Take 1 tablet by mouth daily., Disp: , Rfl:  .  ondansetron (ZOFRAN) 8 MG tablet, Take 1 tablet (8 mg total) by mouth 2 (two) times daily. Start the day after chemo for 2 days. Then take as needed for nausea or vomiting., Disp: 30 tablet, Rfl: 1  Allergies:  Allergies  Allergen Reactions  . Codeine Nausea Only  . Tramadol Nausea And Vomiting and Other (See Comments)    Past Medical History, Surgical history, Social history, and Family History were reviewed and updated.  Review of Systems: As above  Physical Exam:  height is 5' 4"  (1.626 m) and weight is 146 lb (66.225 kg). Her oral temperature is 97.5 F (36.4 C). Her blood pressure is 111/78 and her pulse is 95. Her respiration is 14.   [name] in no obvious distress. Head and neck exam shows no ocular or oral lesions. There are no palpable cervical or supraclavicular lymph nodes. Lungs are clear. Cardiac exam regular rate and rhythm. There are no murmurs, rubs or bruits. Abdomen is soft. She has good bowel sounds. There is no fluid wave. There is no palpable liver or spleen tip. Back exam shows no tenderness over the spine, ribs or hips. Extremities shows decreased range motion of the left arm. There is no edema in the legs. She has no weakness  in the legs. Skin exam shows no rashes, ecchymoses or petechia. Neurological exam is nonfocal.  Lab Results  Component Value Date   WBC 9.2 11/19/2014   HGB 12.9 11/19/2014   HCT 41.6 11/19/2014   MCV 96 11/19/2014   PLT 495* 11/19/2014     Chemistry      Component Value Date/Time   NA 141 11/19/2014 0949   NA 137 10/01/2014 1405   K 3.7 11/19/2014 0949   K 3.7 10/01/2014 1405   CL 100 11/19/2014 0949   CL 100 10/01/2014 1405   CO2 30 11/19/2014 0949   CO2 28 10/01/2014 1405   BUN 9 11/19/2014 0949   BUN 8 10/01/2014 1405   CREATININE 0.6 11/19/2014 0949   CREATININE 0.59 10/01/2014 1405      Component Value Date/Time   CALCIUM 9.6  11/19/2014 0949   CALCIUM 9.5 10/01/2014 1405   ALKPHOS 84 11/19/2014 0949   ALKPHOS 97 10/01/2014 1405   AST 17 11/19/2014 0949   AST 13 10/01/2014 1405   ALT 18 11/19/2014 0949   ALT 10 10/01/2014 1405   BILITOT 0.60 11/19/2014 0949   BILITOT 0.3 10/01/2014 1405         Impression and Plan: Samantha Moyer is 49 year old African American female. She has fairly aggressive IgA lambda myeloma. 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  She's responded very nicely to date. Again, I want to get her myeloma levels as low as possible.  I talked to her for about 40 minutes. I explained to her that we have to keep getting her myeloma as low as possible if we want the best outcome with a stem cell transplant.  I told her that she will need a stem cell transplant regardless. She has adverse cytogenetics. She is young. Studies still have shown the advantage to stem cell transplantation in young patients with myeloma.  For now, we will continue and proceed with cycle 5 of treatment. I will then proceed with a bone marrow biopsy. We'll set this up for April 19. I think that with the response that we've seen so far, the bone marrow biopsy should show [place] a very good result.  I probably will then consider getting her out to [facility] for transplant consideration afterwards.  We'll plan to get her back in another month.   Volanda Napoleon, MD 3/30/20165:26 PM

## 2014-11-19 NOTE — Patient Instructions (Signed)
Bortezomib injection What is this medicine? BORTEZOMIB (bor TEZ oh mib) is a chemotherapy drug. It slows the growth of cancer cells. This medicine is used to treat multiple myeloma, and certain lymphomas, such as mantle-cell lymphoma. This medicine may be used for other purposes; ask your health care provider or pharmacist if you have questions. COMMON BRAND NAME(S): Velcade What should I tell my health care provider before I take this medicine? They need to know if you have any of these conditions: -diabetes -heart disease -irregular heartbeat -liver disease -on hemodialysis -low blood counts, like low white blood cells, platelets, or hemoglobin -peripheral neuropathy -taking medicine for blood pressure -an unusual or allergic reaction to bortezomib, mannitol, boron, other medicines, foods, dyes, or preservatives -pregnant or trying to get pregnant -breast-feeding How should I use this medicine? This medicine is for injection into a vein or for injection under the skin. It is given by a health care professional in a hospital or clinic setting. Talk to your pediatrician regarding the use of this medicine in children. Special care may be needed. Overdosage: If you think you have taken too much of this medicine contact a poison control center or emergency room at once. NOTE: This medicine is only for you. Do not share this medicine with others. What if I miss a dose? It is important not to miss your dose. Call your doctor or health care professional if you are unable to keep an appointment. What may interact with this medicine? This medicine may interact with the following medications: -ketoconazole -rifampin -ritonavir -St. John's Wort This list may not describe all possible interactions. Give your health care provider a list of all the medicines, herbs, non-prescription drugs, or dietary supplements you use. Also tell them if you smoke, drink alcohol, or use illegal drugs. Some items  may interact with your medicine. What should I watch for while using this medicine? Visit your doctor for checks on your progress. This drug may make you feel generally unwell. This is not uncommon, as chemotherapy can affect healthy cells as well as cancer cells. Report any side effects. Continue your course of treatment even though you feel ill unless your doctor tells you to stop. You may get drowsy or dizzy. Do not drive, use machinery, or do anything that needs mental alertness until you know how this medicine affects you. Do not stand or sit up quickly, especially if you are an older patient. This reduces the risk of dizzy or fainting spells. In some cases, you may be given additional medicines to help with side effects. Follow all directions for their use. Call your doctor or health care professional for advice if you get a fever, chills or sore throat, or other symptoms of a cold or flu. Do not treat yourself. This drug decreases your body's ability to fight infections. Try to avoid being around people who are sick. This medicine may increase your risk to bruise or bleed. Call your doctor or health care professional if you notice any unusual bleeding. You may need blood work done while you are taking this medicine. In some patients, this medicine may cause a serious brain infection that may cause death. If you have any problems seeing, thinking, speaking, walking, or standing, tell your doctor right away. If you cannot reach your doctor, urgently seek other source of medical care. Do not become pregnant while taking this medicine. Women should inform their doctor if they wish to become pregnant or think they might be pregnant. There is   a potential for serious side effects to an unborn child. Talk to your health care professional or pharmacist for more information. Do not breast-feed an infant while taking this medicine. Check with your doctor or health care professional if you get an attack of  severe diarrhea, nausea and vomiting, or if you sweat a lot. The loss of too much body fluid can make it dangerous for you to take this medicine. What side effects may I notice from receiving this medicine? Side effects that you should report to your doctor or health care professional as soon as possible: -allergic reactions like skin rash, itching or hives, swelling of the face, lips, or tongue -breathing problems -changes in hearing -changes in vision -fast, irregular heartbeat -feeling faint or lightheaded, falls -pain, tingling, numbness in the hands or feet -right upper belly pain -seizures -swelling of the ankles, feet, hands -unusual bleeding or bruising -unusually weak or tired -vomiting -yellowing of the eyes or skin Side effects that usually do not require medical attention (report to your doctor or health care professional if they continue or are bothersome): -changes in emotions or moods -constipation -diarrhea -loss of appetite -headache -irritation at site where injected -nausea This list may not describe all possible side effects. Call your doctor for medical advice about side effects. You may report side effects to FDA at 1-800-FDA-1088. Where should I keep my medicine? This drug is given in a hospital or clinic and will not be stored at home. NOTE: This sheet is a summary. It may not cover all possible information. If you have questions about this medicine, talk to your doctor, pharmacist, or health care provider.  2015, Elsevier/Gold Standard. (2013-06-03 12:46:32) Zoledronic Acid injection (Hypercalcemia, Oncology) What is this medicine? ZOLEDRONIC ACID (ZOE le dron ik AS id) lowers the amount of calcium loss from bone. It is used to treat too much calcium in your blood from cancer. It is also used to prevent complications of cancer that has spread to the bone. This medicine may be used for other purposes; ask your health care provider or pharmacist if you have  questions. COMMON BRAND NAME(S): Zometa What should I tell my health care provider before I take this medicine? They need to know if you have any of these conditions: -aspirin-sensitive asthma -cancer, especially if you are receiving medicines used to treat cancer -dental disease or wear dentures -infection -kidney disease -receiving corticosteroids like dexamethasone or prednisone -an unusual or allergic reaction to zoledronic acid, other medicines, foods, dyes, or preservatives -pregnant or trying to get pregnant -breast-feeding How should I use this medicine? This medicine is for infusion into a vein. It is given by a health care professional in a hospital or clinic setting. Talk to your pediatrician regarding the use of this medicine in children. Special care may be needed. Overdosage: If you think you have taken too much of this medicine contact a poison control center or emergency room at once. NOTE: This medicine is only for you. Do not share this medicine with others. What if I miss a dose? It is important not to miss your dose. Call your doctor or health care professional if you are unable to keep an appointment. What may interact with this medicine? -certain antibiotics given by injection -NSAIDs, medicines for pain and inflammation, like ibuprofen or naproxen -some diuretics like bumetanide, furosemide -teriparatide -thalidomide This list may not describe all possible interactions. Give your health care provider a list of all the medicines, herbs, non-prescription drugs, or dietary   supplements you use. Also tell them if you smoke, drink alcohol, or use illegal drugs. Some items may interact with your medicine. What should I watch for while using this medicine? Visit your doctor or health care professional for regular checkups. It may be some time before you see the benefit from this medicine. Do not stop taking your medicine unless your doctor tells you to. Your doctor may  order blood tests or other tests to see how you are doing. Women should inform their doctor if they wish to become pregnant or think they might be pregnant. There is a potential for serious side effects to an unborn child. Talk to your health care professional or pharmacist for more information. You should make sure that you get enough calcium and vitamin D while you are taking this medicine. Discuss the foods you eat and the vitamins you take with your health care professional. Some people who take this medicine have severe bone, joint, and/or muscle pain. This medicine may also increase your risk for jaw problems or a broken thigh bone. Tell your doctor right away if you have severe pain in your jaw, bones, joints, or muscles. Tell your doctor if you have any pain that does not go away or that gets worse. Tell your dentist and dental surgeon that you are taking this medicine. You should not have major dental surgery while on this medicine. See your dentist to have a dental exam and fix any dental problems before starting this medicine. Take good care of your teeth while on this medicine. Make sure you see your dentist for regular follow-up appointments. What side effects may I notice from receiving this medicine? Side effects that you should report to your doctor or health care professional as soon as possible: -allergic reactions like skin rash, itching or hives, swelling of the face, lips, or tongue -anxiety, confusion, or depression -breathing problems -changes in vision -eye pain -feeling faint or lightheaded, falls -jaw pain, especially after dental work -mouth sores -muscle cramps, stiffness, or weakness -trouble passing urine or change in the amount of urine Side effects that usually do not require medical attention (report to your doctor or health care professional if they continue or are bothersome): -bone, joint, or muscle pain -constipation -diarrhea -fever -hair loss -irritation  at site where injected -loss of appetite -nausea, vomiting -stomach upset -trouble sleeping -trouble swallowing -weak or tired This list may not describe all possible side effects. Call your doctor for medical advice about side effects. You may report side effects to FDA at 1-800-FDA-1088. Where should I keep my medicine? This drug is given in a hospital or clinic and will not be stored at home. NOTE: This sheet is a summary. It may not cover all possible information. If you have questions about this medicine, talk to your doctor, pharmacist, or health care provider.  2015, Elsevier/Gold Standard. (2013-01-17 13:03:13)  

## 2014-11-21 LAB — SPEP & IFE WITH QIG
ABNORMAL PROTEIN BAND1: 0.3 g/dL
ABNORMAL PROTEIN BAND2: 0.2 g/dL
Albumin ELP: 3.6 g/dL — ABNORMAL LOW (ref 3.8–4.8)
Alpha-1-Globulin: 0.3 g/dL (ref 0.2–0.3)
Alpha-2-Globulin: 0.8 g/dL (ref 0.5–0.9)
Beta 2: 0.3 g/dL (ref 0.2–0.5)
Beta Globulin: 0.6 g/dL (ref 0.4–0.6)
Gamma Globulin: 0.9 g/dL (ref 0.8–1.7)
IGA: 259 mg/dL (ref 69–380)
IGG (IMMUNOGLOBIN G), SERUM: 1100 mg/dL (ref 690–1700)
IgM, Serum: 40 mg/dL — ABNORMAL LOW (ref 52–322)
Total Protein, Serum Electrophoresis: 6.5 g/dL (ref 6.1–8.1)

## 2014-11-21 LAB — BETA 2 MICROGLOBULIN, SERUM: Beta-2 Microglobulin: 1.94 mg/L (ref <=2.51)

## 2014-11-21 LAB — KAPPA/LAMBDA LIGHT CHAINS
KAPPA FREE LGHT CHN: 1.25 mg/dL (ref 0.33–1.94)
KAPPA LAMBDA RATIO: 0.2 — AB (ref 0.26–1.65)
LAMBDA FREE LGHT CHN: 6.31 mg/dL — AB (ref 0.57–2.63)

## 2014-11-24 ENCOUNTER — Ambulatory Visit (HOSPITAL_BASED_OUTPATIENT_CLINIC_OR_DEPARTMENT_OTHER): Payer: 59

## 2014-11-24 ENCOUNTER — Telehealth: Payer: Self-pay | Admitting: Hematology & Oncology

## 2014-11-24 DIAGNOSIS — C9000 Multiple myeloma not having achieved remission: Secondary | ICD-10-CM

## 2014-11-24 NOTE — Telephone Encounter (Signed)
Faxed Short-Term Disability papers to [employer] today.   AttnDoristine Johns - Benefit Specialist   [phone] fx  [phone] ph

## 2014-11-25 ENCOUNTER — Telehealth: Payer: Self-pay | Admitting: Hematology & Oncology

## 2014-11-25 NOTE — Telephone Encounter (Addendum)
Brien Few [id number] Case: [id number] Status: Approved w [employer] - (new insurance) Dates: 11/24/2014 - 07/24/2015 [id number] - Velcade [id number] - Zometa - supporting     COPY SCANNED

## 2014-11-26 ENCOUNTER — Ambulatory Visit (HOSPITAL_BASED_OUTPATIENT_CLINIC_OR_DEPARTMENT_OTHER): Payer: 59

## 2014-11-26 ENCOUNTER — Other Ambulatory Visit: Payer: 59

## 2014-11-26 DIAGNOSIS — Z5112 Encounter for antineoplastic immunotherapy: Secondary | ICD-10-CM

## 2014-11-26 DIAGNOSIS — C9000 Multiple myeloma not having achieved remission: Secondary | ICD-10-CM

## 2014-11-26 LAB — CMP (CANCER CENTER ONLY)
ALBUMIN: 3.5 g/dL (ref 3.3–5.5)
ALT: 15 U/L (ref 10–47)
AST: 19 U/L (ref 11–38)
Alkaline Phosphatase: 70 U/L (ref 26–84)
BUN: 7 mg/dL (ref 7–22)
CALCIUM: 8.8 mg/dL (ref 8.0–10.3)
CHLORIDE: 102 meq/L (ref 98–108)
CO2: 27 mEq/L (ref 18–33)
Creat: 0.8 mg/dl (ref 0.6–1.2)
Glucose, Bld: 85 mg/dL (ref 73–118)
POTASSIUM: 3.4 meq/L (ref 3.3–4.7)
SODIUM: 143 meq/L (ref 128–145)
Total Bilirubin: 0.5 mg/dl (ref 0.20–1.60)
Total Protein: 6.9 g/dL (ref 6.4–8.1)

## 2014-11-26 LAB — CBC WITH DIFFERENTIAL (CANCER CENTER ONLY)
BASO#: 0 10*3/uL (ref 0.0–0.2)
BASO%: 0.3 % (ref 0.0–2.0)
EOS%: 2.5 % (ref 0.0–7.0)
Eosinophils Absolute: 0.2 10*3/uL (ref 0.0–0.5)
HEMATOCRIT: 39.9 % (ref 34.8–46.6)
HEMOGLOBIN: 12.3 g/dL (ref 11.6–15.9)
LYMPH#: 1 10*3/uL (ref 0.9–3.3)
LYMPH%: 15.6 % (ref 14.0–48.0)
MCH: 29.5 pg (ref 26.0–34.0)
MCHC: 30.8 g/dL — ABNORMAL LOW (ref 32.0–36.0)
MCV: 96 fL (ref 81–101)
MONO#: 0.5 10*3/uL (ref 0.1–0.9)
MONO%: 7.8 % (ref 0.0–13.0)
NEUT#: 4.9 10*3/uL (ref 1.5–6.5)
NEUT%: 73.8 % (ref 39.6–80.0)
PLATELETS: 337 10*3/uL (ref 145–400)
RBC: 4.17 10*6/uL (ref 3.70–5.32)
RDW: 16.2 % — ABNORMAL HIGH (ref 11.1–15.7)
WBC: 6.7 10*3/uL (ref 3.9–10.0)

## 2014-11-26 MED ORDER — ONDANSETRON HCL 8 MG PO TABS
8.0000 mg | ORAL_TABLET | Freq: Once | ORAL | Status: AC
  Administered 2014-11-26: 8 mg via ORAL

## 2014-11-26 MED ORDER — ONDANSETRON HCL 8 MG PO TABS
ORAL_TABLET | ORAL | Status: AC
  Filled 2014-11-26: qty 1

## 2014-11-26 MED ORDER — BORTEZOMIB CHEMO SQ INJECTION 3.5 MG (2.5MG/ML)
1.3000 mg/m2 | Freq: Once | INTRAMUSCULAR | Status: AC
  Administered 2014-11-26: 2.25 mg via SUBCUTANEOUS
  Filled 2014-11-26: qty 2.25

## 2014-11-26 NOTE — Patient Instructions (Signed)
Bortezomib injection What is this medicine? BORTEZOMIB (bor TEZ oh mib) is a chemotherapy drug. It slows the growth of cancer cells. This medicine is used to treat multiple myeloma, and certain lymphomas, such as mantle-cell lymphoma. This medicine may be used for other purposes; ask your health care provider or pharmacist if you have questions. COMMON BRAND NAME(S): Velcade What should I tell my health care provider before I take this medicine? They need to know if you have any of these conditions: -diabetes -heart disease -irregular heartbeat -liver disease -on hemodialysis -low blood counts, like low white blood cells, platelets, or hemoglobin -peripheral neuropathy -taking medicine for blood pressure -an unusual or allergic reaction to bortezomib, mannitol, boron, other medicines, foods, dyes, or preservatives -pregnant or trying to get pregnant -breast-feeding How should I use this medicine? This medicine is for injection into a vein or for injection under the skin. It is given by a health care professional in a hospital or clinic setting. Talk to your pediatrician regarding the use of this medicine in children. Special care may be needed. Overdosage: If you think you have taken too much of this medicine contact a poison control center or emergency room at once. NOTE: This medicine is only for you. Do not share this medicine with others. What if I miss a dose? It is important not to miss your dose. Call your doctor or health care professional if you are unable to keep an appointment. What may interact with this medicine? This medicine may interact with the following medications: -ketoconazole -rifampin -ritonavir -St. John's Wort This list may not describe all possible interactions. Give your health care provider a list of all the medicines, herbs, non-prescription drugs, or dietary supplements you use. Also tell them if you smoke, drink alcohol, or use illegal drugs. Some items  may interact with your medicine. What should I watch for while using this medicine? Visit your doctor for checks on your progress. This drug may make you feel generally unwell. This is not uncommon, as chemotherapy can affect healthy cells as well as cancer cells. Report any side effects. Continue your course of treatment even though you feel ill unless your doctor tells you to stop. You may get drowsy or dizzy. Do not drive, use machinery, or do anything that needs mental alertness until you know how this medicine affects you. Do not stand or sit up quickly, especially if you are an older patient. This reduces the risk of dizzy or fainting spells. In some cases, you may be given additional medicines to help with side effects. Follow all directions for their use. Call your doctor or health care professional for advice if you get a fever, chills or sore throat, or other symptoms of a cold or flu. Do not treat yourself. This drug decreases your body's ability to fight infections. Try to avoid being around people who are sick. This medicine may increase your risk to bruise or bleed. Call your doctor or health care professional if you notice any unusual bleeding. You may need blood work done while you are taking this medicine. In some patients, this medicine may cause a serious brain infection that may cause death. If you have any problems seeing, thinking, speaking, walking, or standing, tell your doctor right away. If you cannot reach your doctor, urgently seek other source of medical care. Do not become pregnant while taking this medicine. Women should inform their doctor if they wish to become pregnant or think they might be pregnant. There is   a potential for serious side effects to an unborn child. Talk to your health care professional or pharmacist for more information. Do not breast-feed an infant while taking this medicine. Check with your doctor or health care professional if you get an attack of  severe diarrhea, nausea and vomiting, or if you sweat a lot. The loss of too much body fluid can make it dangerous for you to take this medicine. What side effects may I notice from receiving this medicine? Side effects that you should report to your doctor or health care professional as soon as possible: -allergic reactions like skin rash, itching or hives, swelling of the face, lips, or tongue -breathing problems -changes in hearing -changes in vision -fast, irregular heartbeat -feeling faint or lightheaded, falls -pain, tingling, numbness in the hands or feet -right upper belly pain -seizures -swelling of the ankles, feet, hands -unusual bleeding or bruising -unusually weak or tired -vomiting -yellowing of the eyes or skin Side effects that usually do not require medical attention (report to your doctor or health care professional if they continue or are bothersome): -changes in emotions or moods -constipation -diarrhea -loss of appetite -headache -irritation at site where injected -nausea This list may not describe all possible side effects. Call your doctor for medical advice about side effects. You may report side effects to FDA at 1-800-FDA-1088. Where should I keep my medicine? This drug is given in a hospital or clinic and will not be stored at home. NOTE: This sheet is a summary. It may not cover all possible information. If you have questions about this medicine, talk to your doctor, pharmacist, or health care provider.  2015, Elsevier/Gold Standard. (2013-06-03 12:46:32)  

## 2014-11-27 LAB — 24 HR URINE,KAPPA/LAMBDA LIGHT CHAINS
24H Urine Volume: 2100 mL/24 h
Measured Kappa Chain: <0.4 mg/dL (ref <2.00)

## 2014-11-27 LAB — UIFE/LIGHT CHAINS/TP QN, 24-HR UR
ALPHA 2 UR: DETECTED — AB
Albumin, U: DETECTED
Alpha 1, Urine: DETECTED — AB
Beta, Urine: DETECTED — AB
Gamma Globulin, Urine: DETECTED — AB
TIME-UPE24: 24 h
Volume, Urine: 2100 mL

## 2014-11-28 ENCOUNTER — Telehealth: Payer: Self-pay | Admitting: Nurse Practitioner

## 2014-11-28 LAB — PROTEIN ELECTROPHORESIS, SERUM, WITH REFLEX
ABNORMAL PROTEIN BAND2: 0.2 g/dL
ABNORMAL PROTEIN BAND3: NOT DETECTED g/dL
ALPHA-1-GLOBULIN: 0.3 g/dL (ref 0.2–0.3)
Abnormal Protein Band1: 0.3 g/dL
Albumin ELP: 3.6 g/dL — ABNORMAL LOW (ref 3.8–4.8)
Alpha-2-Globulin: 0.8 g/dL (ref 0.5–0.9)
Beta 2: 0.3 g/dL (ref 0.2–0.5)
Beta Globulin: 0.6 g/dL (ref 0.4–0.6)
Gamma Globulin: 0.9 g/dL (ref 0.8–1.7)
TOTAL PROTEIN, SERUM ELECTROPHOR: 6.5 g/dL (ref 6.1–8.1)

## 2014-11-28 LAB — IGG, IGA, IGM
IgA: 225 mg/dL (ref 69–380)
IgG (Immunoglobin G), Serum: 1010 mg/dL (ref 690–1700)
IgM, Serum: 32 mg/dL — ABNORMAL LOW (ref 52–322)

## 2014-11-28 LAB — KAPPA/LAMBDA LIGHT CHAINS
Kappa free light chain: 1.41 mg/dL (ref 0.33–1.94)
Kappa:Lambda Ratio: 0.27 (ref 0.26–1.65)
Lambda Free Lght Chn: 5.3 mg/dL — ABNORMAL HIGH (ref 0.57–2.63)

## 2014-11-28 LAB — IFE INTERPRETATION

## 2014-11-28 NOTE — Telephone Encounter (Addendum)
Pt verbalized understanding and appreciation.   ----- Message from Volanda Napoleon, MD sent at 11/28/2014 10:43 AM EDT ----- Call - no myeloma protein in the urine!!  pete

## 2014-12-03 ENCOUNTER — Ambulatory Visit (HOSPITAL_BASED_OUTPATIENT_CLINIC_OR_DEPARTMENT_OTHER): Payer: 59

## 2014-12-03 ENCOUNTER — Other Ambulatory Visit: Payer: Self-pay | Admitting: Emergency Medicine

## 2014-12-03 ENCOUNTER — Other Ambulatory Visit (HOSPITAL_BASED_OUTPATIENT_CLINIC_OR_DEPARTMENT_OTHER): Payer: 59

## 2014-12-03 VITALS — BP 95/56 | HR 78 | Temp 97.5°F | Resp 18

## 2014-12-03 DIAGNOSIS — Z5112 Encounter for antineoplastic immunotherapy: Secondary | ICD-10-CM

## 2014-12-03 DIAGNOSIS — C9000 Multiple myeloma not having achieved remission: Secondary | ICD-10-CM | POA: Diagnosis not present

## 2014-12-03 LAB — CBC WITH DIFFERENTIAL (CANCER CENTER ONLY)
BASO#: 0 10*3/uL (ref 0.0–0.2)
BASO%: 0.2 % (ref 0.0–2.0)
EOS%: 2.5 % (ref 0.0–7.0)
Eosinophils Absolute: 0.3 10*3/uL (ref 0.0–0.5)
HCT: 40.3 % (ref 34.8–46.6)
HEMOGLOBIN: 12.6 g/dL (ref 11.6–15.9)
LYMPH#: 1 10*3/uL (ref 0.9–3.3)
LYMPH%: 9.4 % — AB (ref 14.0–48.0)
MCH: 29.7 pg (ref 26.0–34.0)
MCHC: 31.3 g/dL — ABNORMAL LOW (ref 32.0–36.0)
MCV: 95 fL (ref 81–101)
MONO#: 1.8 10*3/uL — ABNORMAL HIGH (ref 0.1–0.9)
MONO%: 16.9 % — AB (ref 0.0–13.0)
NEUT#: 7.5 10*3/uL — ABNORMAL HIGH (ref 1.5–6.5)
NEUT%: 71 % (ref 39.6–80.0)
Platelets: 288 10*3/uL (ref 145–400)
RBC: 4.24 10*6/uL (ref 3.70–5.32)
RDW: 15.9 % — ABNORMAL HIGH (ref 11.1–15.7)
WBC: 10.6 10*3/uL — ABNORMAL HIGH (ref 3.9–10.0)

## 2014-12-03 LAB — CMP (CANCER CENTER ONLY)
ALT(SGPT): 16 U/L (ref 10–47)
AST: 17 U/L (ref 11–38)
Albumin: 3.6 g/dL (ref 3.3–5.5)
Alkaline Phosphatase: 80 U/L (ref 26–84)
BILIRUBIN TOTAL: 0.8 mg/dL (ref 0.20–1.60)
BUN, Bld: 9 mg/dL (ref 7–22)
CO2: 31 meq/L (ref 18–33)
Calcium: 9.5 mg/dL (ref 8.0–10.3)
Chloride: 103 mEq/L (ref 98–108)
Creat: 0.8 mg/dl (ref 0.6–1.2)
GLUCOSE: 87 mg/dL (ref 73–118)
Potassium: 3.3 mEq/L (ref 3.3–4.7)
Sodium: 140 mEq/L (ref 128–145)
Total Protein: 7.1 g/dL (ref 6.4–8.1)

## 2014-12-03 MED ORDER — BORTEZOMIB CHEMO SQ INJECTION 3.5 MG (2.5MG/ML)
1.3000 mg/m2 | Freq: Once | INTRAMUSCULAR | Status: AC
  Administered 2014-12-03: 2.25 mg via SUBCUTANEOUS
  Filled 2014-12-03: qty 2.25

## 2014-12-03 MED ORDER — ACYCLOVIR 400 MG PO TABS: 400.0000 mg | ORAL_TABLET | Freq: Two times a day (BID) | ORAL | Status: DC

## 2014-12-03 MED ORDER — ONDANSETRON HCL 8 MG PO TABS
8.0000 mg | ORAL_TABLET | Freq: Once | ORAL | Status: AC
  Administered 2014-12-03: 8 mg via ORAL

## 2014-12-03 MED ORDER — ONDANSETRON HCL 8 MG PO TABS
ORAL_TABLET | ORAL | Status: AC
  Filled 2014-12-03: qty 1

## 2014-12-03 NOTE — Progress Notes (Signed)
Patient reports that last Saturday 04/09 she experienced "facial droop" lasting approximately 1 minute. States that the symptoms resolved on is own. And denies seeking any medical treatment for symptoms. Denies any other complaints during this episode of "facial drooping". Patient requesting that she hold her dose of Dexamethasone for this cycle; stating that she believes this is what is causing these symptoms. Per Dr Marin Olp; she was instructed that she may hold the Dexamethasone on this coming Friday 04/15.  Patient also states that her insurance is only covering the Famvir at 21 tablets for $40. States that she has missed multiple doses. Per Dr Marin Olp; Rx sent to her local pharmacy for Acyclovir.

## 2014-12-03 NOTE — Patient Instructions (Signed)
Bortezomib injection What is this medicine? BORTEZOMIB (bor TEZ oh mib) is a chemotherapy drug. It slows the growth of cancer cells. This medicine is used to treat multiple myeloma, and certain lymphomas, such as mantle-cell lymphoma. This medicine may be used for other purposes; ask your health care provider or pharmacist if you have questions. COMMON BRAND NAME(S): Velcade What should I tell my health care provider before I take this medicine? They need to know if you have any of these conditions: -diabetes -heart disease -irregular heartbeat -liver disease -on hemodialysis -low blood counts, like low white blood cells, platelets, or hemoglobin -peripheral neuropathy -taking medicine for blood pressure -an unusual or allergic reaction to bortezomib, mannitol, boron, other medicines, foods, dyes, or preservatives -pregnant or trying to get pregnant -breast-feeding How should I use this medicine? This medicine is for injection into a vein or for injection under the skin. It is given by a health care professional in a hospital or clinic setting. Talk to your pediatrician regarding the use of this medicine in children. Special care may be needed. Overdosage: If you think you have taken too much of this medicine contact a poison control center or emergency room at once. NOTE: This medicine is only for you. Do not share this medicine with others. What if I miss a dose? It is important not to miss your dose. Call your doctor or health care professional if you are unable to keep an appointment. What may interact with this medicine? This medicine may interact with the following medications: -ketoconazole -rifampin -ritonavir -St. John's Wort This list may not describe all possible interactions. Give your health care provider a list of all the medicines, herbs, non-prescription drugs, or dietary supplements you use. Also tell them if you smoke, drink alcohol, or use illegal drugs. Some items  may interact with your medicine. What should I watch for while using this medicine? Visit your doctor for checks on your progress. This drug may make you feel generally unwell. This is not uncommon, as chemotherapy can affect healthy cells as well as cancer cells. Report any side effects. Continue your course of treatment even though you feel ill unless your doctor tells you to stop. You may get drowsy or dizzy. Do not drive, use machinery, or do anything that needs mental alertness until you know how this medicine affects you. Do not stand or sit up quickly, especially if you are an older patient. This reduces the risk of dizzy or fainting spells. In some cases, you may be given additional medicines to help with side effects. Follow all directions for their use. Call your doctor or health care professional for advice if you get a fever, chills or sore throat, or other symptoms of a cold or flu. Do not treat yourself. This drug decreases your body's ability to fight infections. Try to avoid being around people who are sick. This medicine may increase your risk to bruise or bleed. Call your doctor or health care professional if you notice any unusual bleeding. You may need blood work done while you are taking this medicine. In some patients, this medicine may cause a serious brain infection that may cause death. If you have any problems seeing, thinking, speaking, walking, or standing, tell your doctor right away. If you cannot reach your doctor, urgently seek other source of medical care. Do not become pregnant while taking this medicine. Women should inform their doctor if they wish to become pregnant or think they might be pregnant. There is   a potential for serious side effects to an unborn child. Talk to your health care professional or pharmacist for more information. Do not breast-feed an infant while taking this medicine. Check with your doctor or health care professional if you get an attack of  severe diarrhea, nausea and vomiting, or if you sweat a lot. The loss of too much body fluid can make it dangerous for you to take this medicine. What side effects may I notice from receiving this medicine? Side effects that you should report to your doctor or health care professional as soon as possible: -allergic reactions like skin rash, itching or hives, swelling of the face, lips, or tongue -breathing problems -changes in hearing -changes in vision -fast, irregular heartbeat -feeling faint or lightheaded, falls -pain, tingling, numbness in the hands or feet -right upper belly pain -seizures -swelling of the ankles, feet, hands -unusual bleeding or bruising -unusually weak or tired -vomiting -yellowing of the eyes or skin Side effects that usually do not require medical attention (report to your doctor or health care professional if they continue or are bothersome): -changes in emotions or moods -constipation -diarrhea -loss of appetite -headache -irritation at site where injected -nausea This list may not describe all possible side effects. Call your doctor for medical advice about side effects. You may report side effects to FDA at 1-800-FDA-1088. Where should I keep my medicine? This drug is given in a hospital or clinic and will not be stored at home. NOTE: This sheet is a summary. It may not cover all possible information. If you have questions about this medicine, talk to your doctor, pharmacist, or health care provider.  2015, Elsevier/Gold Standard. (2013-06-03 12:46:32)  You may hold your dose of Dexamethasone for this cycle; let us know if this helps with your symptoms.

## 2014-12-08 ENCOUNTER — Other Ambulatory Visit (HOSPITAL_COMMUNITY): Payer: Self-pay | Admitting: Hematology & Oncology

## 2014-12-09 ENCOUNTER — Ambulatory Visit (HOSPITAL_COMMUNITY)
Admission: RE | Admit: 2014-12-09 | Discharge: 2014-12-09 | Disposition: A | Discharge status: Home / Self Care | Payer: 59 | Source: Ambulatory Visit | Attending: Hematology & Oncology | Admitting: Hematology & Oncology

## 2014-12-09 ENCOUNTER — Other Ambulatory Visit: Payer: Self-pay | Admitting: *Deleted

## 2014-12-09 ENCOUNTER — Encounter (HOSPITAL_COMMUNITY): Payer: Self-pay

## 2014-12-09 VITALS — BP 98/66 | HR 71 | Temp 97.9°F | Resp 20

## 2014-12-09 DIAGNOSIS — C9000 Multiple myeloma not having achieved remission: Secondary | ICD-10-CM | POA: Diagnosis not present

## 2014-12-09 LAB — CBC WITH DIFFERENTIAL/PLATELET
BASOS ABS: 0 10*3/uL (ref 0.0–0.1)
Basophils Relative: 0 % (ref 0–1)
EOS ABS: 0.4 10*3/uL (ref 0.0–0.7)
Eosinophils Relative: 5 % (ref 0–5)
HCT: 37.1 % (ref 36.0–46.0)
HEMOGLOBIN: 11.2 g/dL — AB (ref 12.0–15.0)
Lymphocytes Relative: 12 % (ref 12–46)
Lymphs Abs: 0.9 10*3/uL (ref 0.7–4.0)
MCH: 29.2 pg (ref 26.0–34.0)
MCHC: 30.2 g/dL (ref 30.0–36.0)
MCV: 96.9 fL (ref 78.0–100.0)
MONO ABS: 1.5 10*3/uL — AB (ref 0.1–1.0)
Monocytes Relative: 21 % — ABNORMAL HIGH (ref 3–12)
NEUTROS ABS: 4.5 10*3/uL (ref 1.7–7.7)
Neutrophils Relative %: 62 % (ref 43–77)
Platelets: 219 10*3/uL (ref 150–400)
RBC: 3.83 MIL/uL — ABNORMAL LOW (ref 3.87–5.11)
RDW: 16 % — AB (ref 11.5–15.5)
WBC: 7.4 10*3/uL (ref 4.0–10.5)

## 2014-12-09 LAB — BONE MARROW EXAM: Bone Marrow Exam: 268

## 2014-12-09 MED ORDER — MEPERIDINE HCL 25 MG/ML IJ SOLN
INTRAMUSCULAR | Status: AC | PRN
  Administered 2014-12-09: 25 mg via INTRAVENOUS

## 2014-12-09 MED ORDER — MIDAZOLAM HCL 5 MG/5ML IJ SOLN
INTRAMUSCULAR | Status: AC | PRN
  Administered 2014-12-09: 1 mg via INTRAVENOUS

## 2014-12-09 MED ORDER — MEPERIDINE HCL 50 MG/ML IJ SOLN
50.0000 mg | Freq: Once | INTRAMUSCULAR | Status: DC
  Filled 2014-12-09: qty 1

## 2014-12-09 MED ORDER — MIDAZOLAM HCL 2 MG/2ML IJ SOLN
INTRAMUSCULAR | Status: AC | PRN
  Administered 2014-12-09 (×2): 1 mg via INTRAVENOUS

## 2014-12-09 MED ORDER — MIDAZOLAM HCL 10 MG/2ML IJ SOLN
10.0000 mg | Freq: Once | INTRAMUSCULAR | Status: DC
  Filled 2014-12-09: qty 2

## 2014-12-09 MED ORDER — SODIUM CHLORIDE 0.9 % IV SOLN: Freq: Once | INTRAVENOUS | Status: DC

## 2014-12-09 MED ORDER — LENALIDOMIDE 25 MG PO CAPS: ORAL_CAPSULE | ORAL | Status: DC

## 2014-12-09 NOTE — Procedures (Signed)
This is a bone marrow biopsy and aspirate note for [employer]. Per this was done to assess response to myeloma therapy.  She was brought to the short stay unit. She had an IV placed into her right hand.  Her Mallimpati score is 1. Her ASA class is 1.  We did the appropriate timeout procedure at 7:50 AM.  We then placed onto her right side. She received a total of 3 mg of Versed and 25 mg of Demerol for IV sedation.  The left posterior iliac crest region was prepped and draped in sterile fashion. 6 mL of lidocaine was infiltrated under the skin down to the periosteum.  We obtained to bone marrow aspirates with the combination aspirate biopsy needle. I then obtained a good bone marrow biopsy core.  She tolerated the procedure well. All of her vital signs remained stable.  I cleaned and dressed the procedure site sterilely.  There were no complications. Laurey Arrow

## 2014-12-09 NOTE — Sedation Documentation (Signed)
MD at bedside. 

## 2014-12-09 NOTE — Discharge Instructions (Signed)
Conscious Sedation, Adult, Care After °Refer to this sheet in the next few weeks. These instructions provide you with information on caring for yourself after your procedure. Your health care provider may also give you more specific instructions. Your treatment has been planned according to current medical practices, but problems sometimes occur. Call your health care provider if you have any problems or questions after your procedure. °WHAT TO EXPECT AFTER THE PROCEDURE  °After your procedure: °· You may feel sleepy, clumsy, and have poor balance for several hours. °· Vomiting may occur if you eat too soon after the procedure. °HOME CARE INSTRUCTIONS °· Do not participate in any activities where you could become injured for at least 24 hours. Do not: °¨ Drive. °¨ Swim. °¨ Ride a bicycle. °¨ Operate heavy machinery. °¨ Cook. °¨ Use power tools. °¨ Climb ladders. °¨ Work from a high place. °· Do not make important decisions or sign legal documents until you are improved. °· If you vomit, drink water, juice, or soup when you can drink without vomiting. Make sure you have little or no nausea before eating solid foods. °· Only take over-the-counter or prescription medicines for pain, discomfort, or fever as directed by your health care provider. °· Make sure you and your family fully understand everything about the medicines given to you, including what side effects may occur. °· You should not drink alcohol, take sleeping pills, or take medicines that cause drowsiness for at least 24 hours. °· If you smoke, do not smoke without supervision. °· If you are feeling better, you may resume normal activities 24 hours after you were sedated. °· Keep all appointments with your health care provider. °SEEK MEDICAL CARE IF: °· Your skin is pale or bluish in color. °· You continue to feel nauseous or vomit. °· Your pain is getting worse and is not helped by medicine. °· You have bleeding or swelling. °· You are still sleepy or  feeling clumsy after 24 hours. °SEEK IMMEDIATE MEDICAL CARE IF: °· You develop a rash. °· You have difficulty breathing. °· You develop any type of allergic problem. °· You have a fever. °MAKE SURE YOU: °· Understand these instructions. °· Will watch your condition. °· Will get help right away if you are not doing well or get worse. °Document Released: 05/29/2013 Document Reviewed: 05/29/2013 °ExitCare® Patient Information ©2015 [employer]. This information is not intended to replace advice given to you by your health care provider. Make sure you discuss any questions you have with your health care provider. °Bone Biopsy, Needle, Care After °Read the instructions outlined below and refer to this sheet in the next few weeks. These discharge instructions provide you with general information on caring for yourself after you leave the hospital. Your caregiver may also give you specific instructions. While your treatment has been planned according to the most current medical practices available, unavoidable complications sometimes occur. If you have any problems or questions after discharge, call your caregiver. °Finding out the results of your test °Not all test results are available during your visit. If your test results are not back during the visit, make an appointment with your caregiver to find out the results. Do not assume everything is normal if you have not heard from your caregiver or the medical facility. It is important for you to follow up on all of your test results.  °SEEK MEDICAL CARE IF:  °· You have redness, swelling, or increasing pain at the site of the biopsy. °·   You have pus coming from the biopsy site. °· You have drainage from the biopsy site lasting longer than 1 day. °· You notice a bad smell coming from the biopsy site or dressing. °· You develop persistent nausea or vomiting. °SEEK IMMEDIATE MEDICAL CARE IF: °· You have a fever. °· You develop a rash. °· You have difficulty  breathing. °· You develop any reaction or side effects to medicines given. °Document Released: 02/25/2005 Document Revised: 05/29/2013 Document Reviewed: 01/13/2009 °ExitCare® Patient Information ©2015 [employer]. This information is not intended to replace advice given to you by your health care provider. Make sure you discuss any questions you have with your health care provider. ° °

## 2014-12-09 NOTE — Sedation Documentation (Signed)
Procedure complete. Pt turned supine.

## 2014-12-10 ENCOUNTER — Telehealth: Payer: Self-pay | Admitting: Hematology & Oncology

## 2014-12-10 NOTE — Telephone Encounter (Signed)
Faxed medical records to:   [facility]  AttnLegrand Como - Counselor   [phone] fx   [phone] ph       Records: 11/20/2013     COPY SCANNED

## 2014-12-11 NOTE — Progress Notes (Unsigned)
Received fax from [place] that pt's Revlimid has been approved for coverage until 12/09/15 to be administered by [employer]. dph

## 2014-12-12 ENCOUNTER — Encounter: Payer: Self-pay | Admitting: *Deleted

## 2014-12-12 NOTE — Progress Notes (Unsigned)
Patient had been referred to [facility] transplant team per Dr Marin Olp. Received a call from [place] stating that patient insurance will only provide possible coverage for transplant at [facility]. Notified Dr Marin Olp. Referral will be made to [facility].

## 2014-12-15 ENCOUNTER — Encounter: Payer: Self-pay | Admitting: Nurse Practitioner

## 2014-12-15 NOTE — Progress Notes (Signed)
All requested information has been faxed to [facility] for processing and scheduling of appointments. Pt is aware information has been sent as she called stating insurance would not cover [facility]. Confirmation received for fax. Fax sent to [facility] @ [phone].

## 2014-12-15 NOTE — Progress Notes (Signed)
Per Dr. Marin Olp pt is to stop taking Revlimid until further notice.

## 2014-12-16 LAB — TISSUE HYBRIDIZATION (BONE MARROW)-NCBH

## 2014-12-16 LAB — CHROMOSOME ANALYSIS, BONE MARROW

## 2014-12-17 ENCOUNTER — Ambulatory Visit (HOSPITAL_BASED_OUTPATIENT_CLINIC_OR_DEPARTMENT_OTHER): Payer: 59

## 2014-12-17 ENCOUNTER — Ambulatory Visit (HOSPITAL_BASED_OUTPATIENT_CLINIC_OR_DEPARTMENT_OTHER): Payer: 59 | Admitting: Hematology & Oncology

## 2014-12-17 ENCOUNTER — Encounter: Payer: Self-pay | Admitting: Hematology & Oncology

## 2014-12-17 ENCOUNTER — Other Ambulatory Visit (HOSPITAL_BASED_OUTPATIENT_CLINIC_OR_DEPARTMENT_OTHER): Payer: 59

## 2014-12-17 VITALS — BP 91/67 | HR 88 | Temp 98.3°F | Resp 14 | Ht 64.0 in | Wt 146.0 lb

## 2014-12-17 DIAGNOSIS — C9000 Multiple myeloma not having achieved remission: Secondary | ICD-10-CM

## 2014-12-17 DIAGNOSIS — Z5112 Encounter for antineoplastic immunotherapy: Secondary | ICD-10-CM | POA: Diagnosis not present

## 2014-12-17 LAB — CMP (CANCER CENTER ONLY)
ALK PHOS: 80 U/L (ref 26–84)
ALT(SGPT): 18 U/L (ref 10–47)
AST: 21 U/L (ref 11–38)
Albumin: 3.6 g/dL (ref 3.3–5.5)
BUN, Bld: 11 mg/dL (ref 7–22)
CO2: 30 mEq/L (ref 18–33)
Calcium: 9.1 mg/dL (ref 8.0–10.3)
Chloride: 101 mEq/L (ref 98–108)
Creat: 0.9 mg/dl (ref 0.6–1.2)
Glucose, Bld: 81 mg/dL (ref 73–118)
POTASSIUM: 3.4 meq/L (ref 3.3–4.7)
SODIUM: 138 meq/L (ref 128–145)
TOTAL PROTEIN: 7.1 g/dL (ref 6.4–8.1)
Total Bilirubin: 0.7 mg/dl (ref 0.20–1.60)

## 2014-12-17 LAB — CBC WITH DIFFERENTIAL (CANCER CENTER ONLY)
BASO#: 0.1 10*3/uL (ref 0.0–0.2)
BASO%: 0.7 % (ref 0.0–2.0)
EOS%: 1.7 % (ref 0.0–7.0)
Eosinophils Absolute: 0.2 10*3/uL (ref 0.0–0.5)
HCT: 38.2 % (ref 34.8–46.6)
HGB: 11.8 g/dL (ref 11.6–15.9)
LYMPH#: 1.5 10*3/uL (ref 0.9–3.3)
LYMPH%: 17 % (ref 14.0–48.0)
MCH: 30.1 pg (ref 26.0–34.0)
MCHC: 30.9 g/dL — AB (ref 32.0–36.0)
MCV: 97 fL (ref 81–101)
MONO#: 1.7 10*3/uL — ABNORMAL HIGH (ref 0.1–0.9)
MONO%: 18.8 % — AB (ref 0.0–13.0)
NEUT#: 5.5 10*3/uL (ref 1.5–6.5)
NEUT%: 61.8 % (ref 39.6–80.0)
Platelets: 508 10*3/uL — ABNORMAL HIGH (ref 145–400)
RBC: 3.92 10*6/uL (ref 3.70–5.32)
RDW: 15.3 % (ref 11.1–15.7)
WBC: 8.9 10*3/uL (ref 3.9–10.0)

## 2014-12-17 MED ORDER — BORTEZOMIB CHEMO SQ INJECTION 3.5 MG (2.5MG/ML)
1.3000 mg/m2 | Freq: Once | INTRAMUSCULAR | Status: AC
  Administered 2014-12-17: 2.25 mg via SUBCUTANEOUS
  Filled 2014-12-17: qty 2.25

## 2014-12-17 MED ORDER — ONDANSETRON HCL 8 MG PO TABS
ORAL_TABLET | ORAL | Status: AC
  Filled 2014-12-17: qty 1

## 2014-12-17 MED ORDER — ONDANSETRON HCL 8 MG PO TABS
8.0000 mg | ORAL_TABLET | Freq: Once | ORAL | Status: AC
  Administered 2014-12-17: 8 mg via ORAL

## 2014-12-17 MED ORDER — ZOLEDRONIC ACID 4 MG/100ML IV SOLN
4.0000 mg | Freq: Once | INTRAVENOUS | Status: AC
  Administered 2014-12-17: 4 mg via INTRAVENOUS
  Filled 2014-12-17: qty 100

## 2014-12-17 MED ORDER — SODIUM CHLORIDE 0.9 % IV SOLN
Freq: Once | INTRAVENOUS | Status: AC
  Administered 2014-12-17: 14:00:00 via INTRAVENOUS

## 2014-12-17 NOTE — Progress Notes (Signed)
Hematology and Oncology Follow Up Visit  Samantha Moyer [id number] 1965/12/05 49 y.o. 12/17/2014   Principle Diagnosis:   IgA lambda myeloma  Current Therapy:    Radiation therapy for pathologic fracture of the left humerus  Velcade/Revlimid/Decadron-patient - status post cycle 5  Zometa 4 mg IV every month     Interim History:  Ms.  Moyer is in for follow-up. She is doing well. We did a bone marrow test on her last week. The result is incredible. The pathology report ( ([id number]) showed a normocellular marrow with less than 10% plasma cells. I do not have back yet the cytogenetics. She did have some abnormal cytogenetics.  Her myeloma studies have shown a very nice response. Her last M spike was 0.5 g/dL. Her IgA level was 101 0 mg/dL. Her lambda light chain was 5.3 mg/dL.  She will be going down to [facility] for consideration of transplant. Not sure when her appointment will be.  She is not hurting. She's having better use of her left arm. She had a fracture in that arm that was repaired. She had radiation therapy post surgical repair.  She's had no change in bowel or bladder habits. She's had no cough. This she's had no infections. She's had no rashes.   Overall, her performance status is ECOG 1.  Medications:  Current outpatient prescriptions:  .  acyclovir (ZOVIRAX) 400 MG tablet, Take 1 tablet (400 mg total) by mouth 2 (two) times daily., Disp: 60 tablet, Rfl: 6 .  aspirin EC 81 MG tablet, Take 81 mg by mouth daily., Disp: , Rfl:  .  Multiple Vitamins-Minerals (ALIVE WOMENS ENERGY) TABS, Take 1 tablet by mouth daily., Disp: , Rfl:  .  ondansetron (ZOFRAN) 8 MG tablet, Take 1 tablet (8 mg total) by mouth 2 (two) times daily. Start the day after chemo for 2 days. Then take as needed for nausea or vomiting., Disp: 30 tablet, Rfl: 1 .  famciclovir (FAMVIR) 500 MG tablet, Take 1 pill a day (Patient not taking: Reported on 12/17/2014), Disp: 30 tablet, Rfl: 6 .  lenalidomide  (REVLIMID) 25 MG capsule, Take 1 pill at nighttime for 21 days on and 7 days off. Auth # [id number] (Patient not taking: Reported on 12/17/2014), Disp: 21 capsule, Rfl: 0 No current facility-administered medications for this visit.  Facility-Administered Medications Ordered in Other Visits:  .  0.9 %  sodium chloride infusion, , Intravenous, Once, Volanda Napoleon, MD .  zolendronic acid (ZOMETA) 4 mg in sodium chloride 0.9 % 100 mL IVPB, 4 mg, Intravenous, Once, Volanda Napoleon, MD  Allergies:  Allergies  Allergen Reactions  . Codeine Nausea Only  . Tramadol Nausea And Vomiting and Other (See Comments)    Past Medical History, Surgical history, Social history, and Family History were reviewed and updated.  Review of Systems: As above  Physical Exam:  height is 5\' 4"  (1.626 m) and weight is 146 lb (66.225 kg). Her oral temperature is 98.3 F (36.8 C). Her blood pressure is 91/67 and her pulse is 88. Her respiration is 14.   [name] in no obvious distress. Head and neck exam shows no ocular or oral lesions. There are no palpable cervical or supraclavicular lymph nodes. Lungs are clear. Cardiac exam regular rate and rhythm. There are no murmurs, rubs or bruits. Abdomen is soft. She has good bowel sounds. There is no fluid wave. There is no palpable liver or spleen tip. Back exam shows no tenderness  over the spine, ribs or hips. Extremities shows decreased range motion of the left arm. There is no edema in the legs. She has no weakness in the legs. Skin exam shows no rashes, ecchymoses or petechia. Neurological exam is nonfocal.  Lab Results  Component Value Date   WBC 8.9 12/17/2014   HGB 11.8 12/17/2014   HCT 38.2 12/17/2014   MCV 97 12/17/2014   PLT 508* 12/17/2014     Chemistry      Component Value Date/Time   NA 138 12/17/2014 1235   NA 137 10/01/2014 1405   K 3.4 12/17/2014 1235   K 3.7 10/01/2014 1405   CL 101 12/17/2014 1235   CL  100 10/01/2014 1405   CO2 30 12/17/2014 1235   CO2 28 10/01/2014 1405   BUN 11 12/17/2014 1235   BUN 8 10/01/2014 1405   CREATININE 0.9 12/17/2014 1235   CREATININE 0.59 10/01/2014 1405      Component Value Date/Time   CALCIUM 9.1 12/17/2014 1235   CALCIUM 9.5 10/01/2014 1405   ALKPHOS 80 12/17/2014 1235   ALKPHOS 97 10/01/2014 1405   AST 21 12/17/2014 1235   AST 13 10/01/2014 1405   ALT 18 12/17/2014 1235   ALT 10 10/01/2014 1405   BILITOT 0.70 12/17/2014 1235   BILITOT 0.3 10/01/2014 1405         Impression and Plan: Samantha Moyer is 49 year old African American female. She has fairly aggressive IgA lambda myeloma. 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  She's responded very nicely to date. I think that we have reached the peak of effectiveness for chemotherapy.  I really believe that transplant will be the way to go. She has had some chromosomal abnormalities. We will see what the new cytogenetic report shows.  She hopefully will have the transplant sometime in May. I much or how scheduling goes down at [facility].  I will plan to see her back here in about 1 month. Again, she may be into the transplant and hopefully will be. If so, the transplant doctors at [facility] will let me know.   I spent about 35 minutes with Samantha Moyer and her best friend.    Volanda Napoleon, MD 4/27/20161:43 PM

## 2014-12-17 NOTE — Patient Instructions (Signed)
Bortezomib injection What is this medicine? BORTEZOMIB (bor TEZ oh mib) is a chemotherapy drug. It slows the growth of cancer cells. This medicine is used to treat multiple myeloma, and certain lymphomas, such as mantle-cell lymphoma. This medicine may be used for other purposes; ask your health care provider or pharmacist if you have questions. COMMON BRAND NAME(S): Velcade What should I tell my health care provider before I take this medicine? They need to know if you have any of these conditions: -diabetes -heart disease -irregular heartbeat -liver disease -on hemodialysis -low blood counts, like low white blood cells, platelets, or hemoglobin -peripheral neuropathy -taking medicine for blood pressure -an unusual or allergic reaction to bortezomib, mannitol, boron, other medicines, foods, dyes, or preservatives -pregnant or trying to get pregnant -breast-feeding How should I use this medicine? This medicine is for injection into a vein or for injection under the skin. It is given by a health care professional in a hospital or clinic setting. Talk to your pediatrician regarding the use of this medicine in children. Special care may be needed. Overdosage: If you think you have taken too much of this medicine contact a poison control center or emergency room at once. NOTE: This medicine is only for you. Do not share this medicine with others. What if I miss a dose? It is important not to miss your dose. Call your doctor or health care professional if you are unable to keep an appointment. What may interact with this medicine? This medicine may interact with the following medications: -ketoconazole -rifampin -ritonavir -St. John's Wort This list may not describe all possible interactions. Give your health care provider a list of all the medicines, herbs, non-prescription drugs, or dietary supplements you use. Also tell them if you smoke, drink alcohol, or use illegal drugs. Some items  may interact with your medicine. What should I watch for while using this medicine? Visit your doctor for checks on your progress. This drug may make you feel generally unwell. This is not uncommon, as chemotherapy can affect healthy cells as well as cancer cells. Report any side effects. Continue your course of treatment even though you feel ill unless your doctor tells you to stop. You may get drowsy or dizzy. Do not drive, use machinery, or do anything that needs mental alertness until you know how this medicine affects you. Do not stand or sit up quickly, especially if you are an older patient. This reduces the risk of dizzy or fainting spells. In some cases, you may be given additional medicines to help with side effects. Follow all directions for their use. Call your doctor or health care professional for advice if you get a fever, chills or sore throat, or other symptoms of a cold or flu. Do not treat yourself. This drug decreases your body's ability to fight infections. Try to avoid being around people who are sick. This medicine may increase your risk to bruise or bleed. Call your doctor or health care professional if you notice any unusual bleeding. You may need blood work done while you are taking this medicine. In some patients, this medicine may cause a serious brain infection that may cause death. If you have any problems seeing, thinking, speaking, walking, or standing, tell your doctor right away. If you cannot reach your doctor, urgently seek other source of medical care. Do not become pregnant while taking this medicine. Women should inform their doctor if they wish to become pregnant or think they might be pregnant. There is   a potential for serious side effects to an unborn child. Talk to your health care professional or pharmacist for more information. Do not breast-feed an infant while taking this medicine. Check with your doctor or health care professional if you get an attack of  severe diarrhea, nausea and vomiting, or if you sweat a lot. The loss of too much body fluid can make it dangerous for you to take this medicine. What side effects may I notice from receiving this medicine? Side effects that you should report to your doctor or health care professional as soon as possible: -allergic reactions like skin rash, itching or hives, swelling of the face, lips, or tongue -breathing problems -changes in hearing -changes in vision -fast, irregular heartbeat -feeling faint or lightheaded, falls -pain, tingling, numbness in the hands or feet -right upper belly pain -seizures -swelling of the ankles, feet, hands -unusual bleeding or bruising -unusually weak or tired -vomiting -yellowing of the eyes or skin Side effects that usually do not require medical attention (report to your doctor or health care professional if they continue or are bothersome): -changes in emotions or moods -constipation -diarrhea -loss of appetite -headache -irritation at site where injected -nausea This list may not describe all possible side effects. Call your doctor for medical advice about side effects. You may report side effects to FDA at 1-800-FDA-1088. Where should I keep my medicine? This drug is given in a hospital or clinic and will not be stored at home. NOTE: This sheet is a summary. It may not cover all possible information. If you have questions about this medicine, talk to your doctor, pharmacist, or health care provider.  2015, Elsevier/Gold Standard. (2013-06-03 12:46:32)  You may hold your dose of Dexamethasone for this cycle; let us know if this helps with your symptoms.  Zoledronic Acid injection (Hypercalcemia, Oncology) What is this medicine? ZOLEDRONIC ACID (ZOE le dron ik AS id) lowers the amount of calcium loss from bone. It is used to treat too much calcium in your blood from cancer. It is also used to prevent complications of cancer that has spread to the  bone. This medicine may be used for other purposes; ask your health care provider or pharmacist if you have questions. COMMON BRAND NAME(S): Zometa What should I tell my health care provider before I take this medicine? They need to know if you have any of these conditions: -aspirin-sensitive asthma -cancer, especially if you are receiving medicines used to treat cancer -dental disease or wear dentures -infection -kidney disease -receiving corticosteroids like dexamethasone or prednisone -an unusual or allergic reaction to zoledronic acid, other medicines, foods, dyes, or preservatives -pregnant or trying to get pregnant -breast-feeding How should I use this medicine? This medicine is for infusion into a vein. It is given by a health care professional in a hospital or clinic setting. Talk to your pediatrician regarding the use of this medicine in children. Special care may be needed. Overdosage: If you think you have taken too much of this medicine contact a poison control center or emergency room at once. NOTE: This medicine is only for you. Do not share this medicine with others. What if I miss a dose? It is important not to miss your dose. Call your doctor or health care professional if you are unable to keep an appointment. What may interact with this medicine? -certain antibiotics given by injection -NSAIDs, medicines for pain and inflammation, like ibuprofen or naproxen -some diuretics like bumetanide, furosemide -teriparatide -thalidomide This list may  not describe all possible interactions. Give your health care provider a list of all the medicines, herbs, non-prescription drugs, or dietary supplements you use. Also tell them if you smoke, drink alcohol, or use illegal drugs. Some items may interact with your medicine. What should I watch for while using this medicine? Visit your doctor or health care professional for regular checkups. It may be some time before you see the  benefit from this medicine. Do not stop taking your medicine unless your doctor tells you to. Your doctor may order blood tests or other tests to see how you are doing. Women should inform their doctor if they wish to become pregnant or think they might be pregnant. There is a potential for serious side effects to an unborn child. Talk to your health care professional or pharmacist for more information. You should make sure that you get enough calcium and vitamin D while you are taking this medicine. Discuss the foods you eat and the vitamins you take with your health care professional. Some people who take this medicine have severe bone, joint, and/or muscle pain. This medicine may also increase your risk for jaw problems or a broken thigh bone. Tell your doctor right away if you have severe pain in your jaw, bones, joints, or muscles. Tell your doctor if you have any pain that does not go away or that gets worse. Tell your dentist and dental surgeon that you are taking this medicine. You should not have major dental surgery while on this medicine. See your dentist to have a dental exam and fix any dental problems before starting this medicine. Take good care of your teeth while on this medicine. Make sure you see your dentist for regular follow-up appointments. What side effects may I notice from receiving this medicine? Side effects that you should report to your doctor or health care professional as soon as possible: -allergic reactions like skin rash, itching or hives, swelling of the face, lips, or tongue -anxiety, confusion, or depression -breathing problems -changes in vision -eye pain -feeling faint or lightheaded, falls -jaw pain, especially after dental work -mouth sores -muscle cramps, stiffness, or weakness -trouble passing urine or change in the amount of urine Side effects that usually do not require medical attention (report to your doctor or health care professional if they continue  or are bothersome): -bone, joint, or muscle pain -constipation -diarrhea -fever -hair loss -irritation at site where injected -loss of appetite -nausea, vomiting -stomach upset -trouble sleeping -trouble swallowing -weak or tired This list may not describe all possible side effects. Call your doctor for medical advice about side effects. You may report side effects to FDA at 1-800-FDA-1088. Where should I keep my medicine? This drug is given in a hospital or clinic and will not be stored at home. NOTE: This sheet is a summary. It may not cover all possible information. If you have questions about this medicine, talk to your doctor, pharmacist, or health care provider.  2015, Elsevier/Gold Standard. (2013-01-17 13:03:13)

## 2014-12-24 ENCOUNTER — Telehealth: Payer: Self-pay | Admitting: Hematology & Oncology

## 2014-12-24 ENCOUNTER — Ambulatory Visit: Payer: 59

## 2014-12-24 ENCOUNTER — Other Ambulatory Visit (HOSPITAL_BASED_OUTPATIENT_CLINIC_OR_DEPARTMENT_OTHER): Payer: 59

## 2014-12-24 DIAGNOSIS — C9000 Multiple myeloma not having achieved remission: Secondary | ICD-10-CM

## 2014-12-24 LAB — CBC WITH DIFFERENTIAL (CANCER CENTER ONLY)
BASO#: 0 10*3/uL (ref 0.0–0.2)
BASO%: 0.3 % (ref 0.0–2.0)
EOS ABS: 0.1 10*3/uL (ref 0.0–0.5)
EOS%: 1.5 % (ref 0.0–7.0)
HCT: 37.6 % (ref 34.8–46.6)
HGB: 11.6 g/dL (ref 11.6–15.9)
LYMPH#: 1.1 10*3/uL (ref 0.9–3.3)
LYMPH%: 15.9 % (ref 14.0–48.0)
MCH: 30.3 pg (ref 26.0–34.0)
MCHC: 30.9 g/dL — ABNORMAL LOW (ref 32.0–36.0)
MCV: 98 fL (ref 81–101)
MONO#: 1.1 10*3/uL — AB (ref 0.1–0.9)
MONO%: 15.6 % — ABNORMAL HIGH (ref 0.0–13.0)
NEUT#: 4.6 10*3/uL (ref 1.5–6.5)
NEUT%: 66.7 % (ref 39.6–80.0)
PLATELETS: 379 10*3/uL (ref 145–400)
RBC: 3.83 10*6/uL (ref 3.70–5.32)
RDW: 15.1 % (ref 11.1–15.7)
WBC: 6.9 10*3/uL (ref 3.9–10.0)

## 2014-12-24 LAB — CMP (CANCER CENTER ONLY)
ALT: 13 U/L (ref 10–47)
AST: 20 U/L (ref 11–38)
Albumin: 3.2 g/dL — ABNORMAL LOW (ref 3.3–5.5)
Alkaline Phosphatase: 76 U/L (ref 26–84)
BUN, Bld: 11 mg/dL (ref 7–22)
CO2: 31 mEq/L (ref 18–33)
Calcium: 9.3 mg/dL (ref 8.0–10.3)
Chloride: 104 mEq/L (ref 98–108)
Creat: 0.7 mg/dl (ref 0.6–1.2)
Glucose, Bld: 109 mg/dL (ref 73–118)
Potassium: 4.4 mEq/L (ref 3.3–4.7)
Sodium: 143 mEq/L (ref 128–145)
TOTAL PROTEIN: 7.2 g/dL (ref 6.4–8.1)
Total Bilirubin: 0.5 mg/dl (ref 0.20–1.60)

## 2014-12-24 NOTE — Progress Notes (Signed)
Patient refused treatment today. She states she has an appointment at [facility] on Monday and they stated that she should not have any chemo in her system at that appointment. Left before we could notify Dr Marin Olp. Dr Marin Olp aware.

## 2014-12-24 NOTE — Telephone Encounter (Signed)
Faxed Short-Term Disability papers to [employer] today.   AttnDoristine Johns - Benefit Specialist   [phone] fx  [phone] ph

## 2014-12-25 ENCOUNTER — Telehealth: Payer: Self-pay | Admitting: Hematology & Oncology

## 2014-12-25 NOTE — Telephone Encounter (Signed)
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

## 2014-12-26 ENCOUNTER — Encounter: Payer: Self-pay | Admitting: Hematology & Oncology

## 2014-12-31 ENCOUNTER — Ambulatory Visit (HOSPITAL_BASED_OUTPATIENT_CLINIC_OR_DEPARTMENT_OTHER): Payer: 59

## 2014-12-31 ENCOUNTER — Ambulatory Visit: Payer: BC Managed Care – PPO

## 2014-12-31 DIAGNOSIS — C9000 Multiple myeloma not having achieved remission: Secondary | ICD-10-CM

## 2014-12-31 LAB — CBC WITH DIFFERENTIAL (CANCER CENTER ONLY)
BASO#: 0 10*3/uL (ref 0.0–0.2)
BASO%: 0.1 % (ref 0.0–2.0)
EOS%: 1.2 % (ref 0.0–7.0)
Eosinophils Absolute: 0.1 10*3/uL (ref 0.0–0.5)
HCT: 39 % (ref 34.8–46.6)
HGB: 12.2 g/dL (ref 11.6–15.9)
LYMPH#: 1.2 10*3/uL (ref 0.9–3.3)
LYMPH%: 14 % (ref 14.0–48.0)
MCH: 30.6 pg (ref 26.0–34.0)
MCHC: 31.3 g/dL — ABNORMAL LOW (ref 32.0–36.0)
MCV: 98 fL (ref 81–101)
MONO#: 0.7 10*3/uL (ref 0.1–0.9)
MONO%: 8 % (ref 0.0–13.0)
NEUT%: 76.7 % (ref 39.6–80.0)
NEUTROS ABS: 6.4 10*3/uL (ref 1.5–6.5)
Platelets: 359 10*3/uL (ref 145–400)
RBC: 3.99 10*6/uL (ref 3.70–5.32)
RDW: 14.9 % (ref 11.1–15.7)
WBC: 8.3 10*3/uL (ref 3.9–10.0)

## 2014-12-31 LAB — COMPREHENSIVE METABOLIC PANEL (CC13)
ALK PHOS: 93 U/L (ref 40–150)
ALT: 8 U/L (ref 0–55)
ANION GAP: 10 meq/L (ref 3–11)
AST: 17 U/L (ref 5–34)
Albumin: 3.5 g/dL (ref 3.5–5.0)
BILIRUBIN TOTAL: 0.43 mg/dL (ref 0.20–1.20)
BUN: 8.1 mg/dL (ref 7.0–26.0)
CO2: 26 meq/L (ref 22–29)
CREATININE: 0.8 mg/dL (ref 0.6–1.1)
Calcium: 9.4 mg/dL (ref 8.4–10.4)
Chloride: 104 mEq/L (ref 98–109)
EGFR: >90 mL/min/{1.73_m2} (ref >90)
GLUCOSE: 92 mg/dL (ref 70–140)
Potassium: 3.9 mEq/L (ref 3.5–5.1)
Sodium: 140 mEq/L (ref 136–145)
TOTAL PROTEIN: 7.3 g/dL (ref 6.4–8.3)

## 2015-01-02 LAB — IGG, IGA, IGM
IGG (IMMUNOGLOBIN G), SERUM: 1280 mg/dL (ref 690–1700)
IgA: 302 mg/dL (ref 69–380)
IgM, Serum: 34 mg/dL — ABNORMAL LOW (ref 52–322)

## 2015-01-02 LAB — KAPPA/LAMBDA LIGHT CHAINS
KAPPA LAMBDA RATIO: 0.2 — AB (ref 0.26–1.65)
Kappa free light chain: 1.55 mg/dL (ref 0.33–1.94)
LAMBDA FREE LGHT CHN: 7.9 mg/dL — AB (ref 0.57–2.63)

## 2015-01-02 LAB — PROTEIN ELECTROPHORESIS, SERUM, WITH REFLEX
ABNORMAL PROTEIN BAND1: 0.3 g/dL
ALPHA-1-GLOBULIN: 0.4 g/dL — AB (ref 0.2–0.3)
Abnormal Protein Band2: 0.2 g/dL
Abnormal Protein Band3: NOT DETECTED g/dL
Albumin ELP: 3.6 g/dL — ABNORMAL LOW (ref 3.8–4.8)
Alpha-2-Globulin: 1 g/dL — ABNORMAL HIGH (ref 0.5–0.9)
BETA GLOBULIN: 0.7 g/dL — AB (ref 0.4–0.6)
Beta 2: 0.3 g/dL (ref 0.2–0.5)
GAMMA GLOBULIN: 1.1 g/dL (ref 0.8–1.7)
TOTAL PROTEIN, SERUM ELECTROPHOR: 7 g/dL (ref 6.1–8.1)

## 2015-01-02 LAB — BETA 2 MICROGLOBULIN, SERUM: Beta-2 Microglobulin: 2.22 mg/L (ref <=2.51)

## 2015-01-02 LAB — IFE INTERPRETATION

## 2015-01-02 LAB — LACTATE DEHYDROGENASE: LDH: 159 U/L (ref 94–250)

## 2015-01-07 IMAGING — CT CT BIOPSY
1 series · 1 of 22 positions shown · non-contrast
Comparison: none

CLINICAL DATA: Marteleur myeloma. The patient requires bone marrow
biopsy.

[Series 2: localizer · axial · 5.0mm · 0.74mm/px · 1 of 22 slices shown]
[im 12/22]
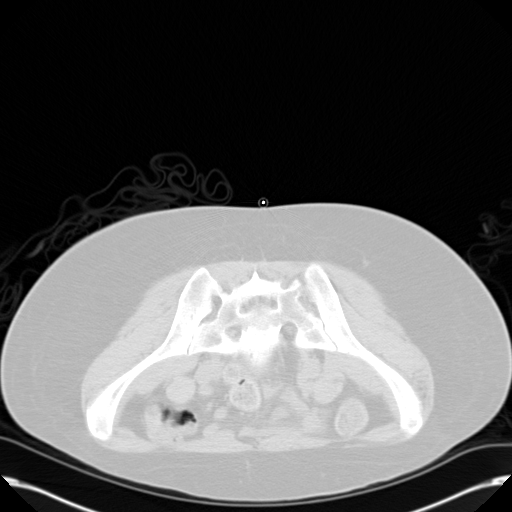

[1 of 22 positions shown; findings below may reference images not displayed]

EXAM:
CT GUIDED BIOPSY OF RIGHT ILIAC BONE MARROW

ANESTHESIA/SEDATION:
Versed 2.0 mg IV, Fentanyl 100 mcg IV

Total Moderate Sedation Time

12 minutes.

PROCEDURE:
The procedure risks, benefits, and alternatives were explained to
the patient. Questions regarding the procedure were encouraged and
answered. The patient understands and consents to the procedure.

The right gluteal region was prepped with Betadine. Sterile gown and
sterile gloves were used for the procedure. Local anesthesia was
provided with 1% Lidocaine.

Under CT guidance, an 11 gauge OnControl bone cutting needle was
advanced from a posterior approach into the right iliac bone. Needle
positioning was confirmed with CT. Initial non heparinized and
heparinized aspirate samples were obtained of bone marrow.

Core biopsy was performed wit through the outer needle.

COMPLICATIONS:
None
FINDINGS: Inspection of initial aspirate did reveal visible particles. An
intact core biopsy sample was obtained.
IMPRESSION: CT guided bone marrow biopsy of right posterior iliac bone with both
aspirate and core samples obtained.

## 2015-01-14 ENCOUNTER — Other Ambulatory Visit: Payer: 59

## 2015-01-14 ENCOUNTER — Ambulatory Visit (HOSPITAL_BASED_OUTPATIENT_CLINIC_OR_DEPARTMENT_OTHER): Payer: 59 | Admitting: Hematology & Oncology

## 2015-01-14 VITALS — BP 103/67 | HR 83 | Temp 97.4°F | Resp 20 | Wt 145.0 lb

## 2015-01-14 DIAGNOSIS — C9000 Multiple myeloma not having achieved remission: Secondary | ICD-10-CM

## 2015-01-14 NOTE — Progress Notes (Signed)
Hematology and Oncology Follow Up Visit  Samantha Moyer [id number] [birth year] 49 y.o. 01/14/2015   Principle Diagnosis:   IgA lambda myeloma  Current Therapy:    Radiation therapy for pathologic fracture of the left humerus  Velcade/Revlimid/Decadron-patient - status post cycle 5  Zometa 4 mg IV every month     Interim History:  Ms.  Moyer is in for follow-up. She is in the process right now of getting her transplant. She is in the workup phase. She will start the Neupogen next week.  Of note, her post chemotherapy bone marrow ops he showed a fantastic response. Even more important is the fact that her sounds and next were normal. Prior to her starting chemotherapy, she had abnormal cytogenetics.  She really feels well. She's not hurting. She is eating well. She's gaining some weight. She's had no problems with nausea or vomiting. She's had no rashes. She's had no leg swelling. She's had no cough or shortness of breath.  Overall, her performance status is ECOG 1.  Medications:  Current outpatient prescriptions:  .  acyclovir (ZOVIRAX) 400 MG tablet, Take 1 tablet (400 mg total) by mouth 2 (two) times daily., Disp: 60 tablet, Rfl: 6 .  amoxicillin (AMOXIL) 500 MG tablet, Take 500 mg by mouth 3 (three) times daily. For 10 days., Disp: , Rfl:  .  Multiple Vitamins-Minerals (ALIVE WOMENS ENERGY) TABS, Take 1 tablet by mouth daily., Disp: , Rfl:  .  aspirin EC 81 MG tablet, Take 81 mg by mouth daily., Disp: , Rfl:   Allergies:  Allergies  Allergen Reactions  . Codeine Nausea Only  . Tramadol Nausea And Vomiting and Other (See Comments)    Past Medical History, Surgical history, Social history, and Family History were reviewed and updated.  Review of Systems: As above  Physical Exam:  weight is 145 lb (65.772 kg). Her oral temperature is 97.4 F (36.3 C). Her blood pressure is 103/67 and her pulse is 83. Her respiration is 20.   Thin but fairly well-nourished  Afro-American female in no obvious distress. Head and neck exam shows no ocular or oral lesions. There are no palpable cervical or supraclavicular lymph nodes. Lungs are clear. Cardiac exam regular rate and rhythm. There are no murmurs, rubs or bruits. Abdomen is soft. She has good bowel sounds. There is no fluid wave. There is no palpable liver or spleen tip. Back exam shows no tenderness over the spine, ribs or hips. Extremities shows decreased range motion of the left arm. There is no edema in the legs. She has no weakness in the legs. Skin exam shows no rashes, ecchymoses or petechia. Neurological exam is nonfocal.  Lab Results  Component Value Date   WBC 8.3 12/31/2014   HGB 12.2 12/31/2014   HCT 39.0 12/31/2014   MCV 98 12/31/2014   PLT 359 12/31/2014     Chemistry      Component Value Date/Time   NA 140 12/31/2014 0954   NA 143 12/24/2014 0950   NA 137 10/01/2014 1405   K 3.9 12/31/2014 0954   K 4.4 12/24/2014 0950   K 3.7 10/01/2014 1405   CL 104 12/24/2014 0950   CL 100 10/01/2014 1405   CO2 26 12/31/2014 0954   CO2 31 12/24/2014 0950   CO2 28 10/01/2014 1405   BUN 8.1 12/31/2014 0954   BUN 11 12/24/2014 0950   BUN 8 10/01/2014 1405   CREATININE 0.8 12/31/2014 0954   CREATININE 0.7 12/24/2014 0950   CREATININE  0.59 10/01/2014 1405      Component Value Date/Time   CALCIUM 9.4 12/31/2014 0954   CALCIUM 9.3 12/24/2014 0950   CALCIUM 9.5 10/01/2014 1405   ALKPHOS 93 12/31/2014 0954   ALKPHOS 76 12/24/2014 0950   ALKPHOS 97 10/01/2014 1405   AST 17 12/31/2014 0954   AST 20 12/24/2014 0950   AST 13 10/01/2014 1405   ALT 8 12/31/2014 0954   ALT 13 12/24/2014 0950   ALT 10 10/01/2014 1405   BILITOT 0.43 12/31/2014 0954   BILITOT 0.50 12/24/2014 0950   BILITOT 0.3 10/01/2014 1405         Impression and Plan: Samantha Moyer is 49 year old African American female. She has fairly aggressive IgA lambda myeloma. She has 2 chromosomal abnormalities. She has a chromosome  13 deletion and a t(4:14) translocation.  She's responded very nicely to date. She is now in the process of getting ready for transplant. She has been seen at [facility]. They will start her on Neupogen next week. She gets a Hickman catheter placed in a couple days.  We will plan to see her back after she has her transplant. We have more and happy to run labs on her. I am sure she will require maintenance therapy. This Will Be with Revlimid with or without Velcade.  I answered all her questions. I tried to answer things regarding the transplant. I'm sure that she will do very well.  We will plan to get her back when she returns from [facility].  I spent about 30 minutes with her.Volanda Napoleon, MD 5/25/20166:07 PM

## 2015-01-21 ENCOUNTER — Telehealth: Payer: Self-pay | Admitting: Hematology & Oncology

## 2015-01-21 NOTE — Telephone Encounter (Signed)
Faxed Short-Term Disability papers to [employer] today.   Attn: Doristine Johns - Benefit Specialist   [phone] fx  [phone] ph     JUNE

## 2015-02-16 ENCOUNTER — Telehealth: Payer: Self-pay | Admitting: Hematology & Oncology

## 2015-02-16 ENCOUNTER — Telehealth: Payer: Self-pay | Admitting: *Deleted

## 2015-02-16 ENCOUNTER — Encounter: Payer: Self-pay | Admitting: Hematology & Oncology

## 2015-02-16 DIAGNOSIS — I7300 Raynaud's syndrome without gangrene: Secondary | ICD-10-CM | POA: Insufficient documentation

## 2015-02-16 NOTE — Telephone Encounter (Signed)
Pt requested an appt due to further testing in [place] and wants to check in with [employer] after all testing is complete to verify if she can return to work. Schedule appt for 7/25

## 2015-02-16 NOTE — Telephone Encounter (Signed)
Patient wants to speak with Dr Marin Olp regarding her transplant plan from [facility]. She saw a pulmonologist recently who wants her to start weekly cytoxan and velcade for 12 weeks. Her transplant is currently on hold. All this reported to Dr Marin Olp who will try to open Care Everywhere later today to get a sense of what her current treatment plan is, and then assess how soon we need to see the patient.

## 2015-02-17 ENCOUNTER — Ambulatory Visit: Payer: Self-pay | Admitting: Emergency Medicine

## 2015-02-17 ENCOUNTER — Ambulatory Visit (INDEPENDENT_AMBULATORY_CARE_PROVIDER_SITE_OTHER): Payer: 59 | Admitting: Emergency Medicine

## 2015-02-17 ENCOUNTER — Encounter: Payer: Self-pay | Admitting: Emergency Medicine

## 2015-02-17 VITALS — BP 103/70 | HR 91 | Temp 98.0°F | Resp 16 | Ht 64.5 in | Wt 144.4 lb

## 2015-02-17 DIAGNOSIS — C9000 Multiple myeloma not having achieved remission: Secondary | ICD-10-CM

## 2015-02-17 DIAGNOSIS — M7502 Adhesive capsulitis of left shoulder: Secondary | ICD-10-CM | POA: Diagnosis not present

## 2015-02-17 NOTE — Progress Notes (Signed)
   Subjective:    Patient ID: Samantha Moyer, female    DOB: Jun 16, 1966, 49 y.o.   MRN: [id number] This chart was scribed for Arlyss Queen, MD by Zola Button, Medical Scribe. This patient was seen in room 23 and the patient's care was started at 2:47 PM.   HPI HPI Comments: Samantha Moyer is a 49 y.o. female with a history of scleroderma and multiple myeloma who presents to the [facility] for a follow-up.   Patient had a surgery of her left shoulder done in November, 2015 by Dr. Novella Olive for a lesion. She still has some difficulty raising up her left shoulder and requests physical therapy. She is currently on remission for multiple myeloma. She was supposed to have a stem cell transplant at [facility], but Dr. 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 Patient believes chemotherapy worsened her lungs as she noticed her breathing was worse after undergoing chemotherapy previously. She did see pulmonologist yesterday at [facility], who recommended to have her receive two chemotherapy drugs, Velcade and Cytoxan, intravenously every week. She feels uneasy about this and wants to see her oncologist here, Dr. Marin Olp, for his opinion.   Review of Systems     Objective:   Physical Exam CONSTITUTIONAL: Well developed/well nourished. Alert and cooperative. HEAD: Normocephalic/atraumatic EYES: EOM/PERRL ENMT: Mucous membranes moist NECK: supple no meningeal signs SPINE: entire spine nontender CV: S1/S2 noted, no murmurs/rubs/gallops noted. Regular rate. LUNGS: Decreased breath sounds in both bases ABDOMEN: soft, nontender, no rebound or guarding GU: no cva tenderness NEURO: Pt is awake/alert, moves all extremitiesx4 EXTREMITIES: Unable to abduct left arm past 45 degrees SKIN: warm, color normal.  Dressing over the right anterior chest where they removed her PICC line. PSYCH: no abnormalities of mood noted      Assessment & Plan:  1. Frozen shoulder syndrome, left  - Ambulatory referral to Physical Therapy  2. Multiple myeloma Patient was referred by Dr. Sherral Hammers at [facility] to see if she would be a candidate for stem cell Transplant. 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 She is very hesitant to take these medications. I told her she should discuss this with Dr. Theodis Blaze. She does not want to take any other chemotherapeutic drugs if she does not have to. She was told she was in remission I do not have the records from the pulmonary specialist. I told her I would help get her an appointment to see Dr. Marin Olp so she can discuss this with him prior to starting on meds - Ambulatory referral to Oncology   I personally performed the services described in this documentation, which was scribed in my presence. The recorded information has been reviewed and is accurate.  Arlyss Queen, MD  [facility], [facility]  02/18/2015 9:02 AM

## 2015-02-19 ENCOUNTER — Ambulatory Visit: Payer: Self-pay | Admitting: Emergency Medicine

## 2015-02-26 ENCOUNTER — Telehealth: Payer: Self-pay | Admitting: Hematology & Oncology

## 2015-02-26 NOTE — Telephone Encounter (Signed)
Faxed Short-Term Disability papers to [employer] today.   Attn: Doristine Johns - Benefit Specialist   [phone] fx  [phone] ph     JULY

## 2015-03-13 IMAGING — US US EXTREM  UP VENOUS*L*
1 series · 13 of 24 positions shown · non-contrast
Comparison: None.

CLINICAL DATA: Left upper arm pain, history of multiple myeloma



[Series 1: us extrem up venous*left* · 0.05mm/px · 13 of 40 slices shown]
[im 1/40]
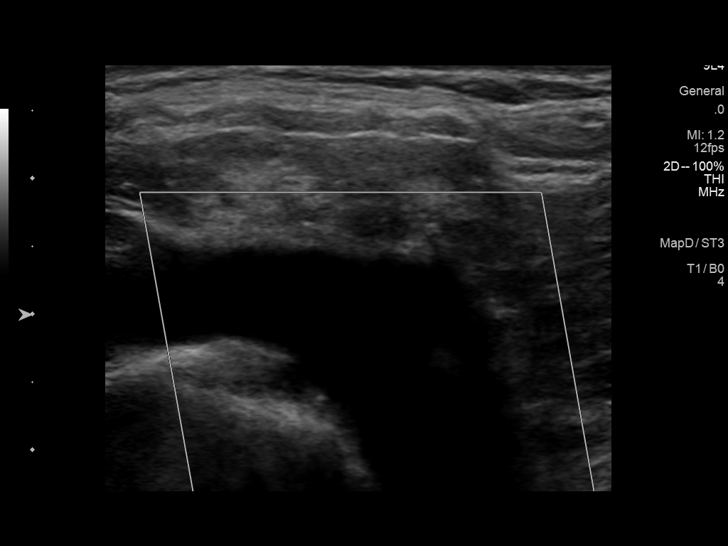
[im 4/40]
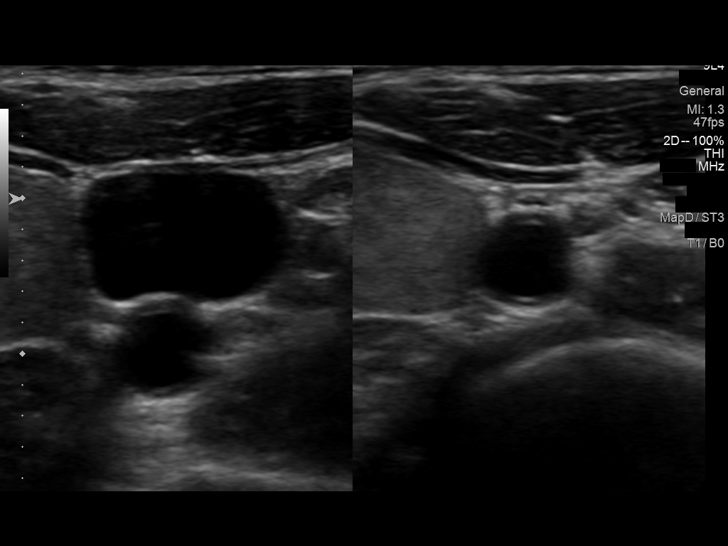
[im 7/40]
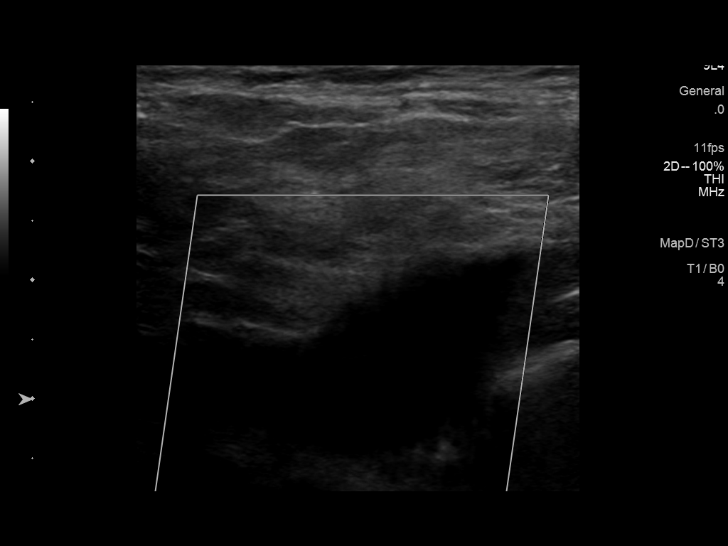
[im 11/40]
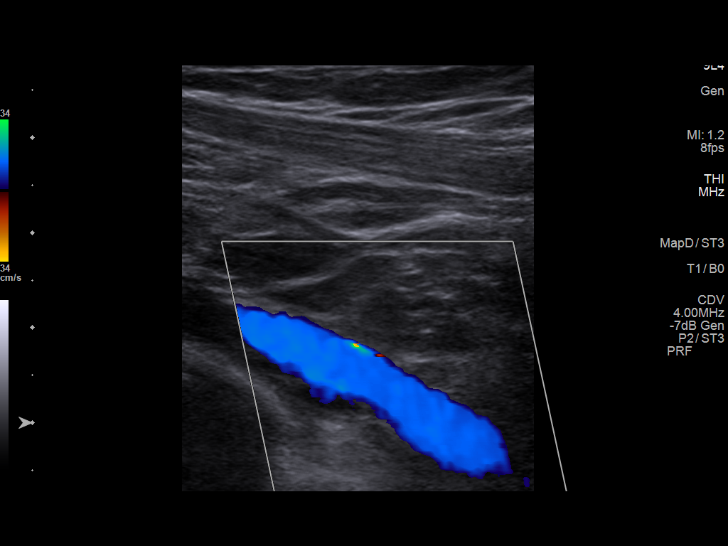
[im 14/40]
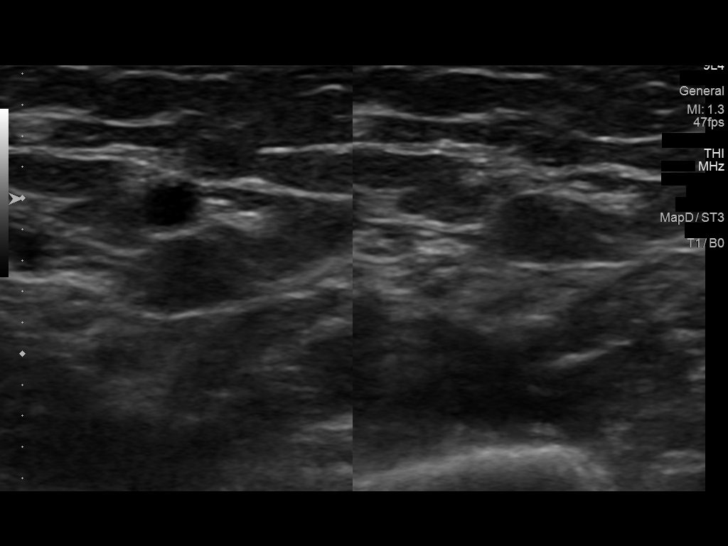
[im 17/40]
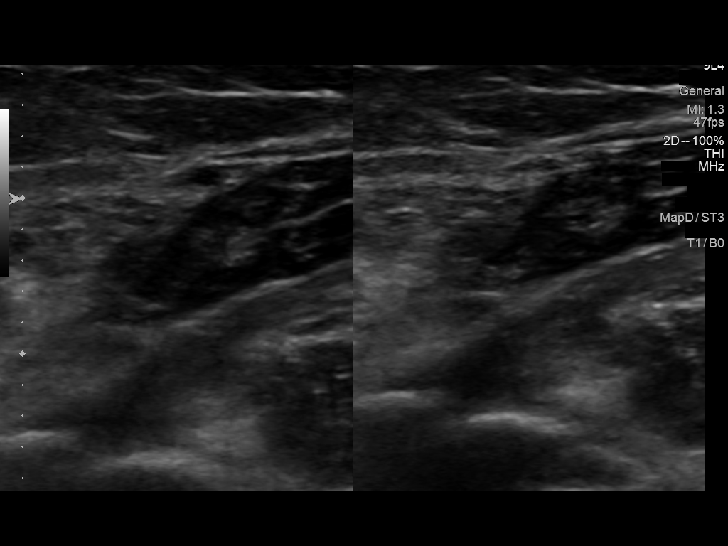
[im 21/40]
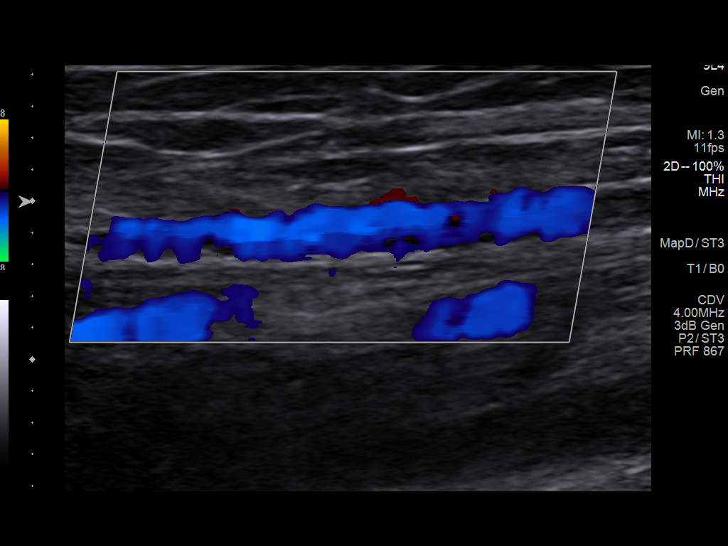
[im 23/40]
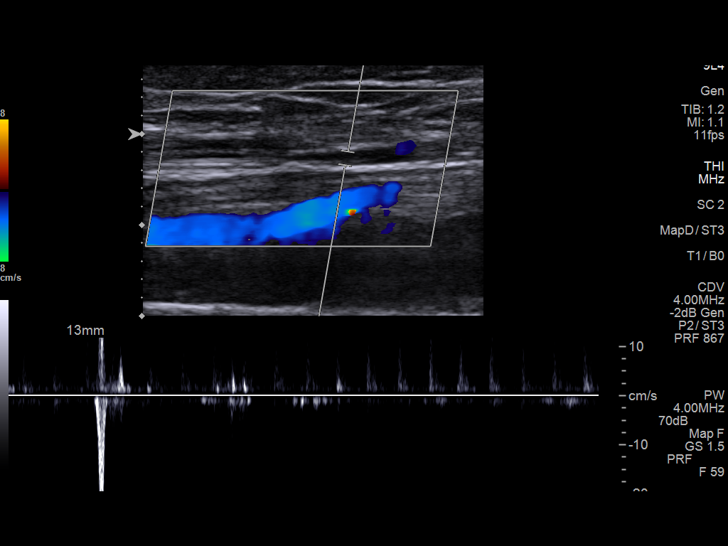
[im 26/40]
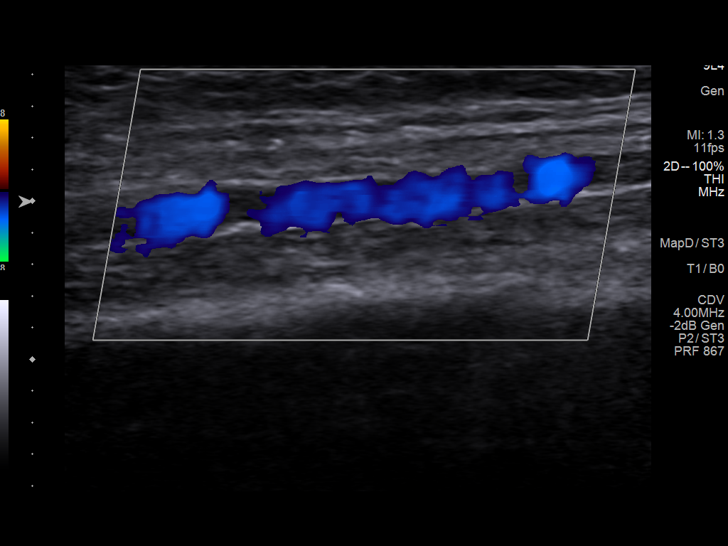
[im 29/40]
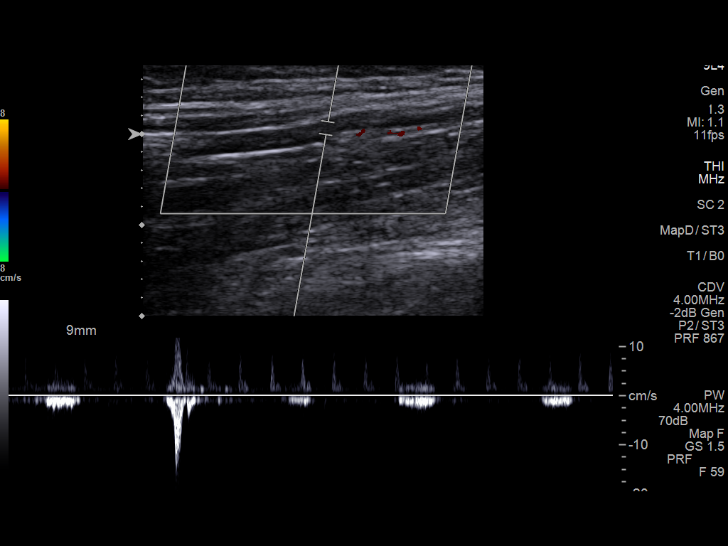
[im 33/40]
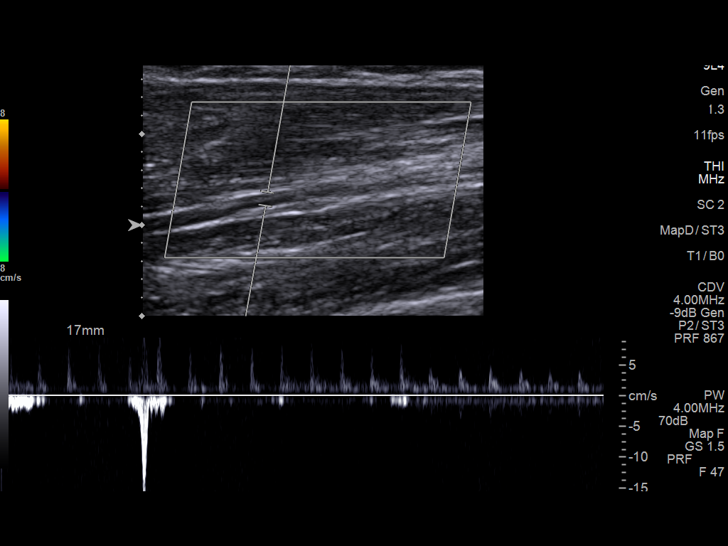
[im 36/40]
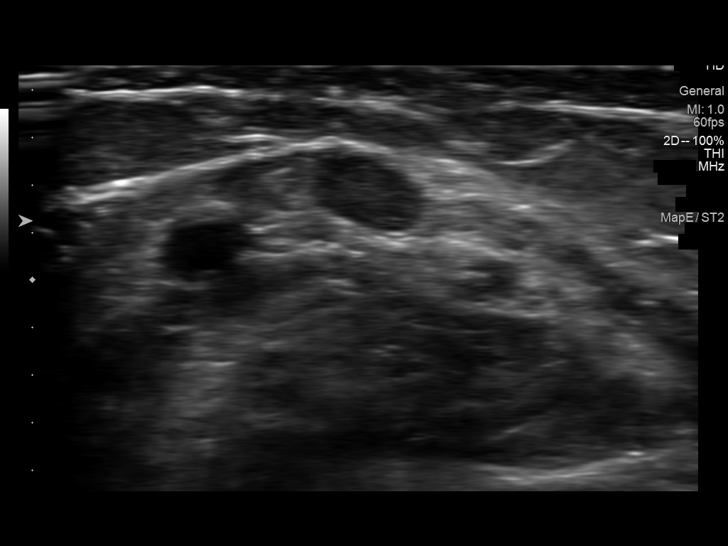
[im 40/40]
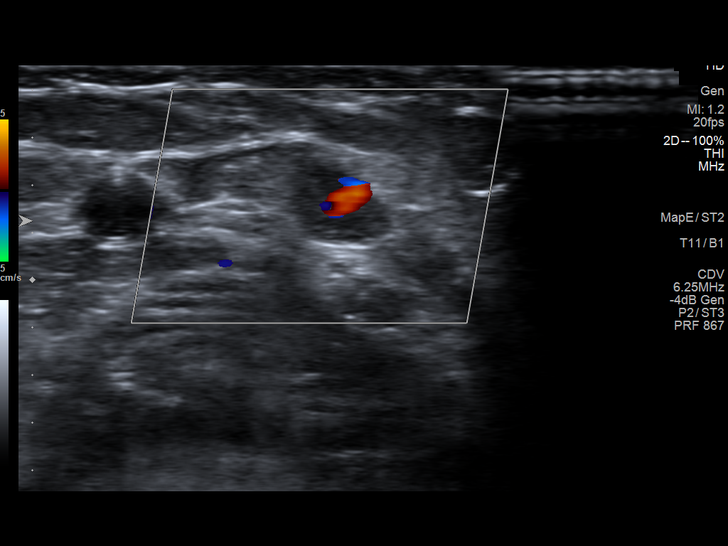

[13 of 24 positions shown; findings below may reference images not displayed]

FINDINGS: Contralateral Subclavian Vein: Respiratory phasicity is normal and
symmetric with the symptomatic side. No evidence of thrombus. Normal
compressibility.

Internal Jugular Vein: No evidence of thrombus. Normal
compressibility, respiratory phasicity and response to augmentation.

Subclavian Vein: No evidence of thrombus. Normal compressibility,
respiratory phasicity and response to augmentation.

Axillary Vein: No evidence of thrombus. Normal compressibility,
respiratory phasicity and response to augmentation.

Cephalic Vein: No evidence of thrombus. Normal compressibility,
respiratory phasicity and response to augmentation.

Basilic Vein: No evidence of thrombus. Normal compressibility,
respiratory phasicity and response to augmentation.

Brachial Veins: No evidence of thrombus. Normal compressibility,
respiratory phasicity and response to augmentation.

Radial Veins: No evidence of thrombus. Normal compressibility,
respiratory phasicity and response to augmentation.

Ulnar Veins: No evidence of thrombus. Normal compressibility,
respiratory phasicity and response to augmentation.

Venous Reflux:  None visualized.

Other Findings: A rounded 5 mm hypoechoic structure is noted in the
antecubital fossa likely representing small lymph node. It
demonstrates internal vascular
IMPRESSION: No evidence of deep venous thrombosis.

Likely small lymph node in the antecubital fossa.

## 2015-03-16 ENCOUNTER — Other Ambulatory Visit (HOSPITAL_BASED_OUTPATIENT_CLINIC_OR_DEPARTMENT_OTHER): Payer: 59

## 2015-03-16 ENCOUNTER — Ambulatory Visit (HOSPITAL_BASED_OUTPATIENT_CLINIC_OR_DEPARTMENT_OTHER): Payer: 59 | Admitting: Hematology & Oncology

## 2015-03-16 ENCOUNTER — Encounter: Payer: Self-pay | Admitting: Hematology & Oncology

## 2015-03-16 VITALS — BP 122/71 | HR 80 | Temp 98.0°F | Ht 64.0 in | Wt 143.0 lb

## 2015-03-16 DIAGNOSIS — C9000 Multiple myeloma not having achieved remission: Secondary | ICD-10-CM | POA: Diagnosis not present

## 2015-03-16 LAB — CBC WITH DIFFERENTIAL (CANCER CENTER ONLY)
BASO#: 0 10*3/uL (ref 0.0–0.2)
BASO%: 0.1 % (ref 0.0–2.0)
EOS%: 0.6 % (ref 0.0–7.0)
Eosinophils Absolute: 0.1 10*3/uL (ref 0.0–0.5)
HCT: 39.6 % (ref 34.8–46.6)
HEMOGLOBIN: 12.4 g/dL (ref 11.6–15.9)
LYMPH#: 1.1 10*3/uL (ref 0.9–3.3)
LYMPH%: 10.8 % — ABNORMAL LOW (ref 14.0–48.0)
MCH: 30.7 pg (ref 26.0–34.0)
MCHC: 31.3 g/dL — ABNORMAL LOW (ref 32.0–36.0)
MCV: 98 fL (ref 81–101)
MONO#: 1.1 10*3/uL — ABNORMAL HIGH (ref 0.1–0.9)
MONO%: 11.2 % (ref 0.0–13.0)
NEUT#: 7.8 10*3/uL — ABNORMAL HIGH (ref 1.5–6.5)
NEUT%: 77.3 % (ref 39.6–80.0)
PLATELETS: 344 10*3/uL (ref 145–400)
RBC: 4.04 10*6/uL (ref 3.70–5.32)
RDW: 13.1 % (ref 11.1–15.7)
WBC: 10 10*3/uL (ref 3.9–10.0)

## 2015-03-16 LAB — COMPREHENSIVE METABOLIC PANEL
ALT: 9 U/L (ref 6–29)
AST: 17 U/L (ref 10–35)
Albumin: 4.2 g/dL (ref 3.6–5.1)
Alkaline Phosphatase: 85 U/L (ref 33–130)
BILIRUBIN TOTAL: 0.3 mg/dL (ref 0.2–1.2)
BUN: 8 mg/dL (ref 7–25)
CO2: 27 mmol/L (ref 20–31)
Calcium: 10.2 mg/dL (ref 8.6–10.4)
Chloride: 101 mmol/L (ref 98–110)
Creatinine, Ser: 0.68 mg/dL (ref 0.50–1.05)
GLUCOSE: 77 mg/dL (ref 65–99)
POTASSIUM: 4.8 mmol/L (ref 3.5–5.3)
Sodium: 140 mmol/L (ref 135–146)
Total Protein: 7.4 g/dL (ref 6.1–8.1)

## 2015-03-17 NOTE — Progress Notes (Signed)
Hematology and Oncology Follow Up Visit  Samantha Moyer [id number] 09-Aug-1966 49 y.o. 03/17/2015   Principle Diagnosis:   IgA lambda myeloma  Current Therapy:    Radiation therapy for pathologic fracture of the left humerus  Velcade/Revlimid/Decadron-patient - status post cycle 5  Zometa 4 mg IV every month     Interim History:  Samantha Moyer is in for follow-up. Apparently,  She will not be having a stem cell transplant. There was some issue with her and scleroderma and lung problems. There was a lack of consensus with the doctors at [facility] as to whether or not she have a transplant. Ultimately, it looked like she was not felt to be medically  eligible for transplant.  It was felt that she needed to be back on chemotherapy. She really does not want to be on chemotherapy right now.  She wants go back to work.  She feels well. She's had no pain issues. She's had no fever. She's had no cough or shortness of breath. There's been no nausea or vomiting.  She is eating okay. She's had no change in bowel or bladder habits.  Her left arm is doing well. She is doing some physical therapy for that.   Again, I talked her about what needs to be done. Even though she does not want therapy right now, I'm sure that she will have a relapse that we'll clearly need to be treated.  We last checked her myeloma studies, she had 2 monoclonal spikes. One measured 0.3 g/dL. The other measured 0.2 g/dL.  Her IgA level was 302 mg/dL. Her lambda light chain was 7.9 mg/dL.   She's had no mass was. She's had no visual changes.  Overall, her performance status is ECOG 1.  Medications:  Current outpatient prescriptions:  Marland Kitchen  Multiple Vitamins-Minerals (ALIVE WOMENS ENERGY) TABS, Take 1 tablet by mouth daily., Disp: , Rfl:   Allergies:  Allergies  Allergen Reactions  . Codeine Nausea Only  . Tramadol Nausea And Vomiting and Other (See Comments)    Past Medical History, Surgical history, Social  history, and Family History were reviewed and updated.  Review of Systems: As above  Physical Exam:  height is 5' 4"  (1.626 m) and weight is 143 lb (64.864 kg). Her oral temperature is 98 F (36.7 C). Her blood pressure is 122/71 and her pulse is 80.    well-nourished Afro-American female in no obvious distress. Head and neck exam shows no ocular or oral lesions. There are no palpable cervical or supraclavicular lymph nodes. Lungs are clear. Cardiac exam regular rate and rhythm. There are no murmurs, rubs or bruits. Abdomen is soft. She has good bowel sounds. There is no fluid wave. There is no palpable liver or spleen tip. Back exam shows no tenderness over the spine, ribs or hips. Extremities shows decreased range motion of the left arm. There is no edema in the legs. She has no weakness in the legs. Skin exam shows no rashes, ecchymoses or petechia. Neurological exam is nonfocal.  Lab Results  Component Value Date   WBC 10.0 03/16/2015   HGB 12.4 03/16/2015   HCT 39.6 03/16/2015   MCV 98 03/16/2015   PLT 344 03/16/2015     Chemistry      Component Value Date/Time   NA 140 03/16/2015 1126   NA 140 12/31/2014 0954   NA 143 12/24/2014 0950   K 4.8 03/16/2015 1126   K 3.9 12/31/2014 0954   K 4.4 12/24/2014 0950  CL 101 03/16/2015 1126   CL 104 12/24/2014 0950   CO2 27 03/16/2015 1126   CO2 26 12/31/2014 0954   CO2 31 12/24/2014 0950   BUN 8 03/16/2015 1126   BUN 8.1 12/31/2014 0954   BUN 11 12/24/2014 0950   CREATININE 0.68 03/16/2015 1126   CREATININE 0.8 12/31/2014 0954   CREATININE 0.7 12/24/2014 0950      Component Value Date/Time   CALCIUM 10.2 03/16/2015 1126   CALCIUM 9.4 12/31/2014 0954   CALCIUM 9.3 12/24/2014 0950   ALKPHOS 85 03/16/2015 1126   ALKPHOS 93 12/31/2014 0954   ALKPHOS 76 12/24/2014 0950   AST 17 03/16/2015 1126   AST 17 12/31/2014 0954   AST 20 12/24/2014 0950   ALT 9 03/16/2015 1126   ALT 8 12/31/2014 0954   ALT 13 12/24/2014 0950   BILITOT  0.3 03/16/2015 1126   BILITOT 0.43 12/31/2014 0954   BILITOT 0.50 12/24/2014 0950         Impression and Plan: Samantha Moyer is 49 year old African American female. She has fairly aggressive IgA lambda myeloma. 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  thankfully, with her post treatment bone marrow biopsy, she had normal cytogenetics.  We will have to see what her myeloma studies are right now.  Again, I suspect that she probably will need to have therapy. Hopefully, she will still be considered for a stem cell transplant She has never had any pulmonary symptoms. She's never had shortness of breath.  She really is looking forward to going back to work. I gave her a note for her to go back to work.  We will need to continue her on Zometa. I will set this up for next week. I want to see her back in one month.  I spent about 30 minutes with her.Volanda Napoleon, MD 7/26/20167:19 AM

## 2015-03-18 ENCOUNTER — Encounter: Payer: Self-pay | Admitting: *Deleted

## 2015-03-23 ENCOUNTER — Telehealth: Payer: Self-pay | Admitting: Hematology & Oncology

## 2015-03-23 NOTE — Telephone Encounter (Signed)
Faxed Short-Term Disability papers to [employer] today.   Attn: Doristine Johns - Benefit Specialist   [phone] fx  [phone] ph     AUGUST

## 2015-03-27 ENCOUNTER — Other Ambulatory Visit: Payer: 59

## 2015-03-27 ENCOUNTER — Ambulatory Visit: Payer: 59

## 2015-04-06 ENCOUNTER — Other Ambulatory Visit: Payer: 59

## 2015-04-06 ENCOUNTER — Ambulatory Visit: Payer: 59

## 2015-04-06 ENCOUNTER — Ambulatory Visit: Payer: 59 | Admitting: Hematology & Oncology

## 2015-04-06 ENCOUNTER — Telehealth: Payer: Self-pay | Admitting: Hematology & Oncology

## 2015-04-06 NOTE — Telephone Encounter (Signed)
Pt lt mess regarding cancelling all appt and tx at this time.

## 2015-05-26 ENCOUNTER — Encounter: Payer: Self-pay | Admitting: Emergency Medicine

## 2015-08-07 ENCOUNTER — Ambulatory Visit (INDEPENDENT_AMBULATORY_CARE_PROVIDER_SITE_OTHER): Payer: BC Managed Care – PPO | Admitting: Family Medicine

## 2015-08-07 VITALS — BP 88/60 | HR 70 | Temp 98.1°F | Resp 16 | Ht 64.5 in | Wt 144.0 lb

## 2015-08-07 DIAGNOSIS — R05 Cough: Secondary | ICD-10-CM | POA: Diagnosis not present

## 2015-08-07 DIAGNOSIS — R059 Cough, unspecified: Secondary | ICD-10-CM

## 2015-08-07 DIAGNOSIS — J069 Acute upper respiratory infection, unspecified: Secondary | ICD-10-CM

## 2015-08-07 MED ORDER — HYDROCODONE-HOMATROPINE 5-1.5 MG/5ML PO SYRP: 5.0000 mL | ORAL_SOLUTION | Freq: Four times a day (QID) | ORAL | Status: DC | PRN

## 2015-08-07 MED ORDER — AZITHROMYCIN 250 MG PO TABS: ORAL_TABLET | ORAL | Status: DC

## 2015-08-07 NOTE — Patient Instructions (Signed)
Please use cough syrup at night as needed Can use saline nasal spray or afrin nasal spray (only use afrin for 3 days) Return to clinic if you get fever over 101, shortness of breath or wheezing. Start antibiotic if you are not better in 2-3 days.   Upper Respiratory Infection, Adult Most upper respiratory infections (URIs) are a viral infection of the air passages leading to the lungs. A URI affects the nose, throat, and upper air passages. The most common type of URI is nasopharyngitis and is typically referred to as "the common cold." URIs run their course and usually go away on their own. Most of the time, a URI does not require medical attention, but sometimes a bacterial infection in the upper airways can follow a viral infection. This is called a secondary infection. Sinus and middle ear infections are common types of secondary upper respiratory infections. Bacterial pneumonia can also complicate a URI. A URI can worsen asthma and chronic obstructive pulmonary disease (COPD). Sometimes, these complications can require emergency medical care and may be life threatening.  CAUSES Almost all URIs are caused by viruses. A virus is a type of germ and can spread from one person to another.  RISKS FACTORS You may be at risk for a URI if:   You smoke.   You have chronic heart or lung disease.  You have a weakened defense (immune) system.   You are very young or very old.   You have nasal allergies or asthma.  You work in crowded or poorly ventilated areas.  You work in health care facilities or schools. SIGNS AND SYMPTOMS  Symptoms typically develop 2-3 days after you come in contact with a cold virus. Most viral URIs last 7-10 days. However, viral URIs from the influenza virus (flu virus) can last 14-18 days and are typically more severe. Symptoms may include:   Runny or stuffy (congested) nose.   Sneezing.   Cough.   Sore throat.   Headache.   Fatigue.   Fever.    Loss of appetite.   Pain in your forehead, behind your eyes, and over your cheekbones (sinus pain).  Muscle aches.  DIAGNOSIS  Your health care provider may diagnose a URI by:  Physical exam.  Tests to check that your symptoms are not due to another condition such as:  Strep throat.  Sinusitis.  Pneumonia.  Asthma. TREATMENT  A URI goes away on its own with time. It cannot be cured with medicines, but medicines may be prescribed or recommended to relieve symptoms. Medicines may help:  Reduce your fever.  Reduce your cough.  Relieve nasal congestion. HOME CARE INSTRUCTIONS   Take medicines only as directed by your health care provider.   Gargle warm saltwater or take cough drops to comfort your throat as directed by your health care provider.  Use a warm mist humidifier or inhale steam from a shower to increase air moisture. This may make it easier to breathe.  Drink enough fluid to keep your urine clear or pale yellow.   Eat soups and other clear broths and maintain good nutrition.   Rest as needed.   Return to work when your temperature has returned to normal or as your health care provider advises. You may need to stay home longer to avoid infecting others. You can also use a face mask and careful hand washing to prevent spread of the virus.  Increase the usage of your inhaler if you have asthma.   Do not  use any tobacco products, including cigarettes, chewing tobacco, or electronic cigarettes. If you need help quitting, ask your health care provider. PREVENTION  The best way to protect yourself from getting a cold is to practice good hygiene.   Avoid oral or hand contact with people with cold symptoms.   Wash your hands often if contact occurs.  There is no clear evidence that vitamin C, vitamin E, echinacea, or exercise reduces the chance of developing a cold. However, it is always recommended to get plenty of rest, exercise, and practice good  nutrition.  SEEK MEDICAL CARE IF:   You are getting worse rather than better.   Your symptoms are not controlled by medicine.   You have chills.  You have worsening shortness of breath.  You have brown or red mucus.  You have yellow or brown nasal discharge.  You have pain in your face, especially when you bend forward.  You have a fever.  You have swollen neck glands.  You have pain while swallowing.  You have white areas in the back of your throat. SEEK IMMEDIATE MEDICAL CARE IF:   You have severe or persistent:  Headache.  Ear pain.  Sinus pain.  Chest pain.  You have chronic lung disease and any of the following:  Wheezing.  Prolonged cough.  Coughing up blood.  A change in your usual mucus.  You have a stiff neck.  You have changes in your:  Vision.  Hearing.  Thinking.  Mood. MAKE SURE YOU:   Understand these instructions.  Will watch your condition.  Will get help right away if you are not doing well or get worse.   This information is not intended to replace advice given to you by your health care provider. Make sure you discuss any questions you have with your health care provider.   Document Released: 02/01/2001 Document Revised: 12/23/2014 Document Reviewed: 11/13/2013 Elsevier Interactive Patient Education [employer].

## 2015-08-07 NOTE — Progress Notes (Signed)
Subjective:    Patient ID: Samantha Moyer, female    DOB: 05/05/1966, 49 y.o.   MRN: [id number]  HPI This is a  Pleasant 49 yo female who presents today with 5 day history of cold symptoms- felt cold, has developed cough (foamy, yellow and clear), nasal drainage (clear to yellow). Took nyqil last night and slept well. She thinks she was febrile the first two days. Suspects it was low grade. No headaches, no sore throat, no SOB, no dizziness or lightheadedness. No chest pain or tightness. Most bothersome symptom is cough. Cough worse after sitting for long time. Is able to prevent coughing with drinking liquids. She is going out of town next week and is concerned she won't be well for travel. She feels "fine." She has seasonal allergies and occasional nasal congestion.   She has a history of IgA Myeloma. Last chemo was in spring. She sees Dr. Marin Olp who has wanted her to continue therapy to prevent relapse. She has gotten additional opinions at [facility] where she did not qualify for bone marrow transplant. She has chosen to not have further therapy at this time because she feels fine and has side effects with chemo.   Past Medical History  Diagnosis Date  . Scleroderma ([place])     Tobie Lords  . IgA myeloma ([place]) 07/21/2014  . Raynaud disease   . Radiation 07/31/14-08/13/14    left humerus/shoulder 30 Gy   Past Surgical History  Procedure Laterality Date  . Nasal septum surgery    . Uterine fibroid embolization     Family History  Problem Relation Age of Onset  . Heart disease Mother   . Diabetes Mother   . Hypertension Mother   . Hyperlipidemia Mother   . Cancer Brother    Social History  Substance Use Topics  . Smoking status: Never Smoker   . Smokeless tobacco: Never Used     Comment: NEVER USED TOBACCO  . Alcohol Use: No    Review of Systems Per HPI     Objective:   Physical Exam  Constitutional: She is oriented to person, place, and time. She appears  well-developed and well-nourished.  HENT:  Head: Normocephalic.  Right Ear: Tympanic membrane, external ear and ear canal normal.  Left Ear: Tympanic membrane and external ear normal.  Nose: Mucosal edema and rhinorrhea present. Right sinus exhibits no maxillary sinus tenderness and no frontal sinus tenderness. Left sinus exhibits no maxillary sinus tenderness and no frontal sinus tenderness.  Mouth/Throat: Uvula is midline, oropharynx is clear and moist and mucous membranes are normal.  Eyes: Conjunctivae are normal.  Neck: Normal range of motion. Neck supple.  Cardiovascular: Normal rate, regular rhythm and normal heart sounds.   Patient reports that her blood pressure is always low. She is asymptomatic.   Pulmonary/Chest: Effort normal and breath sounds normal. No respiratory distress. She has no wheezes. She has no rales.  Musculoskeletal: Normal range of motion.  Neurological: She is alert and oriented to person, place, and time.  Skin: Skin is warm and dry.  Psychiatric: She has a normal mood and affect. Her behavior is normal. Judgment and thought content normal.  Vitals reviewed. BP 88/60 mmHg  Pulse 70  Temp(Src) 98.1 F (36.7 C) (Oral)  Resp 16  Ht 5' 4.5" (1.638 m)  Wt 144 lb (65.318 kg)  BMI 24.34 kg/m2  SpO2      Assessment & Plan:  1. Cough - HYDROcodone-homatropine (HYCODAN) 5-1.5 MG/5ML syrup; Take 5 mLs  by mouth every 6 (six) hours as needed for cough.  Dispense: 120 mL; Refill: 0  2. Acute upper respiratory infection - suspect viral process, provided written and verbal instructions for symptomatic relief and wait and see prescription for antibiotic if she is not better in 2-3 days.  - azithromycin (ZITHROMAX) 250 MG tablet; Take two tablets today, then one tablet daily until finished  Dispense: 6 tablet; Refill: 0 - increase fluids, rest, RTC if she develops fever over 101, SOB/wheeze  Clarene Reamer, FNP-BC  [facility], [facility]  08/08/2015 10:15 AM

## 2015-08-20 ENCOUNTER — Other Ambulatory Visit: Payer: Self-pay | Admitting: Nurse Practitioner

## 2016-02-15 ENCOUNTER — Telehealth: Payer: Self-pay | Admitting: Emergency Medicine

## 2016-02-15 ENCOUNTER — Ambulatory Visit (INDEPENDENT_AMBULATORY_CARE_PROVIDER_SITE_OTHER): Payer: BC Managed Care – PPO | Admitting: Emergency Medicine

## 2016-02-15 ENCOUNTER — Ambulatory Visit (INDEPENDENT_AMBULATORY_CARE_PROVIDER_SITE_OTHER): Payer: BC Managed Care – PPO

## 2016-02-15 VITALS — BP 102/68 | HR 92 | Temp 97.4°F | Resp 18 | Ht 64.5 in | Wt 146.2 lb

## 2016-02-15 DIAGNOSIS — M349 Systemic sclerosis, unspecified: Secondary | ICD-10-CM | POA: Diagnosis not present

## 2016-02-15 DIAGNOSIS — C9001 Multiple myeloma in remission: Secondary | ICD-10-CM | POA: Diagnosis not present

## 2016-02-15 DIAGNOSIS — Z0000 Encounter for general adult medical examination without abnormal findings: Secondary | ICD-10-CM

## 2016-02-15 DIAGNOSIS — Z23 Encounter for immunization: Secondary | ICD-10-CM | POA: Diagnosis not present

## 2016-02-15 DIAGNOSIS — I517 Cardiomegaly: Secondary | ICD-10-CM

## 2016-02-15 LAB — POCT CBC
Granulocyte percent: 71 %G (ref 37–80)
HEMATOCRIT: 36.1 % — AB (ref 37.7–47.9)
HEMOGLOBIN: 12.3 g/dL (ref 12.2–16.2)
LYMPH, POC: 2.8 (ref 0.6–3.4)
MCH, POC: 30.6 pg (ref 27–31.2)
MCHC: 33.9 g/dL (ref 31.8–35.4)
MCV: 90.4 fL (ref 80–97)
MID (cbc): 0.2 (ref 0–0.9)
MPV: 7.3 fL (ref 0–99.8)
PLATELET COUNT, POC: 267 10*3/uL (ref 142–424)
POC GRANULOCYTE: 7.4 — AB (ref 2–6.9)
POC LYMPH %: 27.4 % (ref 10–50)
POC MID %: 1.6 %M (ref 0–12)
RBC: 4 M/uL — AB (ref 4.04–5.48)
RDW, POC: 14.6 %
WBC: 10.4 10*3/uL — AB (ref 4.6–10.2)

## 2016-02-15 LAB — POCT URINALYSIS DIP (MANUAL ENTRY)
BILIRUBIN UA: NEGATIVE
Bilirubin, UA: NEGATIVE
Glucose, UA: NEGATIVE
LEUKOCYTES UA: NEGATIVE
NITRITE UA: NEGATIVE
PH UA: 5.5
PROTEIN UA: NEGATIVE
Spec Grav, UA: 1.015
UROBILINOGEN UA: 0.2

## 2016-02-15 MED ORDER — TYPHOID VACCINE PO CPDR: 1.0000 | DELAYED_RELEASE_CAPSULE | ORAL | Status: AC

## 2016-02-15 NOTE — Progress Notes (Addendum)
Subjective:  This chart was scribed for Arlyss Queen MD, by Tamsen Roers, at [facility].  This patient was seen in room 4 and the patient's care was started at 12:53 PM.   Chief Complaint  Patient presents with  . Annual Exam    CPE with travel immunizations     Patient ID: Samantha Moyer, female    DOB: [birth year], 50 y.o.   MRN: [id number]  HPI HPI Comments: Samantha Moyer is a 50 y.o. female with a history of IgA myeloma who presents to the [facility] for an annual physical exam.  Patient is currently exercising 3-4 times a week and is using vitamins.  Patient has been off of chemotherapy since April of 2016.  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 She has gained weight and is happy with what she weighs.   Patient had her mammogram last year and is up to date with her colonoscopy.  She will be getting a pap smear today.She saw a doctor for her shoulder recently and was told that everything looked good.    Patient Active Problem List   Diagnosis Date Noted  . Paroxysmal digital cyanosis 02/16/2015  . Protein-calorie malnutrition, severe ([place]) 08/08/2014  . Shoulder pain   . Sepsis ([place]) 08/03/2014  . Fever 08/03/2014  . Myeloma ([place])   . IgA myeloma ([place]) 07/21/2014  . Lesion of left shoulder 07/03/2014  . 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 Anemia 07/03/2014  . Abnormal serum total protein level 07/03/2014   Past Medical History  Diagnosis Date  . Scleroderma ([place])     Tobie Lords  . IgA myeloma ([place]) 07/21/2014  . Raynaud disease   . Radiation 07/31/14-08/13/14    left humerus/shoulder 30 Gy   Past Surgical History  Procedure Laterality Date  . Nasal septum surgery    . Uterine fibroid embolization     Allergies  Allergen Reactions  . Codeine Nausea Only  . Tramadol Nausea And  Vomiting and Other (See Comments)   Prior to Admission medications   Medication Sig Start Date End Date Taking? Authorizing Provider  Multiple Vitamins-Minerals (ALIVE WOMENS ENERGY) TABS Take 1 tablet by mouth daily.   Yes Historical Provider, MD   Social History   Social History  . Marital Status: Divorced    Spouse Name: N/A  . Number of Children: 0  . Years of Education: N/A   Occupational History  . education    Social History Main Topics  . Smoking status: Never Smoker   . Smokeless tobacco: Never Used     Comment: NEVER USED TOBACCO  . Alcohol Use: No  . Drug Use: No  . Sexual Activity: Not on file   Other Topics Concern  . Not on file   Social History Narrative    Review of Systems  Constitutional: Negative for fever and chills.  Eyes: Negative for pain, redness and itching.  Respiratory: Negative for cough, choking and shortness of breath.   Gastrointestinal: Negative for nausea and vomiting.  Musculoskeletal: Negative for neck pain and neck stiffness.  Neurological: Negative for syncope and speech difficulty.       Objective:   Physical Exam Filed Vitals:   02/15/16 1131  BP: 102/68  Pulse: 92  Temp: 97.4 F (36.3 C)  TempSrc: Oral  Resp: 18  Height: 5'  4.5" (1.638 m)  Weight: 146 lb 3.2 oz (66.316 kg)  SpO2: 96%    CONSTITUTIONAL: Well developed/well nourished HEAD: Normocephalic/atraumatic EYES: EOMI/PERRL ENMT: Mucous membranes moist, upper dentures.  NECK: supple no meningeal signs SPINE/BACK:entire spine nontender CV: regular rate and rhythm.  LUNGS: dry rales in both bases.  ABDOMEN: soft GU:no cva tenderness NEURO: Pt is awake/alert/appropriate, moves all extremitiesx4.  No facial droop.   EXTREMITIES:hyperpigmentation changes around left shoulder with decreased range of motion to full abduction.   SKIN: warm, color normal PSYCH: no abnormalities of mood noted, alert and oriented to situation Dg Chest 2 View  02/15/2016  CLINICAL  DATA:  History of scleroderma, myeloma, sepsis, routine checkup EXAM: CHEST  2 VIEW COMPARISON:  Chest x-ray of October 04, 2013. FINDINGS: The lungs are well-expanded. The interstitial markings remain increased bilaterally. There is no alveolar infiltrate. The heart is mildly enlarged though stable. The pulmonary vascularity is not engorged. There is calcification in the wall of the aortic arch. The patient has undergone previous ORIF for a pathologic fracture of the proximal left humerus. IMPRESSION: Chronic interstitial prominence consistent with known scleroderma. There does not appear to been significant progression since the previous study. There is stable cardiomegaly without pulmonary vascular congestion. There is aortic atherosclerosis. Electronically Signed   By: David  Martinique M.D.   On: 02/15/2016 14:08   Results for orders placed or performed in visit on 02/15/16  POCT CBC  Result Value Ref Range   WBC 10.4 (A) 4.6 - 10.2 K/uL   Lymph, poc 2.8 0.6 - 3.4   POC LYMPH PERCENT 27.4 10 - 50 %L   MID (cbc) 0.2 0 - 0.9   POC MID % 1.6 0 - 12 %M   POC Granulocyte 7.4 (A) 2 - 6.9   Granulocyte percent 71.0 37 - 80 %G   RBC 4.00 (A) 4.04 - 5.48 M/uL   Hemoglobin 12.3 12.2 - 16.2 g/dL   HCT, POC 36.1 (A) 37.7 - 47.9 %   MCV 90.4 80 - 97 fL   MCH, POC 30.6 27 - 31.2 pg   MCHC 33.9 31.8 - 35.4 g/dL   RDW, POC 14.6 %   Platelet Count, POC 267 142 - 424 K/uL   MPV 7.3 0 - 99.8 fL  POCT urinalysis dipstick  Result Value Ref Range   Color, UA yellow yellow   Clarity, UA clear clear   Glucose, UA negative negative   Bilirubin, UA negative negative   Ketones, POC UA negative negative   Spec Grav, UA 1.015    Blood, UA trace-lysed (A) negative   pH, UA 5.5    Protein Ur, POC negative negative   Urobilinogen, UA 0.2    Nitrite, UA Negative Negative   Leukocytes, UA Negative Negative         Assessment & Plan:  Patient has rales both lungs which are dry and apparently stable by chest  x-ray. Her multiple myeloma is in remission. She plans to work in [place]. Will update her vaccines with hepatitis A, Tdap and typhoid.I personally performed the services described in this documentation, which was scribed in my presence. The recorded information has been reviewed and is accurate patient also has an enlarged heart on chest x-ray. Referral made to cardiology for their opinion.Darlyne Russian, MD

## 2016-02-15 NOTE — Patient Instructions (Signed)
     IF you received an x-ray today, you will receive an invoice from [facility]. Please contact [facility] at [phone] with questions or concerns regarding your invoice.   IF you received labwork today, you will receive an invoice from [facility]. Please contact [facility] at [phone] with questions or concerns regarding your invoice.   Our billing staff will not be able to assist you with questions regarding bills from these companies.  You will be contacted with the lab results as soon as they are available. The fastest way to get your results is to activate your My Chart account. Instructions are located on the last page of this paperwork. If you have not heard from us regarding the results in 2 weeks, please contact this office.      

## 2016-02-15 NOTE — Telephone Encounter (Signed)
Please call patient and let her know I also made an appointment for her to see a cardiologist for her enlarged heart seen on chest x-ray. This has been stable. I wanted her to see him before she went overseas especially with her history of scleroderma.

## 2016-02-15 NOTE — Progress Notes (Signed)
PELVIC EXAM: Normal female external genitalia without lesions. Cervix appears normal, without discharge or lesion. Pap test obtained. No vaginal lesions. Bimanual exam reveals no CMT, adnexal fullness or tenderness, no uterine fullness or tenderness.

## 2016-02-16 LAB — COMPLETE METABOLIC PANEL WITH GFR
ALT: 8 U/L (ref 6–29)
AST: 18 U/L (ref 10–35)
Albumin: 3.7 g/dL (ref 3.6–5.1)
Alkaline Phosphatase: 73 U/L (ref 33–130)
BILIRUBIN TOTAL: 0.3 mg/dL (ref 0.2–1.2)
BUN: 14 mg/dL (ref 7–25)
CALCIUM: 9.6 mg/dL (ref 8.6–10.4)
CHLORIDE: 101 mmol/L (ref 98–110)
CO2: 28 mmol/L (ref 20–31)
CREATININE: 0.69 mg/dL (ref 0.50–1.05)
GFR, Est Non African American: >89 mL/min (ref >=60)
Glucose, Bld: 82 mg/dL (ref 65–99)
Potassium: 4.3 mmol/L (ref 3.5–5.3)
Sodium: 138 mmol/L (ref 135–146)
TOTAL PROTEIN: 8 g/dL (ref 6.1–8.1)

## 2016-02-16 LAB — LIPID PANEL
CHOLESTEROL: 193 mg/dL (ref 125–200)
HDL: 31 mg/dL — ABNORMAL LOW (ref >=46)
LDL CALC: 127 mg/dL (ref <130)
Total CHOL/HDL Ratio: 6.2 Ratio — ABNORMAL HIGH (ref <=5.0)
Triglycerides: 177 mg/dL — ABNORMAL HIGH (ref <150)
VLDL: 35 mg/dL — AB (ref <30)

## 2016-02-16 LAB — TSH: TSH: 1.04 m[IU]/L

## 2016-02-16 LAB — PAP IG W/ RFLX HPV ASCU

## 2016-02-16 NOTE — Telephone Encounter (Signed)
Patient notified

## 2016-02-17 LAB — PROTEIN ELECTROPHORESIS, SERUM
ABNORMAL PROTEIN BAND2: 0.3 g/dL
ALBUMIN ELP: 3.6 g/dL — AB (ref 3.8–4.8)
Abnormal Protein Band1: 1.4 g/dL
Abnormal Protein Band3: NOT DETECTED g/dL
Alpha-1-Globulin: 0.3 g/dL (ref 0.2–0.3)
Alpha-2-Globulin: 0.8 g/dL (ref 0.5–0.9)
BETA 2: 0.4 g/dL (ref 0.2–0.5)
Beta Globulin: 1.9 g/dL — ABNORMAL HIGH (ref 0.4–0.6)
GAMMA GLOBULIN: 1 g/dL (ref 0.8–1.7)
TOTAL PROTEIN, SERUM ELECTROPHOR: 8 g/dL (ref 6.1–8.1)

## 2016-02-17 LAB — IMMUNOFIXATION ELECTROPHORESIS
IGA: 1896 mg/dL — AB (ref 81–463)
IGG (IMMUNOGLOBIN G), SERUM: 1204 mg/dL (ref 694–1618)
IgM, Serum: 40 mg/dL — ABNORMAL LOW (ref 48–271)

## 2016-02-17 LAB — ANTI-NUCLEAR AB-TITER (ANA TITER): ANA Titer 1: 1:1280 {titer} — ABNORMAL HIGH

## 2016-02-17 LAB — ANA: Anti Nuclear Antibody(ANA): POSITIVE — AB

## 2016-02-19 ENCOUNTER — Telehealth: Payer: Self-pay | Admitting: Emergency Medicine

## 2016-02-19 NOTE — Telephone Encounter (Signed)
Please call patient and let her know she needs to see Dr. Marin Olp regarding her recent labs that were done for a follow-up of her myeloma.

## 2016-02-22 ENCOUNTER — Encounter: Payer: Self-pay | Admitting: Hematology & Oncology

## 2016-02-22 ENCOUNTER — Other Ambulatory Visit (HOSPITAL_BASED_OUTPATIENT_CLINIC_OR_DEPARTMENT_OTHER): Payer: BC Managed Care – PPO

## 2016-02-22 ENCOUNTER — Ambulatory Visit (HOSPITAL_BASED_OUTPATIENT_CLINIC_OR_DEPARTMENT_OTHER): Payer: BC Managed Care – PPO | Admitting: Hematology & Oncology

## 2016-02-22 VITALS — BP 95/68 | HR 86 | Temp 98.0°F | Resp 18 | Ht 64.5 in | Wt 146.0 lb

## 2016-02-22 DIAGNOSIS — C9000 Multiple myeloma not having achieved remission: Secondary | ICD-10-CM

## 2016-02-22 LAB — COMPREHENSIVE METABOLIC PANEL (CC13)
ALT: 8 IU/L (ref 0–32)
AST (SGOT): 18 IU/L (ref 0–40)
Albumin, Serum: 3.8 g/dL (ref 3.5–5.5)
Albumin/Globulin Ratio: 0.9 — ABNORMAL LOW (ref 1.2–2.2)
Alkaline Phosphatase, S: 88 IU/L (ref 39–117)
BUN/Creatinine Ratio: 18 (ref 9–23)
BUN: 12 mg/dL (ref 6–24)
Bilirubin Total: <0.2 mg/dL (ref 0.0–1.2)
CALCIUM: 9.7 mg/dL (ref 8.7–10.2)
CHLORIDE: 98 mmol/L (ref 96–106)
Carbon Dioxide, Total: 25 mmol/L (ref 18–29)
Creatinine, Ser: 0.67 mg/dL (ref 0.57–1.00)
GFR, EST AFRICAN AMERICAN: 117 mL/min/{1.73_m2} (ref >59)
GFR, EST NON AFRICAN AMERICAN: 101 mL/min/{1.73_m2} (ref >59)
GLUCOSE: 91 mg/dL (ref 65–99)
Globulin, Total: 4.3 g/dL (ref 1.5–4.5)
Potassium, Ser: 4 mmol/L (ref 3.5–5.2)
Sodium: 130 mmol/L — ABNORMAL LOW (ref 134–144)
TOTAL PROTEIN: 8.1 g/dL (ref 6.0–8.5)

## 2016-02-22 LAB — CBC WITH DIFFERENTIAL (CANCER CENTER ONLY)
BASO#: 0 10*3/uL (ref 0.0–0.2)
BASO%: 0 % (ref 0.0–2.0)
EOS ABS: 0.2 10*3/uL (ref 0.0–0.5)
EOS%: 1.5 % (ref 0.0–7.0)
HCT: 38.3 % (ref 34.8–46.6)
HGB: 12 g/dL (ref 11.6–15.9)
LYMPH#: 2.6 10*3/uL (ref 0.9–3.3)
LYMPH%: 26.5 % (ref 14.0–48.0)
MCH: 30.4 pg (ref 26.0–34.0)
MCHC: 31.3 g/dL — AB (ref 32.0–36.0)
MCV: 97 fL (ref 81–101)
MONO#: 0.9 10*3/uL (ref 0.1–0.9)
MONO%: 8.6 % (ref 0.0–13.0)
NEUT#: 6.3 10*3/uL (ref 1.5–6.5)
NEUT%: 63.4 % (ref 39.6–80.0)
PLATELETS: 301 10*3/uL (ref 145–400)
RBC: 3.95 10*6/uL (ref 3.70–5.32)
RDW: 12.7 % (ref 11.1–15.7)
WBC: 9.9 10*3/uL (ref 3.9–10.0)

## 2016-02-22 NOTE — Telephone Encounter (Signed)
She is there now according to Epic.

## 2016-02-22 NOTE — Progress Notes (Signed)
Hematology and Oncology Follow Up Visit  Samantha Moyer [id number] 1966-05-31 50 y.o. 02/22/2016   Principle Diagnosis:   IgA lambda myeloma  Current Therapy:    Radiation therapy for pathologic fracture of the left humerus  Velcade/Revlimid/Decadron-patient - status post cycle 5  Zometa 4 mg IV every month     Interim History:  Samantha Moyer is in for follow-up. Apparently,  She will not be having a stem cell transplant. There was some issue with her and scleroderma and lung problems. There was a lack of consensus with the doctors at [facility] as to whether or not she have a transplant. Ultimately, it looked like she was not felt to be medically  eligible for transplant.  I am very glad for her for the fact that she is supposed to be going over to [place] for work. 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  We have not seen her for about a year. Last time we saw her back in May 2016, her SPEP showed a M spike of 0.3 g/dL. Her IgA level was 302 mg/dL. Her lambda light chain was 7.9 mg/dL.  She feels well. She does not hurt. Her left arm is fairly mobile. She had the pathologic fracture of the humerus that was fixed and she had radiation.  She's had no cough or shortness of breath. She does have some scleroderma. She was not felt to be a transplant candidate because of this.  She has not had any chemotherapy for over a year. She last had chemotherapy back in May 2016.  She really is looking forward to this adventure. She really feels that this is where God wants her to be.  Overall, her performance status is ECOG 1.  Medications:  Current outpatient prescriptions:  Marland Kitchen  Multiple Vitamins-Minerals (ALIVE WOMENS ENERGY) TABS, Take 1 tablet by mouth daily., Disp: , Rfl:  .  typhoid (VIVOTIF) DR capsule, Take 1 capsule by mouth every other day., Disp: 4 capsule, Rfl: 0  Allergies:  Allergies  Allergen Reactions  . Codeine Nausea Only  . Tramadol Nausea And Vomiting  and Other (See Comments)    Past Medical History, Surgical history, Social history, and Family History were reviewed and updated.  Review of Systems: As above  Physical Exam:  height is 5' 4.5" (1.638 m) and weight is 146 lb (66.225 kg). Her oral temperature is 98 F (36.7 C). Her blood pressure is 95/68 and her pulse is 86. Her respiration is 18.    well-nourished Afro-American female in no obvious distress. Head and neck exam shows no ocular or oral lesions. There are no palpable cervical or supraclavicular lymph nodes. Lungs are clear. Cardiac exam regular rate and rhythm. There are no murmurs, rubs or bruits. Abdomen is soft. She has good bowel sounds. There is no fluid wave. There is no palpable liver or spleen tip. Back exam shows no tenderness over the spine, ribs or hips. Extremities shows decreased range motion of the left arm. There is no edema in the legs. She has no weakness in the legs. Skin exam shows no rashes, ecchymoses or petechia. Neurological exam is nonfocal.  Lab Results  Component Value Date   WBC 9.9 02/22/2016   HGB 12.0 02/22/2016   HCT 38.3 02/22/2016   MCV 97 02/22/2016   PLT 301 02/22/2016     Chemistry      Component Value Date/Time   NA 138 02/15/2016 1350   NA 140 12/31/2014 0954  NA 143 12/24/2014 0950   K 4.3 02/15/2016 1350   K 3.9 12/31/2014 0954   K 4.4 12/24/2014 0950   CL 101 02/15/2016 1350   CL 104 12/24/2014 0950   CO2 28 02/15/2016 1350   CO2 26 12/31/2014 0954   CO2 31 12/24/2014 0950   BUN 14 02/15/2016 1350   BUN 8.1 12/31/2014 0954   BUN 11 12/24/2014 0950   CREATININE 0.69 02/15/2016 1350   CREATININE 0.68 03/16/2015 1126   CREATININE 0.8 12/31/2014 0954      Component Value Date/Time   CALCIUM 9.6 02/15/2016 1350   CALCIUM 9.4 12/31/2014 0954   CALCIUM 9.3 12/24/2014 0950   ALKPHOS 73 02/15/2016 1350   ALKPHOS 93 12/31/2014 0954   ALKPHOS 76 12/24/2014 0950   AST 18 02/15/2016 1350   AST 17 12/31/2014 0954   AST 20  12/24/2014 0950   ALT 8 02/15/2016 1350   ALT 8 12/31/2014 0954   ALT 13 12/24/2014 0950   BILITOT 0.3 02/15/2016 1350   BILITOT 0.43 12/31/2014 0954   BILITOT 0.50 12/24/2014 0950         Impression and Plan: Samantha Moyer is 50 year old African American female. She has fairly aggressive IgA lambda myeloma. 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  I really like to see her go over to [place]. 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  He will be in very interesting to see what her myeloma studies look like. She has not had chemotherapy for a year. She really should have had a transplant but was not a candidate. She did not want any further chemotherapy.   I know that they have excellent medical facilities in [place].  I think if myeloma is going to be an issue, she will be oh to be treated over there.  Again, she really feels that this is her calling. I do not want to ruin this for her.   I spent about 30 minutes with her.  I prayed for her.  I'm sure that we will see her back in the future.    Volanda Napoleon, MD 7/3/20173:37 PM

## 2016-02-23 LAB — IGG, IGA, IGM
IgA, Qn, Serum: 1736 mg/dL — ABNORMAL HIGH (ref 87–352)
IgG, Qn, Serum: 1007 mg/dL (ref 700–1600)
IgM, Qn, Serum: 37 mg/dL (ref 26–217)

## 2016-02-24 LAB — KAPPA/LAMBDA LIGHT CHAINS
IG KAPPA FREE LIGHT CHAIN: 15 mg/L (ref 3.3–19.4)
IG LAMBDA FREE LIGHT CHAIN: 976.8 mg/L — AB (ref 5.7–26.3)
Kappa/Lambda FluidC Ratio: 0.02 — ABNORMAL LOW (ref 0.26–1.65)

## 2016-02-26 LAB — PROTEIN ELECTROPHORESIS, SERUM, WITH REFLEX
A/G Ratio: 0.8 (ref 0.7–1.7)
ALPHA 1: 0.2 g/dL (ref 0.0–0.4)
ALPHA 2: 0.8 g/dL (ref 0.4–1.0)
Albumin: 3.4 g/dL (ref 2.9–4.4)
BETA: 2.3 g/dL — AB (ref 0.7–1.3)
GLOBULIN, TOTAL: 4.4 g/dL — AB (ref 2.2–3.9)
Gamma Globulin: 1 g/dL (ref 0.4–1.8)
Interpretation(See Below): 0
M-Spike, %: 1.4 g/dL — ABNORMAL HIGH
Total Protein: 7.8 g/dL (ref 6.0–8.5)

## 2016-03-05 ENCOUNTER — Ambulatory Visit (INDEPENDENT_AMBULATORY_CARE_PROVIDER_SITE_OTHER): Payer: BC Managed Care – PPO | Admitting: Emergency Medicine

## 2016-03-05 DIAGNOSIS — Z23 Encounter for immunization: Secondary | ICD-10-CM

## 2016-03-05 NOTE — Progress Notes (Signed)
PT. Here for Tdap only visit she is going out of the country and needed this immunization

## 2016-03-05 NOTE — Patient Instructions (Signed)
     IF you received an x-ray today, you will receive an invoice from [facility]. Please contact [facility] at [phone] with questions or concerns regarding your invoice.   IF you received labwork today, you will receive an invoice from [facility]. Please contact [facility] at [phone] with questions or concerns regarding your invoice.   Our billing staff will not be able to assist you with questions regarding bills from these companies.  You will be contacted with the lab results as soon as they are available. The fastest way to get your results is to activate your My Chart account. Instructions are located on the last page of this paperwork. If you have not heard from us regarding the results in 2 weeks, please contact this office.      

## 2019-05-23 DEATH — deceased
